# Patient Record
Sex: Female | Born: 1935 | Race: White | Hispanic: No | Marital: Married | Smoking: Former smoker
Health system: Southern US, Community
[De-identification: ages and names within clinical notes are randomized; demographics above are authoritative.]

## PROBLEM LIST (undated history)

## (undated) DIAGNOSIS — J449 Chronic obstructive pulmonary disease, unspecified: Secondary | ICD-10-CM

## (undated) DIAGNOSIS — M810 Age-related osteoporosis without current pathological fracture: Secondary | ICD-10-CM

## (undated) DIAGNOSIS — K279 Peptic ulcer, site unspecified, unspecified as acute or chronic, without hemorrhage or perforation: Secondary | ICD-10-CM

## (undated) DIAGNOSIS — F329 Major depressive disorder, single episode, unspecified: Secondary | ICD-10-CM

## (undated) DIAGNOSIS — J4489 Other specified chronic obstructive pulmonary disease: Secondary | ICD-10-CM

## (undated) DIAGNOSIS — F32A Depression, unspecified: Secondary | ICD-10-CM

## (undated) DIAGNOSIS — S4291XA Fracture of right shoulder girdle, part unspecified, initial encounter for closed fracture: Secondary | ICD-10-CM

## (undated) DIAGNOSIS — K5732 Diverticulitis of large intestine without perforation or abscess without bleeding: Secondary | ICD-10-CM

## (undated) DIAGNOSIS — Z9889 Other specified postprocedural states: Secondary | ICD-10-CM

## (undated) DIAGNOSIS — Z8679 Personal history of other diseases of the circulatory system: Secondary | ICD-10-CM

## (undated) DIAGNOSIS — K5730 Diverticulosis of large intestine without perforation or abscess without bleeding: Secondary | ICD-10-CM

## (undated) DIAGNOSIS — D6800 Von Willebrand disease, unspecified: Secondary | ICD-10-CM

## (undated) DIAGNOSIS — I509 Heart failure, unspecified: Secondary | ICD-10-CM

## (undated) DIAGNOSIS — E785 Hyperlipidemia, unspecified: Secondary | ICD-10-CM

## (undated) DIAGNOSIS — I10 Essential (primary) hypertension: Secondary | ICD-10-CM

## (undated) DIAGNOSIS — Z8719 Personal history of other diseases of the digestive system: Secondary | ICD-10-CM

## (undated) DIAGNOSIS — K635 Polyp of colon: Secondary | ICD-10-CM

## (undated) DIAGNOSIS — I2510 Atherosclerotic heart disease of native coronary artery without angina pectoris: Secondary | ICD-10-CM

## (undated) DIAGNOSIS — J45909 Unspecified asthma, uncomplicated: Secondary | ICD-10-CM

## (undated) DIAGNOSIS — K219 Gastro-esophageal reflux disease without esophagitis: Secondary | ICD-10-CM

## (undated) HISTORY — DX: Personal history of other diseases of the circulatory system: Z86.79

## (undated) HISTORY — PX: OTHER SURGICAL HISTORY: SHX169

## (undated) HISTORY — DX: Gastro-esophageal reflux disease without esophagitis: K21.9

## (undated) HISTORY — DX: Von Willebrand's disease: D68.0

## (undated) HISTORY — DX: Major depressive disorder, single episode, unspecified: F32.9

## (undated) HISTORY — DX: Chronic obstructive pulmonary disease, unspecified: J44.9

## (undated) HISTORY — DX: Other specified postprocedural states: Z98.890

## (undated) HISTORY — DX: Hyperlipidemia, unspecified: E78.5

## (undated) HISTORY — DX: Other specified chronic obstructive pulmonary disease: J44.89

## (undated) HISTORY — DX: Diverticulosis of large intestine without perforation or abscess without bleeding: K57.30

## (undated) HISTORY — DX: Von Willebrand disease, unspecified: D68.00

## (undated) HISTORY — DX: Age-related osteoporosis without current pathological fracture: M81.0

## (undated) HISTORY — DX: Depression, unspecified: F32.A

## (undated) HISTORY — DX: Personal history of other diseases of the digestive system: Z87.19

## (undated) HISTORY — DX: Atherosclerotic heart disease of native coronary artery without angina pectoris: I25.10

## (undated) HISTORY — DX: Essential (primary) hypertension: I10

---

## 1963-06-24 HISTORY — PX: APPENDECTOMY: SHX54

## 1963-06-24 HISTORY — PX: EXPLORATORY LAPAROTOMY: SUR591

## 1969-06-23 HISTORY — PX: CHOLECYSTECTOMY: SHX55

## 1969-06-23 HISTORY — PX: EXPLORATORY LAPAROTOMY: SUR591

## 1989-06-23 DIAGNOSIS — Z8679 Personal history of other diseases of the circulatory system: Secondary | ICD-10-CM

## 1989-06-23 HISTORY — DX: Personal history of other diseases of the circulatory system: Z86.79

## 1992-06-23 DIAGNOSIS — Z9889 Other specified postprocedural states: Secondary | ICD-10-CM

## 1992-06-23 HISTORY — DX: Other specified postprocedural states: Z98.890

## 1992-06-23 HISTORY — PX: CAROTID ENDARTERECTOMY: SUR193

## 1997-11-29 ENCOUNTER — Other Ambulatory Visit: Admission: RE | Admit: 1997-11-29 | Discharge: 1997-11-29 | Payer: Self-pay | Admitting: Cardiology

## 1998-07-23 ENCOUNTER — Other Ambulatory Visit: Admission: RE | Admit: 1998-07-23 | Discharge: 1998-07-23 | Payer: Self-pay | Admitting: *Deleted

## 1999-01-03 ENCOUNTER — Emergency Department (HOSPITAL_COMMUNITY): Admission: EM | Admit: 1999-01-03 | Discharge: 1999-01-03 | Payer: Self-pay | Admitting: Emergency Medicine

## 1999-07-29 ENCOUNTER — Other Ambulatory Visit: Admission: RE | Admit: 1999-07-29 | Discharge: 1999-07-29 | Payer: Self-pay | Admitting: *Deleted

## 2000-01-14 ENCOUNTER — Encounter: Payer: Self-pay | Admitting: *Deleted

## 2000-01-14 ENCOUNTER — Emergency Department (HOSPITAL_COMMUNITY): Admission: EM | Admit: 2000-01-14 | Discharge: 2000-01-14 | Payer: Self-pay | Admitting: *Deleted

## 2000-03-27 ENCOUNTER — Encounter: Admission: RE | Admit: 2000-03-27 | Discharge: 2000-03-27 | Payer: Self-pay

## 2000-05-16 ENCOUNTER — Emergency Department (HOSPITAL_COMMUNITY): Admission: EM | Admit: 2000-05-16 | Discharge: 2000-05-16 | Payer: Self-pay

## 2000-05-19 ENCOUNTER — Inpatient Hospital Stay (HOSPITAL_COMMUNITY): Admission: EM | Admit: 2000-05-19 | Discharge: 2000-05-21 | Payer: Self-pay | Admitting: Critical Care Medicine

## 2000-05-19 ENCOUNTER — Encounter: Payer: Self-pay | Admitting: Critical Care Medicine

## 2000-08-16 ENCOUNTER — Ambulatory Visit (HOSPITAL_BASED_OUTPATIENT_CLINIC_OR_DEPARTMENT_OTHER): Admission: RE | Admit: 2000-08-16 | Discharge: 2000-08-16 | Payer: Self-pay | Admitting: Critical Care Medicine

## 2000-08-19 ENCOUNTER — Other Ambulatory Visit: Admission: RE | Admit: 2000-08-19 | Discharge: 2000-08-19 | Payer: Self-pay | Admitting: *Deleted

## 2000-08-21 ENCOUNTER — Encounter: Payer: Self-pay | Admitting: *Deleted

## 2000-08-21 ENCOUNTER — Encounter: Admission: RE | Admit: 2000-08-21 | Discharge: 2000-08-21 | Payer: Self-pay | Admitting: *Deleted

## 2001-03-05 ENCOUNTER — Encounter: Payer: Self-pay | Admitting: Emergency Medicine

## 2001-03-05 ENCOUNTER — Inpatient Hospital Stay (HOSPITAL_COMMUNITY): Admission: EM | Admit: 2001-03-05 | Discharge: 2001-03-10 | Payer: Self-pay | Admitting: Emergency Medicine

## 2001-03-06 ENCOUNTER — Encounter: Payer: Self-pay | Admitting: Internal Medicine

## 2001-03-09 ENCOUNTER — Encounter: Payer: Self-pay | Admitting: Gastroenterology

## 2001-03-09 ENCOUNTER — Encounter: Payer: Self-pay | Admitting: Pulmonary Disease

## 2001-04-01 ENCOUNTER — Encounter: Admission: RE | Admit: 2001-04-01 | Discharge: 2001-04-01 | Payer: Self-pay

## 2001-04-06 ENCOUNTER — Encounter: Admission: RE | Admit: 2001-04-06 | Discharge: 2001-04-06 | Payer: Self-pay | Admitting: General Surgery

## 2001-04-06 ENCOUNTER — Encounter: Payer: Self-pay | Admitting: General Surgery

## 2001-04-14 ENCOUNTER — Encounter: Payer: Self-pay | Admitting: Internal Medicine

## 2001-04-14 ENCOUNTER — Ambulatory Visit (HOSPITAL_COMMUNITY): Admission: RE | Admit: 2001-04-14 | Discharge: 2001-04-14 | Payer: Self-pay | Admitting: Internal Medicine

## 2001-11-21 ENCOUNTER — Inpatient Hospital Stay (HOSPITAL_COMMUNITY): Admission: EM | Admit: 2001-11-21 | Discharge: 2001-11-23 | Payer: Self-pay

## 2002-04-07 ENCOUNTER — Encounter: Payer: Self-pay | Admitting: General Surgery

## 2002-04-07 ENCOUNTER — Encounter: Admission: RE | Admit: 2002-04-07 | Discharge: 2002-04-07 | Payer: Self-pay | Admitting: General Surgery

## 2002-05-30 ENCOUNTER — Ambulatory Visit (HOSPITAL_COMMUNITY): Admission: RE | Admit: 2002-05-30 | Discharge: 2002-05-30 | Payer: Self-pay | Admitting: Critical Care Medicine

## 2002-05-30 ENCOUNTER — Encounter: Payer: Self-pay | Admitting: Critical Care Medicine

## 2003-05-01 ENCOUNTER — Encounter: Admission: RE | Admit: 2003-05-01 | Discharge: 2003-05-01 | Payer: Self-pay | Admitting: General Surgery

## 2003-05-05 ENCOUNTER — Encounter: Admission: RE | Admit: 2003-05-05 | Discharge: 2003-05-05 | Payer: Self-pay | Admitting: General Surgery

## 2004-01-24 ENCOUNTER — Ambulatory Visit (HOSPITAL_COMMUNITY): Admission: RE | Admit: 2004-01-24 | Discharge: 2004-01-24 | Payer: Self-pay | Admitting: Internal Medicine

## 2004-01-25 ENCOUNTER — Encounter (INDEPENDENT_AMBULATORY_CARE_PROVIDER_SITE_OTHER): Payer: Self-pay | Admitting: Specialist

## 2004-04-14 ENCOUNTER — Inpatient Hospital Stay (HOSPITAL_COMMUNITY): Admission: EM | Admit: 2004-04-14 | Discharge: 2004-04-21 | Payer: Self-pay | Admitting: Emergency Medicine

## 2004-05-01 ENCOUNTER — Ambulatory Visit: Payer: Self-pay | Admitting: Critical Care Medicine

## 2004-06-10 ENCOUNTER — Ambulatory Visit: Payer: Self-pay | Admitting: Critical Care Medicine

## 2004-07-22 ENCOUNTER — Ambulatory Visit: Payer: Self-pay | Admitting: Critical Care Medicine

## 2004-08-23 ENCOUNTER — Ambulatory Visit: Payer: Self-pay | Admitting: Critical Care Medicine

## 2004-09-23 ENCOUNTER — Ambulatory Visit: Payer: Self-pay | Admitting: Critical Care Medicine

## 2004-09-25 ENCOUNTER — Ambulatory Visit: Payer: Self-pay | Admitting: Internal Medicine

## 2004-09-27 ENCOUNTER — Ambulatory Visit (HOSPITAL_COMMUNITY): Admission: RE | Admit: 2004-09-27 | Discharge: 2004-09-27 | Payer: Self-pay | Admitting: Internal Medicine

## 2004-11-01 ENCOUNTER — Ambulatory Visit: Payer: Self-pay | Admitting: Critical Care Medicine

## 2004-11-28 ENCOUNTER — Ambulatory Visit: Payer: Self-pay | Admitting: Critical Care Medicine

## 2005-01-30 ENCOUNTER — Ambulatory Visit: Payer: Self-pay | Admitting: Critical Care Medicine

## 2005-02-19 ENCOUNTER — Ambulatory Visit: Payer: Self-pay | Admitting: Internal Medicine

## 2005-02-27 ENCOUNTER — Ambulatory Visit: Payer: Self-pay | Admitting: Critical Care Medicine

## 2005-03-06 ENCOUNTER — Encounter: Payer: Self-pay | Admitting: Critical Care Medicine

## 2005-03-06 ENCOUNTER — Ambulatory Visit: Admission: RE | Admit: 2005-03-06 | Discharge: 2005-03-06 | Payer: Self-pay | Admitting: Critical Care Medicine

## 2005-04-02 ENCOUNTER — Ambulatory Visit: Payer: Self-pay | Admitting: Critical Care Medicine

## 2005-05-21 ENCOUNTER — Encounter: Admission: RE | Admit: 2005-05-21 | Discharge: 2005-05-21 | Payer: Self-pay | Admitting: General Surgery

## 2005-05-28 ENCOUNTER — Ambulatory Visit: Payer: Self-pay | Admitting: Critical Care Medicine

## 2005-06-26 ENCOUNTER — Ambulatory Visit: Payer: Self-pay | Admitting: Critical Care Medicine

## 2005-08-07 ENCOUNTER — Ambulatory Visit: Payer: Self-pay | Admitting: Critical Care Medicine

## 2005-10-23 ENCOUNTER — Ambulatory Visit: Payer: Self-pay | Admitting: Critical Care Medicine

## 2005-12-08 ENCOUNTER — Ambulatory Visit: Payer: Self-pay | Admitting: Critical Care Medicine

## 2006-02-04 ENCOUNTER — Ambulatory Visit: Payer: Self-pay | Admitting: Critical Care Medicine

## 2006-03-06 ENCOUNTER — Ambulatory Visit: Payer: Self-pay | Admitting: Critical Care Medicine

## 2006-03-24 ENCOUNTER — Ambulatory Visit: Payer: Self-pay | Admitting: Critical Care Medicine

## 2006-04-23 ENCOUNTER — Ambulatory Visit: Payer: Self-pay | Admitting: Critical Care Medicine

## 2006-05-22 ENCOUNTER — Encounter: Admission: RE | Admit: 2006-05-22 | Discharge: 2006-05-22 | Payer: Self-pay | Admitting: General Surgery

## 2006-06-29 ENCOUNTER — Ambulatory Visit: Payer: Self-pay | Admitting: Critical Care Medicine

## 2006-08-28 ENCOUNTER — Ambulatory Visit: Payer: Self-pay | Admitting: Critical Care Medicine

## 2006-10-12 ENCOUNTER — Ambulatory Visit: Payer: Self-pay | Admitting: Critical Care Medicine

## 2006-11-05 ENCOUNTER — Ambulatory Visit: Payer: Self-pay | Admitting: Critical Care Medicine

## 2006-12-08 ENCOUNTER — Ambulatory Visit: Payer: Self-pay | Admitting: Critical Care Medicine

## 2007-02-08 ENCOUNTER — Ambulatory Visit: Payer: Self-pay | Admitting: Critical Care Medicine

## 2007-03-25 DIAGNOSIS — D680 Von Willebrand's disease: Secondary | ICD-10-CM

## 2007-03-25 DIAGNOSIS — K219 Gastro-esophageal reflux disease without esophagitis: Secondary | ICD-10-CM

## 2007-03-25 DIAGNOSIS — I2510 Atherosclerotic heart disease of native coronary artery without angina pectoris: Secondary | ICD-10-CM | POA: Insufficient documentation

## 2007-03-25 DIAGNOSIS — I119 Hypertensive heart disease without heart failure: Secondary | ICD-10-CM

## 2007-03-25 DIAGNOSIS — Z860100 Personal history of colon polyps, unspecified: Secondary | ICD-10-CM | POA: Insufficient documentation

## 2007-03-25 DIAGNOSIS — J4489 Other specified chronic obstructive pulmonary disease: Secondary | ICD-10-CM | POA: Insufficient documentation

## 2007-03-25 DIAGNOSIS — Z8679 Personal history of other diseases of the circulatory system: Secondary | ICD-10-CM | POA: Insufficient documentation

## 2007-03-25 DIAGNOSIS — K5730 Diverticulosis of large intestine without perforation or abscess without bleeding: Secondary | ICD-10-CM | POA: Insufficient documentation

## 2007-03-25 DIAGNOSIS — J449 Chronic obstructive pulmonary disease, unspecified: Secondary | ICD-10-CM

## 2007-03-25 DIAGNOSIS — M810 Age-related osteoporosis without current pathological fracture: Secondary | ICD-10-CM | POA: Insufficient documentation

## 2007-03-25 DIAGNOSIS — Z9089 Acquired absence of other organs: Secondary | ICD-10-CM | POA: Insufficient documentation

## 2007-03-30 ENCOUNTER — Ambulatory Visit: Payer: Self-pay | Admitting: Critical Care Medicine

## 2007-06-11 ENCOUNTER — Telehealth (INDEPENDENT_AMBULATORY_CARE_PROVIDER_SITE_OTHER): Payer: Self-pay | Admitting: *Deleted

## 2007-06-11 ENCOUNTER — Ambulatory Visit: Payer: Self-pay | Admitting: Critical Care Medicine

## 2007-06-11 DIAGNOSIS — F4322 Adjustment disorder with anxiety: Secondary | ICD-10-CM

## 2007-06-11 DIAGNOSIS — Z8719 Personal history of other diseases of the digestive system: Secondary | ICD-10-CM

## 2007-08-19 ENCOUNTER — Encounter: Payer: Self-pay | Admitting: Critical Care Medicine

## 2007-09-02 ENCOUNTER — Encounter: Payer: Self-pay | Admitting: Critical Care Medicine

## 2007-09-06 ENCOUNTER — Encounter: Payer: Self-pay | Admitting: Critical Care Medicine

## 2007-09-07 ENCOUNTER — Ambulatory Visit: Payer: Self-pay | Admitting: Critical Care Medicine

## 2007-10-18 ENCOUNTER — Ambulatory Visit: Payer: Self-pay | Admitting: Vascular Surgery

## 2007-10-22 ENCOUNTER — Ambulatory Visit: Payer: Self-pay | Admitting: Vascular Surgery

## 2007-10-28 ENCOUNTER — Encounter: Payer: Self-pay | Admitting: Critical Care Medicine

## 2007-11-16 ENCOUNTER — Ambulatory Visit: Payer: Self-pay | Admitting: Critical Care Medicine

## 2008-01-21 ENCOUNTER — Ambulatory Visit: Payer: Self-pay | Admitting: Critical Care Medicine

## 2008-03-20 ENCOUNTER — Ambulatory Visit: Payer: Self-pay | Admitting: Critical Care Medicine

## 2008-03-22 ENCOUNTER — Telehealth (INDEPENDENT_AMBULATORY_CARE_PROVIDER_SITE_OTHER): Payer: Self-pay | Admitting: *Deleted

## 2008-04-19 ENCOUNTER — Ambulatory Visit: Payer: Self-pay | Admitting: Critical Care Medicine

## 2008-06-26 ENCOUNTER — Encounter: Payer: Self-pay | Admitting: Critical Care Medicine

## 2008-07-18 ENCOUNTER — Ambulatory Visit: Payer: Self-pay | Admitting: Critical Care Medicine

## 2008-07-18 DIAGNOSIS — R339 Retention of urine, unspecified: Secondary | ICD-10-CM

## 2008-07-19 LAB — CONVERTED CEMR LAB
Basophils Absolute: 0 10*3/uL (ref 0.0–0.1)
Basophils Relative: 0.4 % (ref 0.0–3.0)
Bilirubin Urine: NEGATIVE
Crystals: NEGATIVE
Eosinophils Absolute: 0.2 10*3/uL (ref 0.0–0.7)
Eosinophils Relative: 3 % (ref 0.0–5.0)
HCT: 34.5 % — ABNORMAL LOW (ref 36.0–46.0)
Hemoglobin, Urine: NEGATIVE
Hemoglobin: 12.1 g/dL (ref 12.0–15.0)
Ketones, ur: NEGATIVE mg/dL
Leukocytes, UA: NEGATIVE
Lymphocytes Relative: 25.9 % (ref 12.0–46.0)
MCHC: 35 g/dL (ref 30.0–36.0)
MCV: 93.9 fL (ref 78.0–100.0)
Monocytes Absolute: 0.7 10*3/uL (ref 0.1–1.0)
Monocytes Relative: 9 % (ref 3.0–12.0)
Mucus, UA: NEGATIVE
Neutro Abs: 4.7 10*3/uL (ref 1.4–7.7)
Neutrophils Relative %: 61.7 % (ref 43.0–77.0)
Nitrite: NEGATIVE
Platelets: 221 10*3/uL (ref 150–400)
RBC / HPF: NONE SEEN
RBC: 3.67 M/uL — ABNORMAL LOW (ref 3.87–5.11)
RDW: 11.8 % (ref 11.5–14.6)
Specific Gravity, Urine: <=1.005 (ref 1.000–1.03)
Total Protein, Urine: NEGATIVE mg/dL
Urine Glucose: NEGATIVE mg/dL
Urobilinogen, UA: 0.2 (ref 0.0–1.0)
WBC: 7.6 10*3/uL (ref 4.5–10.5)
pH: 6.5 (ref 5.0–8.0)

## 2008-07-20 ENCOUNTER — Ambulatory Visit (HOSPITAL_COMMUNITY): Admission: RE | Admit: 2008-07-20 | Discharge: 2008-07-20 | Payer: Self-pay | Admitting: Critical Care Medicine

## 2008-08-18 ENCOUNTER — Ambulatory Visit: Payer: Self-pay | Admitting: Critical Care Medicine

## 2008-08-22 ENCOUNTER — Telehealth (INDEPENDENT_AMBULATORY_CARE_PROVIDER_SITE_OTHER): Payer: Self-pay

## 2008-09-04 ENCOUNTER — Encounter: Payer: Self-pay | Admitting: Critical Care Medicine

## 2008-10-10 ENCOUNTER — Ambulatory Visit: Payer: Self-pay | Admitting: Critical Care Medicine

## 2008-11-24 ENCOUNTER — Encounter: Payer: Self-pay | Admitting: Critical Care Medicine

## 2008-11-29 ENCOUNTER — Ambulatory Visit: Payer: Self-pay | Admitting: Critical Care Medicine

## 2008-12-21 ENCOUNTER — Encounter: Payer: Self-pay | Admitting: Critical Care Medicine

## 2008-12-29 ENCOUNTER — Ambulatory Visit: Payer: Self-pay | Admitting: Critical Care Medicine

## 2009-02-05 ENCOUNTER — Ambulatory Visit: Payer: Self-pay | Admitting: Critical Care Medicine

## 2009-02-12 ENCOUNTER — Encounter: Payer: Self-pay | Admitting: Critical Care Medicine

## 2009-02-12 ENCOUNTER — Telehealth: Payer: Self-pay | Admitting: Internal Medicine

## 2009-02-13 ENCOUNTER — Encounter: Payer: Self-pay | Admitting: Internal Medicine

## 2009-02-14 ENCOUNTER — Encounter: Payer: Self-pay | Admitting: Critical Care Medicine

## 2009-02-16 ENCOUNTER — Encounter: Payer: Self-pay | Admitting: Internal Medicine

## 2009-03-20 ENCOUNTER — Ambulatory Visit: Payer: Self-pay | Admitting: Critical Care Medicine

## 2009-03-23 ENCOUNTER — Encounter: Payer: Self-pay | Admitting: Critical Care Medicine

## 2009-03-26 ENCOUNTER — Encounter: Payer: Self-pay | Admitting: Internal Medicine

## 2009-03-28 ENCOUNTER — Encounter: Payer: Self-pay | Admitting: Internal Medicine

## 2009-04-02 ENCOUNTER — Telehealth: Payer: Self-pay | Admitting: Critical Care Medicine

## 2009-04-04 ENCOUNTER — Encounter: Payer: Self-pay | Admitting: Critical Care Medicine

## 2009-04-05 ENCOUNTER — Encounter: Payer: Self-pay | Admitting: Critical Care Medicine

## 2009-04-06 ENCOUNTER — Encounter: Payer: Self-pay | Admitting: Critical Care Medicine

## 2009-04-10 ENCOUNTER — Telehealth (INDEPENDENT_AMBULATORY_CARE_PROVIDER_SITE_OTHER): Payer: Self-pay | Admitting: *Deleted

## 2009-04-23 ENCOUNTER — Ambulatory Visit: Payer: Self-pay | Admitting: Critical Care Medicine

## 2009-04-25 LAB — CONVERTED CEMR LAB
BUN: 14 mg/dL (ref 6–23)
CO2: 36 meq/L — ABNORMAL HIGH (ref 19–32)
Calcium: 8.5 mg/dL (ref 8.4–10.5)
Chloride: 100 meq/L (ref 96–112)
Creatinine, Ser: 0.9 mg/dL (ref 0.4–1.2)
GFR calc non Af Amer: 64.19 mL/min (ref >60)
Glucose, Bld: 104 mg/dL — ABNORMAL HIGH (ref 70–99)
Magnesium: 1.8 mg/dL (ref 1.5–2.5)
Potassium: 3.7 meq/L (ref 3.5–5.1)
Sodium: 141 meq/L (ref 135–145)

## 2009-05-29 ENCOUNTER — Ambulatory Visit: Payer: Self-pay | Admitting: Critical Care Medicine

## 2009-06-01 ENCOUNTER — Telehealth (INDEPENDENT_AMBULATORY_CARE_PROVIDER_SITE_OTHER): Payer: Self-pay | Admitting: *Deleted

## 2009-06-11 ENCOUNTER — Encounter: Payer: Self-pay | Admitting: Critical Care Medicine

## 2009-06-18 ENCOUNTER — Encounter: Payer: Self-pay | Admitting: Critical Care Medicine

## 2009-07-18 ENCOUNTER — Ambulatory Visit: Payer: Self-pay | Admitting: Critical Care Medicine

## 2009-07-27 ENCOUNTER — Encounter: Payer: Self-pay | Admitting: Critical Care Medicine

## 2009-08-03 ENCOUNTER — Encounter: Payer: Self-pay | Admitting: Critical Care Medicine

## 2009-09-04 ENCOUNTER — Ambulatory Visit: Payer: Self-pay | Admitting: Critical Care Medicine

## 2009-09-13 ENCOUNTER — Telehealth: Payer: Self-pay | Admitting: Critical Care Medicine

## 2009-09-18 ENCOUNTER — Telehealth: Payer: Self-pay | Admitting: Critical Care Medicine

## 2009-10-02 ENCOUNTER — Encounter: Payer: Self-pay | Admitting: Critical Care Medicine

## 2009-10-16 ENCOUNTER — Encounter: Payer: Self-pay | Admitting: Critical Care Medicine

## 2009-11-06 ENCOUNTER — Ambulatory Visit: Payer: Self-pay | Admitting: Critical Care Medicine

## 2009-11-29 ENCOUNTER — Telehealth (INDEPENDENT_AMBULATORY_CARE_PROVIDER_SITE_OTHER): Payer: Self-pay | Admitting: *Deleted

## 2009-11-30 ENCOUNTER — Telehealth (INDEPENDENT_AMBULATORY_CARE_PROVIDER_SITE_OTHER): Payer: Self-pay | Admitting: *Deleted

## 2009-12-05 ENCOUNTER — Encounter: Payer: Self-pay | Admitting: Critical Care Medicine

## 2010-01-04 ENCOUNTER — Telehealth (INDEPENDENT_AMBULATORY_CARE_PROVIDER_SITE_OTHER): Payer: Self-pay | Admitting: *Deleted

## 2010-01-08 ENCOUNTER — Ambulatory Visit: Payer: Self-pay | Admitting: Critical Care Medicine

## 2010-01-16 ENCOUNTER — Encounter: Payer: Self-pay | Admitting: Critical Care Medicine

## 2010-01-24 ENCOUNTER — Encounter: Payer: Self-pay | Admitting: Critical Care Medicine

## 2010-01-30 ENCOUNTER — Encounter: Payer: Self-pay | Admitting: Critical Care Medicine

## 2010-02-05 ENCOUNTER — Ambulatory Visit: Payer: Self-pay | Admitting: Critical Care Medicine

## 2010-02-06 ENCOUNTER — Encounter: Payer: Self-pay | Admitting: Critical Care Medicine

## 2010-03-01 ENCOUNTER — Telehealth: Payer: Self-pay | Admitting: Critical Care Medicine

## 2010-03-18 ENCOUNTER — Ambulatory Visit: Payer: Self-pay | Admitting: Cardiology

## 2010-03-19 ENCOUNTER — Ambulatory Visit: Payer: Self-pay | Admitting: Critical Care Medicine

## 2010-04-01 ENCOUNTER — Encounter: Payer: Self-pay | Admitting: Critical Care Medicine

## 2010-04-19 ENCOUNTER — Ambulatory Visit: Payer: Self-pay | Admitting: Critical Care Medicine

## 2010-05-28 ENCOUNTER — Ambulatory Visit: Payer: Self-pay | Admitting: Critical Care Medicine

## 2010-06-03 ENCOUNTER — Encounter: Payer: Self-pay | Admitting: Critical Care Medicine

## 2010-07-04 ENCOUNTER — Telehealth (INDEPENDENT_AMBULATORY_CARE_PROVIDER_SITE_OTHER): Payer: Self-pay | Admitting: *Deleted

## 2010-07-10 ENCOUNTER — Ambulatory Visit
Admission: RE | Admit: 2010-07-10 | Discharge: 2010-07-10 | Payer: Self-pay | Source: Home / Self Care | Attending: Critical Care Medicine | Admitting: Critical Care Medicine

## 2010-07-14 ENCOUNTER — Encounter: Payer: Self-pay | Admitting: Internal Medicine

## 2010-07-14 ENCOUNTER — Encounter: Payer: Self-pay | Admitting: General Surgery

## 2010-07-15 ENCOUNTER — Encounter: Payer: Self-pay | Admitting: Critical Care Medicine

## 2010-07-22 ENCOUNTER — Ambulatory Visit: Payer: Self-pay | Admitting: Cardiology

## 2010-07-25 NOTE — Progress Notes (Signed)
Summary: [name] with Hospice  Phone Note From Other Clinic   Caller: [facility] with [facility] Call For: Dr. Delford Field Reason for Call: Diagnosis Check Summary of Call: [name] with Hospice called she wants to ask wheither he thinks mucomyst would help becasue she had to turn her ()2 up to 4 and she seems to be coughing alot but is having problems getting anything up because it is so thick and she thouhgt that this would help to thin it out. If he thinks this would help she wants to know if he would be willing to order it for her and they will show her how to use it. [name] can be reached [phone] Initial call taken by: Vedia Coffer,  July 04, 2010 8:59 AM  Follow-up for Phone Call        spoke with [name], hospice nurse, and she staes the pt has been having some increased SOB and a dry cough. She states her lungs sound "tight" and the pt also c/o feeling chest tightness. [name] increased pt O2 to 4 liters. She states the pt states seh feel slike she needs to cough something up but she cannot get phlegm to come up. [name] is requesting an RX for mucomyst neb medicine to help thin out the pt phlegm so she can cough it up. The pt is taking tussin with guafenisen without relief. [name] states they will instruct the pt on how to use the mucomyst. Please advise. Carron Curie CMA  July 04, 2010 9:43 AM [address]  Additional Follow-up for Phone Call Additional follow up Details #1::        no this will make it worse I would try robitussin DM by mouth qid  over the counter  Additional Follow-up by: Storm Frisk MD,  July 04, 2010 10:11 AM    Additional Follow-up for Phone Call Additional follow up Details #2::    [name] at [facility] informed of Dr Lynelle Doctor recommendations. Abigail Miyamoto RN  July 04, 2010 10:26 AM

## 2010-07-25 NOTE — Progress Notes (Signed)
Summary: albuterol nubulizer  Phone Note Call from Patient Call back at [phone]   Caller: Patient Call For: wright Summary of Call: calling to talk to nurse about adding albuterol nebulizer Initial call taken by: Rickard Patience,  September 13, 2009 2:57 PM  Follow-up for Phone Call        Victorino Dike, pt hospice nurse states that Dr. Delanna Notice saw pt yesterday and pt was stating that she is having difficulty inhaling her proair and spiriva properly. He was suggesting that maybe the pt could either cont Spiriva and use albuterol neb as needed while she was at home, but keep the proair for when she was out of the house..or stop spiriva and use duoneb at home. Please advise on recs. Thanks. Carron Curie CMA  September 13, 2009 3:41 PM   Additional Follow-up for Phone Call Additional follow up Details #1::        she should stay on spiriva I am ok wiht getting her an albuterol 2.5mg  qid as needed in nebulizer per [facility] Additional Follow-up by: Storm Frisk MD,  September 13, 2009 3:49 PM    Additional Follow-up for Phone Call Additional follow up Details #2::    Victorino Dike at hospice notifed and she request rx be sent to [facility] [address]. RX sent. Victorino Dike will notify pt. Carron Curie CMA  September 13, 2009 4:25 PM   New/Updated Medications: ALBUTEROL SULFATE (2.5 MG/3ML) 0.083% NEBU (ALBUTEROL SULFATE) four times a day as needed Prescriptions: ALBUTEROL SULFATE (2.5 MG/3ML) 0.083% NEBU (ALBUTEROL SULFATE) four times a day as needed  #120 x 3   Entered by:   Carron Curie CMA   Authorized by:   Storm Frisk MD   Signed by:   Carron Curie CMA on 09/13/2009   Method used:   [facility]  [address] [id number]       [address]       [place], [place]  [address]       Ph: [phone] or [phone]       Fax: [phone]   RxID:   [id number]

## 2010-07-25 NOTE — Assessment & Plan Note (Signed)
Summary: Pulmonary OV   Primary Provider/Referring Provider:  Cassell Clement  CC:  2 month follow up.  Pt states there are times breathing is worse and times it is the same.  States she does have wheezing and chest tightness at times.  Cough-occ prod with light beige mucus.  .  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  The patient has chronic abdominal distention from partial small bowel dysfunction, and obstruction.  Pt now in Hospice   Nov 06, 2009 2:40 PM Two month f/u   Pt notes she is sleeping all the time and turned up oxygen to 3L The pt uses the walker,  if standing will lose balance and fall to side.  Hospice remains helpful. Pt denies any significant sore throat, nasal congestion or excess secretions, fever, chills, sweats, unintended weight loss, pleurtic or exertional chest pain, orthopnea PND, or leg swelling Pt denies any increase in rescue therapy over baseline, denies waking up needing it or having any early am or nocturnal exacerbations of coughing/wheezing/or dyspnea.   January 08, 2010 2:06 PM f/u copd.  Pt notes in throat will gurgle.  Then also noting more memory loss.  Forgetting where she is putting items that are important.  No change in weakness.  The pt did note some tightness in the chest. No change in vision.  Notes some edema in the feet.  Feet continue to swell.  Dyspnea sl worse to same. Notes some mucus, this will occasionally come up on its own.  Preventive Screening-Counseling & Management  Alcohol-Tobacco     Smoking Status: quit > 6 months     Year Quit: 1973     Pack years: 32  Current Medications (verified): 1)  Brovana 15 Mcg/24ml  Nebu (Arformoterol Tartrate) .... One in [place] Two Times A Day 2)  Nexium 40 Mg  Cpdr (Esomeprazole Magnesium) .... One By Mouth Once A Day 3)  Nitro-Dur 0.4 Mg/hr  Pt24 (Nitroglycerin) .... One  On Skin Once A Day 4)  Metoprolol Tartrate 25 Mg  Tabs (Metoprolol Tartrate) .... Two Times A Day 5)  Imdur 60 Mg  Tb24 (Isosorbide Mononitrate) .... 1/2 By Mouth Once Daily 6)  Paxil 10 Mg  Tabs (Paroxetine Hcl) .... One By Mouth Once Daily 7)  Spiriva Handihaler 18 Mcg  Caps (Tiotropium Bromide Monohydrate) .... One Capsule in Handihaler Once Daily 8)  Diovan 160 Mg Tabs (Valsartan) .... Take 1 Tablet By Mouth Once A Day 9)  Proair Hfa 108 (90 Base) Mcg/act  Aers (Albuterol Sulfate) .Marland Kitchen.. 1-2 Puffs Every 4-6 Hours As Needed 10)  Nitrostat 0.4 Mg  Subl (Nitroglycerin) .... One Sl As Needed 11)  Polyethylene Glycol 3350  Oral Powd (Polyethylene Glycol 3350) .... Once Daily 12)  Furosemide 40 Mg Tabs (Furosemide) .Marland Kitchen.. 1 By Mouth Daily 13)  Oxygen .Marland Kitchen.. 3l Cont 14)  Tylenol 325 Mg Tabs (Acetaminophen) .... As Needed 15)  Delsym 30 Mg/27ml Lqcr (Dextromethorphan Polistirex) .... Every 12 Hours As Needed As Directed 16)  Alprazolam 1 Mg Tabs (Alprazolam) .... Three Times A Day 17)  Lovastatin 40 Mg Tabs (Lovastatin) .... Once Daily 18)  Norvasc 5 Mg Tabs (Amlodipine Besylate) .... Once Daily 19)  Albuterol Sulfate (2.5 Mg/79ml) 0.083% Nebu (Albuterol Sulfate) .... Four Times A Day As Needed 20)  Flonase 50 Mcg/act Susp (Fluticasone Propionate) .... 2 Sprays Each Nostril At Bedtime 21)  Calcium .... Once Daily  22)  Multivitamins  Tabs (Multiple Vitamin) .... Once Daily  Allergies (verified): 1)  ! Cipro  Past History:  Past medical, surgical, family and social histories (including risk factors) reviewed, and no changes noted (except as noted below).  Past Medical History: Reviewed history from 06/11/2007 and no changes required. 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 non contrib  Social History: Reviewed history from 06/11/2007 and no changes required. Patient states former smoker.   Review of Systems       The patient complains of shortness of breath with activity, shortness of breath at rest, and non-productive cough.  The patient denies productive cough, coughing up blood, chest pain, irregular heartbeats, acid heartburn, indigestion, loss of appetite, weight change, abdominal pain, difficulty swallowing, sore throat, tooth/dental problems, headaches, nasal congestion/difficulty breathing through nose, sneezing, itching, ear ache, anxiety, depression, hand/feet swelling, joint stiffness or pain, rash, change in color of mucus, and fever.    Vital Signs:  Patient profile:   77 year old female Height:      59 inches Weight:      150.13 pounds BMI:     30.43 O2 Sat:      88 % on 3 L/mincont Temp:     97.9 degrees F oral Pulse rate:   91 / minute BP sitting:   98 / 58  (left arm) Cuff size:   regular  Vitals Entered By: Gweneth Dimitri RN (January 08, 2010 1:49 PM)  O2 Flow:  3 L/mincont  O2 Sat Comments Pt arrived to exam room with o2 sat of 88% on 3L cont.  After resting, o2 sat increased to 94% 3L cont with pulse of 68.  Gweneth Dimitri RN  January 08, 2010 1:54 PM   Serial Vital Signs/Assessments:  Comments: 3:52 PM Ambulatory Pulse Oximetry  Resting; HR_62____    02 Sat__97% 3L cont___  Lap1 (185 feet)   HR_____   02 Sat_____ Lap2 (185 feet)   HR_____   02 Sat_____    Lap3 (185 feet)    HR_____   02 Sat_____  ___Test Completed without Difficulty _x__Test Stopped due to:      Pt o2 sat decresed to 87% on 3L cont approx 1/4 of 1st lap.  o2 was then increased to 4L cont, and after sats increased to 91% pt attempted walk again.  At approx 1/4 of 1st lap, o2 sat decreased to 84% on 4L cont.  Pt's o2 was increased at that time to 5L cont.  Pt rested for a few minutes, o2 sat increased to 94%--pt then attempted walk again.  Walk was then stopped at 3/4 of 1st lap because pt became light headed and dizzy.  At that time, o2 sat dropped to 85%.  Pt rested for a few minutes, o2 sat increased to 94% 5L cont.   Gweneth Dimitri RN  January 08, 2010 3:52 PM  By: Gweneth Dimitri RN   CC: 2 month follow up.  Pt  states there are times breathing is worse and times it is the same.  States she does have wheezing and chest tightness at times.  Cough-occ prod with light beige mucus.   Comments Medications reviewed with patient Daytime contact number verified with patient. Gweneth Dimitri RN  January 08, 2010 1:49 PM    Physical Exam  Additional Exam:  Gen Pt  in no distress , normal affect ENT: no lesions, no post nasal drip Neck: No JVD, no TMG, no carotid bruits Lungs: No use of accessory muscles, no dullness to percussion, distant breath sounds, poor airflow Cardiovascular: RRR, heart sounds normal, no murmurs or gallops, no peripheral edema Abdomen: soft and non-tender, no HSM, high pitched rush sound c/w partial bowel obstruction Musculoskeletal: No deformities, no cyanosis or clubbing Neuro: alert, non-focal     Impression & Recommendations:  Problem # 1:  COPD (ICD-496) Assessment Unchanged  COPD Golds Stage IV mild flare plan pulse pred cont with hospice no change in inhaled meds   Medications Added to Medication List This Visit: 1)  Oxygen  .Marland Kitchen.. 3l rest 5 l exertion 2)  Alprazolam 1 Mg Tabs (Alprazolam) .... Three times a day 3)  Prednisone 10 Mg Tabs (Prednisone) .... Take as  directed 4 each am x3days, 3 x 3days, 2 x 3days, 1 x 3days then stop  Complete Medication List: 1)  Brovana 15 Mcg/33ml Nebu (Arformoterol tartrate) .... One in neb two times a day 2)  Nexium 40 Mg Cpdr (Esomeprazole magnesium) .... One by mouth once a day 3)  Nitro-dur 0.4 Mg/hr Pt24 (Nitroglycerin) .... One on skin once a day 4)  Metoprolol Tartrate 25 Mg Tabs (Metoprolol tartrate) .... Two times a day 5)  Imdur 60 Mg Tb24 (Isosorbide mononitrate) .... 1/2 by mouth once daily 6)  Paxil 10 Mg Tabs (Paroxetine hcl) .... One by mouth once daily 7)  Spiriva Handihaler 18 Mcg Caps (Tiotropium bromide monohydrate) .... One capsule in handihaler once daily 8)  Diovan 160 Mg Tabs (Valsartan) .... Take 1 tablet by mouth once a day 9)  Proair Hfa 108 (90 Base) Mcg/act Aers (Albuterol sulfate) .Marland Kitchen.. 1-2 puffs every 4-6 hours as needed 10)  Nitrostat 0.4 Mg Subl (Nitroglycerin) .... One sl as needed 11)  Polyethylene Glycol 3350 Oral Powd (Polyethylene glycol 3350) .... Once daily 12)  Furosemide 40 Mg Tabs (Furosemide) .Marland Kitchen.. 1 by mouth daily 13)  Oxygen  .Marland Kitchen.. 3l rest 5 l exertion 14)  Tylenol 325 Mg Tabs (Acetaminophen) .... As needed 15)  Delsym 30 Mg/47ml Lqcr (Dextromethorphan polistirex) .... Every 12 hours as needed as directed 16)  Alprazolam 1 Mg Tabs (Alprazolam) .... Three times a day 17)  Lovastatin 40 Mg Tabs (Lovastatin) .... Once daily 18)  Norvasc 5 Mg Tabs (Amlodipine besylate) .... Once daily 19)  Albuterol Sulfate (2.5 Mg/62ml) 0.083% Nebu (Albuterol sulfate) .... Four times a day as needed 20)  Flonase 50 Mcg/act Susp (Fluticasone propionate) .... 2 sprays each nostril at bedtime 21)  Calcium  .... Once daily 22)  Multivitamins Tabs (Multiple vitamin) .... Once daily 23)  Prednisone 10 Mg Tabs (Prednisone) .... Take as directed 4 each am x3days, 3 x 3days, 2 x 3days, 1 x 3days then stop  Other Orders: Pulse Oximetry, Ambulatory (59563) DME Referral (DME)  Patient Instructions: 1)   Prednisone 10mg  4 each am x3days, 3 x 3days, 2 x 3days, 1 x 3days then stop 2)  Increase oxygen to 3Liter rest 5-6L exertion 3)  No other changes 4)  Return 1 month Prescriptions: PREDNISONE 10 MG  TABS (PREDNISONE) Take as directed 4 each am x3days, 3 x 3days, 2 x 3days, 1 x 3days then stop  #30 x 0   Entered and Authorized by:   Storm Frisk MD   Signed by:   Storm Frisk MD on 01/08/2010   Method used:   [facility]  [address] [id number]       [address]       [place], [place]  [address]       Ph: [phone] or [phone]       Fax: [phone]   RxID:   [id number]

## 2010-07-25 NOTE — Assessment & Plan Note (Signed)
Summary: Pulmonary OV   Primary Provider/Referring Provider:  Cassell Clement  CC:  6 week COPD follow up.  Pt states she has days where breathing is worse and days when it's good.  states when she is tired she has increased SOB and at times she feels like she "cannot get a deep breath."  states she does have a cough - occasionally prod with clear mucus and occasionally dry.  .  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  The patient has chronic abdominal distention from partial small bowel dysfunction, and obstruction.  Pt now in Hospice  September 04, 2009 11:41 AM F/u , remains very fatigued.  Bowels ok but sore in RLQ.  Stools are semiformed.  Notes some cough and mucous,  mucous is white.  Dyspnea is the same compared to before.  No chest pain.  Norvasc helped BP  Current Medications (verified): 1)  Brovana 15 Mcg/48ml  Nebu (Arformoterol Tartrate) .... One in [place] Two Times A Day 2)  Nexium 40 Mg  Cpdr (Esomeprazole Magnesium) .... One By Mouth Once A Day 3)  Nitro-Dur 0.4 Mg/hr  Pt24 (Nitroglycerin) .... One On Skin Once A Day 4)  Metoprolol Tartrate 25 Mg  Tabs (Metoprolol Tartrate) .... Two Times A Day 5)  Imdur 60 Mg  Tb24 (Isosorbide Mononitrate) .... 1/2 By Mouth Once Daily 6)  Paxil 10 Mg  Tabs (Paroxetine Hcl) .... One By Mouth Once Daily 7)  Spiriva Handihaler 18 Mcg  Caps (Tiotropium Bromide Monohydrate) .... One Capsule in Handihaler Once Daily 8)  Diovan 160 Mg Tabs (Valsartan) .... Take 1 Tablet By Mouth Once A Day 9)  Proair Hfa 108 (90 Base) Mcg/act  Aers (Albuterol Sulfate) .Marland Kitchen.. 1-2 Puffs Every 4-6 Hours As Needed 10)  Nitrostat 0.4 Mg  Subl (Nitroglycerin) .... One Sl As Needed 11)  Polyethylene Glycol 3350  Oral Powd (Polyethylene Glycol 3350) .... Once Daily 12)  Furosemide 40 Mg Tabs (Furosemide) .Marland Kitchen.. 1 By Mouth Daily 13)  Oxygen .... 2.5l  Continuous 14)  Tylenol 325 Mg Tabs (Acetaminophen) .... As Needed 15)  Delsym 30 Mg/65ml Lqcr (Dextromethorphan Polistirex) .... Every 12 Hours As Needed As Directed 16)  Alprazolam 1 Mg Tabs (Alprazolam) .... Three Times A Day As Needed 17)  Lovastatin 40 Mg Tabs (Lovastatin) .... Once Daily 18)  Norvasc 5 Mg Tabs (Amlodipine Besylate) .... Once Daily  Allergies (verified): 1)  ! Cipro  Past History:  Past medical, surgical, family and social histories (including risk factors) reviewed, and no changes noted (except as noted below).  Past Medical History: Reviewed history from 06/11/2007 and no changes required. 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 non contrib  Social History: Reviewed history from 06/11/2007 and no changes required. Patient states former smoker.   Review of Systems       The patient complains of shortness of breath  with activity, productive cough, and abdominal pain.  The patient denies shortness of breath at rest, non-productive cough, coughing up blood, chest pain, irregular heartbeats, acid heartburn, indigestion, loss of appetite, weight change, difficulty swallowing, sore throat, tooth/dental problems, headaches, nasal congestion/difficulty breathing through nose, sneezing, itching,  ear ache, anxiety, depression, hand/feet swelling, joint stiffness or pain, rash, change in color of mucus, and fever.    Vital Signs:  Patient profile:   77 year old female Height:      59 inches Weight:      154 pounds BMI:     31.22 O2 Sat:      87 % on 3 L/mincont Temp:     97.5 degrees F oral Pulse rate:   74 / minute BP sitting:   128 / 64  (right arm) Cuff size:   regular  Vitals Entered By: Gweneth Dimitri RN (September 04, 2009 11:29 AM)  O2 Flow:  3 L/mincont  O2 Sat Comments Pt arrived to exam room with o2 sat 87% on 3L cont.  After resting for a few minutes, o2 sat increased to 92% on 3L cont with pulse of 75.  Gweneth Dimitri RN  September 04, 2009 11:34 AM  CC: 6 week COPD follow up.  Pt states she has days where breathing is worse and days when it's good.  states when she is tired she has increased SOB and at times she feels like she "cannot get a deep breath."  states she does have a cough - occasionally prod with clear mucus and occasionally dry.   Comments Medications reviewed with patient Daytime contact number verified with patient. Gweneth Dimitri RN  September 04, 2009 11:33 AM    Physical Exam  Additional Exam:  Gen: Pleasant, well-nourished, in no distress , normal affect ENT: no lesions, no post nasal drip Neck: No JVD, no TMG, no carotid bruits Lungs: No use of accessory muscles, no dullness to percussion, distant breath sounds, poor airflow Cardiovascular: RRR, heart sounds normal, no murmurs or gallops, no peripheral edema Abdomen: soft and non-tender, no HSM, high pitched rush sound c/w partial bowel obstruction Musculoskeletal: No deformities, no cyanosis or clubbing Neuro: alert, non-focal     Impression & Recommendations:  Problem # 1:  COPD (ICD-496) Assessment Unchanged  Stable COPD  but endstage plan cont with hospice no change in inhaled meds   Medications Added to Medication List This Visit: 1)  Norvasc 5 Mg Tabs (Amlodipine besylate) .... Once  daily  Complete Medication List: 1)  Brovana 15 Mcg/68ml Nebu (Arformoterol tartrate) .... One in neb two times a day 2)  Nexium 40 Mg Cpdr (Esomeprazole magnesium) .... One by mouth once a day 3)  Nitro-dur 0.4 Mg/hr Pt24 (Nitroglycerin) .... One on skin once a day 4)  Metoprolol Tartrate 25 Mg Tabs (Metoprolol tartrate) .... Two times a day 5)  Imdur 60 Mg Tb24 (Isosorbide mononitrate) .... 1/2 by mouth once daily 6)  Paxil 10 Mg Tabs (Paroxetine hcl) .... One by mouth once daily 7)  Spiriva Handihaler 18 Mcg Caps (Tiotropium bromide monohydrate) .... One capsule in handihaler once daily 8)  Diovan 160 Mg Tabs (Valsartan) .... Take 1 tablet by mouth once a day 9)  Proair Hfa 108 (90 Base) Mcg/act Aers (Albuterol sulfate) .Marland Kitchen.. 1-2 puffs every 4-6 hours as needed 10)  Nitrostat 0.4 Mg Subl (Nitroglycerin) .... One sl as needed 11)  Polyethylene Glycol 3350 Oral Powd (Polyethylene glycol 3350) .... Once daily 12)  Furosemide 40 Mg Tabs (  Furosemide) .Marland Kitchen.. 1 by mouth daily 13)  Oxygen  .... 2.5l continuous 14)  Tylenol 325 Mg Tabs (Acetaminophen) .... As needed 15)  Delsym 30 Mg/66ml Lqcr (Dextromethorphan polistirex) .... Every 12 hours as needed as directed 16)  Alprazolam 1 Mg Tabs (Alprazolam) .... Three times a day as needed 17)  Lovastatin 40 Mg Tabs (Lovastatin) .... Once daily 18)  Norvasc 5 Mg Tabs (Amlodipine besylate) .... Once daily  Other Orders: Est. Patient Level III ([address])  Patient Instructions: 1)  No change in medications  2)  Return two months

## 2010-07-25 NOTE — Miscellaneous (Signed)
Summary: Order/[place]  Order/[place]   Imported By: Lester Garvin 01/31/2010 08:42:47  _____________________________________________________________________  External Attachment:    Type:   Image     Comment:   External Document

## 2010-07-25 NOTE — Miscellaneous (Signed)
Summary: Plan of Treatment/Hospice @ [facility] of Treatment/Hospice @ [place]   Imported By: Sherian Rein 08/09/2009 09:22:21  _____________________________________________________________________  External Attachment:    Type:   Image     Comment:   External Document

## 2010-07-25 NOTE — Miscellaneous (Signed)
Summary: Order/[place]  Order/   Imported By: Lester [place] 08/01/2009 10:16:11  _____________________________________________________________________  External Attachment:    Type:   Image     Comment:   External Document

## 2010-07-25 NOTE — Progress Notes (Signed)
Summary: needs refill by end of today  Phone Note Call from Patient Call back at [phone]   Caller: [name] from [facility] Call For: wright Reason for Call: Refill Medication Summary of Call: she needs a refill filled by the end of today. [facility] [place] [facility] her for xanax. Initial call taken by: Valinda Hoar,  November 30, 2009 4:33 PM  Follow-up for Phone Call        Only one fax request received in Triage thus far. Only 2 MD's in office this PM. Will check to see if [facility] is available for same. Zackery Barefoot CMA  November 30, 2009 4:36 PM  ***Allergies (verified):  1)  ! Cipro  [facility] agreed to approve same, pending signature. Zackery Barefoot CMA  November 30, 2009 4:50 PM   rx signed and faxed to pharmacy.  Aundra Millet Reynolds LPN  November 30, 2009 5:25 PM     Prescriptions: ALPRAZOLAM 1 MG TABS (ALPRAZOLAM) three times a day as needed  #90 x 3   Entered by:   Arman Filter LPN   Authorized by:   Barbaraann Share MD   Signed by:   Arman Filter LPN on 45/40/9811   Method used:   [facility] ...       [facility]  [address] [id number]       [address]       [place], [place]  [address]       Ph: [phone] or [phone]       Fax: [phone]   RxID:   [id number]

## 2010-07-25 NOTE — Miscellaneous (Signed)
Summary: Plan/[place]  Plan/[place]   Imported By: Lester [place] 06/10/2010 07:34:58  _____________________________________________________________________  External Attachment:    Type:   Image     Comment:   External Document

## 2010-07-25 NOTE — Assessment & Plan Note (Signed)
Summary: Pulmonary OV   Primary Provider/Referring Provider:  Cassell Clement  CC:  6 wk follow up.  Pt states breathing seems worse since last OV -- having increasse SOB more often.  States yesterday was an "exceptionally bad day" and states she almost passed out.  Cough - prod at times with clear mucus.  Requesting flu vac. 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  The patient has chronic abdominal distention from partial small bowel dysfunction, and obstruction.  Pt now in Hospice   Nov 06, 2009 2:40 PM Two month f/u   Pt notes she is sleeping all the time and turned up oxygen to 3L The pt uses the walker,  if standing will lose balance and fall to side.  Hospice remains helpful. Pt denies any significant sore throat, nasal congestion or excess secretions, fever, chills, sweats, unintended weight loss, pleurtic or exertional chest pain, orthopnea PND, or leg swelling Pt denies any increase in rescue therapy over baseline, denies waking up needing it or having any early am or nocturnal exacerbations of coughing/wheezing/or dyspnea.   January 08, 2010 2:06 PM f/u copd.  Pt notes in throat will gurgle.  Then also noting more memory loss.  Forgetting where she is putting items that are important.  No change in weakness.  The pt did note some tightness in the chest. No change in vision.  Notes some edema in the feet.  Feet continue to swell.  Dyspnea sl worse to same. Notes some mucus, this will occasionally come up on its own.  February 05, 2010 4:38 PM The pt is here for copd f/u.   The pt received at the last ov a pred taper which  helped    Now there is  occ cough at night and is productive,  still gray color.   The spiriva makes pt cough.  The pt is still in the hospice program. March 19, 2010 2:71M Spell of near syncope,  notes more coughing.   Now having  more chest pain.  Heart beating fast and fell last week. Notes more edema in LE.  Still in hospice and requalified.    Preventive Screening-Counseling & Management  Alcohol-Tobacco     Smoking Status: quit > 6 months     Year Started: 1944     Year Quit: 1973     Pack years: 31  Current Medications (verified): 1)  Brovana 15 Mcg/69ml  Nebu (Arformoterol Tartrate) .... One in [place] Two Times A Day 2)  Nexium 40 Mg  Cpdr (Esomeprazole Magnesium) .... One By Mouth Once A Day 3)  Nitro-Dur 0.4 Mg/hr  Pt24 (Nitroglycerin) .... One On Skin Once A Day 4)  Metoprolol Tartrate 25 Mg  Tabs (Metoprolol Tartrate) .... Two Times A Day 5)  Imdur 60 Mg  Tb24 (Isosorbide Mononitrate) .... 1/2 By Mouth Once Daily 6)  Paxil 10 Mg  Tabs (Paroxetine Hcl) .... One By Mouth Once Daily 7)  Spiriva Handihaler 18 Mcg  Caps (Tiotropium Bromide Monohydrate) .... One Capsule in Handihaler Once Daily 8)  Diovan 160 Mg Tabs (Valsartan) .... Take 1 Tablet By Mouth Once A Day 9)  Proair Hfa 108 (90 Base) Mcg/act  Aers (Albuterol Sulfate) .Marland Kitchen.. 1-2 Puffs Every 4-6 Hours As Needed 10)  Nitrostat 0.4 Mg  Subl (Nitroglycerin) .... One Sl As Needed 11)  Polyethylene Glycol 3350  Oral Powd (Polyethylene Glycol 3350) .Marland KitchenMarland KitchenMarland Kitchen  Once Daily 12)  Furosemide 40 Mg Tabs (Furosemide) .Marland Kitchen.. 1 By Mouth Daily 13)  Oxygen .Marland Kitchen.. 3l Rest 5 L Exertion 14)  Tylenol 325 Mg Tabs (Acetaminophen) .... As Needed 15)  Delsym 30 Mg/73ml Lqcr (Dextromethorphan Polistirex) .... Every 12 Hours As Needed As Directed 16)  Alprazolam 1 Mg Tabs (Alprazolam) .... Three Times A Day 17)  Lovastatin 40 Mg Tabs (Lovastatin) .... Once Daily 18)  Norvasc 5 Mg Tabs (Amlodipine Besylate) .... Once Daily 19)  Albuterol Sulfate (2.5 Mg/77ml) 0.083% Nebu (Albuterol Sulfate) .... Four Times A Day As Needed 20)  Flonase 50 Mcg/act Susp (Fluticasone Propionate) .... 2 Sprays Each Nostril At Bedtime 21)  Calcium .... Once Daily 22)  Multivitamins  Tabs (Multiple Vitamin) ....  Once Daily  Allergies (verified): 1)  ! Cipro  Past History:  Past medical, surgical, family and social histories (including risk factors) reviewed, and no changes noted (except as noted below).  Past Medical History: Reviewed history from 06/11/2007 and no changes required. 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 non contrib  Social History: Reviewed history from 06/11/2007 and no changes required. Patient states former smoker.  Quit in 1990's.  1 1/2 ppd x 40 yrs.  Review of Systems       The patient complains of shortness of breath with activity, productive cough, non-productive cough, and abdominal pain.  The patient denies shortness of breath at rest, coughing up blood, chest pain, irregular heartbeats, acid heartburn, indigestion, loss of appetite, weight change, difficulty swallowing, sore throat, tooth/dental problems, headaches, nasal congestion/difficulty breathing through nose, sneezing, itching, ear ache, anxiety, depression, hand/feet swelling, joint stiffness or pain, rash, change in color of mucus, and fever.    Vital Signs:  Patient profile:   77 year old female Height:      59 inches Weight:      152 pounds BMI:     30.81 O2 Sat:      89 % on 4 L/mincont Temp:     98.9 degrees F oral Pulse  rate:   65 / minute BP sitting:   102 / 68  (left arm) Cuff size:   regular  Vitals Entered By: Gweneth Dimitri RN (March 19, 2010 2:04 PM)  Nutrition Counseling: Patient's BMI is greater than 25 and therefore counseled on weight management options.  O2 Flow:  4 L/mincont  O2 Sat Comments Pt arrived to exam room with o2 sat 89% on 4L cont.  After resting, o2 sat increased to 92% 4L cont with pulse of 64.  Gweneth Dimitri RN  March 19, 2010 2:15 PM  CC: 6 wk follow up.  Pt states breathing seems worse since last OV -- having increasse SOB more often.  States yesterday was an "exceptionally bad day" and states she almost passed out.  Cough - prod at times with clear mucus.  Requesting flu vac.  Comments Medications reviewed with patient Daytime contact number verified with patient. Gweneth Dimitri RN  March 19, 2010 2:07 PM    Physical Exam  Additional Exam:  Gen Pt  in no distress , normal affect ENT: no lesions, no post nasal drip Neck: No JVD, no TMG, no carotid bruits Lungs: No use of accessory muscles, no dullness to percussion, distant breath sounds, poor airflow Cardiovascular: RRR, heart sounds normal, no murmurs or gallops, no peripheral edema Abdomen: soft and non-tender, no HSM, high pitched rush sound c/w partial bowel obstruction Musculoskeletal: No deformities, no cyanosis or clubbing Neuro: alert, non-focal     Impression & Recommendations:  Problem # 1:  COPD (ICD-496) Assessment Unchanged  COPD Golds Stage IV  plan cont with hospice no change in inhaled meds   Problem # 2:  CORONARY ARTERY DISEASE (ICD-414.00) Assessment: Deteriorated ongoing ischemia and near syncope  with ischemic CM suspect this will likely result in poor outcome pt agrees to NCB and comfort care only now  Her updated medication list for this problem includes:    Nitro-dur 0.4 Mg/hr Pt24 (Nitroglycerin) ..... One on skin once a day    Metoprolol Tartrate 25 Mg Tabs  (Metoprolol tartrate) .Marland Kitchen..Marland Kitchen Two times a day    Imdur 60 Mg Tb24 (Isosorbide mononitrate) .Marland Kitchen... 1/2 by mouth once daily    Diovan 160 Mg Tabs (Valsartan) .Marland Kitchen... Take 1 tablet by mouth once a day    Furosemide 40 Mg Tabs (Furosemide) .Marland Kitchen... 1 by mouth daily    Norvasc 5 Mg Tabs (Amlodipine besylate) ..... Once daily    Nitrostat 0.4 Mg Subl (Nitroglycerin) ..... One sl as needed  Orders: Est. Patient Level III (16109)  Medications Added to Medication List This Visit: 1)  Alprazolam 1 Mg Tabs (Alprazolam) .... Three times a day and one every 4 hours as needed  Complete Medication List: 1)  Flonase 50 Mcg/act Susp (Fluticasone propionate) .... 2 sprays each nostril at bedtime 2)  Brovana 15 Mcg/53ml Nebu (Arformoterol tartrate) .... One in neb two times a day 3)  Nexium 40 Mg Cpdr (Esomeprazole magnesium) .... One by mouth once a day 4)  Nitro-dur 0.4 Mg/hr Pt24 (Nitroglycerin) .... One on skin once a day 5)  Metoprolol Tartrate 25 Mg Tabs (Metoprolol tartrate) .... Two times a day 6)  Imdur 60 Mg Tb24 (Isosorbide mononitrate) .... 1/2 by mouth once daily 7)  Paxil 10 Mg Tabs (Paroxetine hcl) .... One by mouth once daily 8)  Spiriva Handihaler 18 Mcg Caps (Tiotropium bromide monohydrate) .... One capsule in handihaler once daily 9)  Diovan 160 Mg Tabs (Valsartan) .... Take 1 tablet by mouth once a day 10)  Polyethylene Glycol 3350 Oral Powd (Polyethylene glycol 3350) .... Once daily 11)  Furosemide 40 Mg Tabs (Furosemide) .Marland Kitchen.. 1 by mouth daily 12)  Oxygen  .Marland Kitchen.. 3l rest 5 l exertion 13)  Alprazolam 1 Mg Tabs (Alprazolam) .... Three times a day and one every 4 hours as needed 14)  Lovastatin 40 Mg Tabs (Lovastatin) .... Once daily 15)  Norvasc 5 Mg Tabs (Amlodipine besylate) .... Once daily 16)  Calcium  .... Once daily 17)  Multivitamins Tabs (Multiple vitamin) .... Once daily 18)  Proair Hfa 108 (90 Base) Mcg/act Aers (Albuterol sulfate) .Marland Kitchen.. 1-2 puffs every 4-6 hours as needed 19)   Albuterol Sulfate (2.5 Mg/89ml) 0.083% Nebu (Albuterol sulfate) .... Four times a day as needed 20)  Delsym 30 Mg/40ml Lqcr (Dextromethorphan polistirex) .... Every 12 hours as needed as directed 21)  Tylenol 325 Mg Tabs (Acetaminophen) .... As needed 22)  Nitrostat 0.4 Mg  Subl (Nitroglycerin) .... One sl as needed  Other Orders: Pneumococcal Vaccine (44034) Admin 1st Vaccine (74259) Flu Vaccine 65yrs + MEDICARE PATIENTS (D6387) Administration Flu vaccine - MCR (F6433)  Patient Instructions: 1)  No change in medications 2)  Return in   1       month 3)  Pneumovax and flu vaccine today   Immunizations Administered:  Pneumonia Vaccine:    Vaccine Type: Pneumovax    Site: left deltoid    Mfr: Merck    Dose: 0.5 ml    Route: IM    Given by: Carver Fila    Exp. Date: 09/05/2011    Lot #: [id number]          Flu Vaccine Consent Questions     Do you have a history of severe allergic reactions to this vaccine? no    Any prior history of allergic reactions to egg and/or gelatin? no    Do you have a sensitivity to the preservative Thimersol? no    Do you have a past history of Guillan-Barre Syndrome? no    Do you currently have an acute febrile illness? no    Have you ever had a severe reaction to latex? no    Vaccine information given and explained to patient? yes    Are you currently pregnant? no    Lot Number:[id number]   Exp Date:12/21/2010   Site Given  Right Deltoid IMlu   Carver Fila  March 19, 2010 2:43 PM   Prevention & Chronic Care Immunizations   Influenza vaccine: Fluvax 3+  (03/19/2010)    Tetanus booster: Not documented    Pneumococcal vaccine: Pneumovax  (03/19/2010)    H. zoster vaccine: Not documented  Colorectal Screening   Hemoccult: Not documented    Colonoscopy: Not documented  Other Screening   Pap smear: Not documented    Mammogram: Not documented    DXA bone density scan: Not documented   Smoking status: quit > 6 months   (03/19/2010)  Lipids   Total Cholesterol: Not documented   LDL: Not documented   LDL Direct: Not documented   HDL: Not documented   Triglycerides: Not documented  Hypertension   Last Blood Pressure: 102 / 68  (03/19/2010)   Serum creatinine: 0.9  (04/23/2009)   Serum potassium 3.7  (04/23/2009)  Self-Management Support :    Hypertension self-management support: Not documented   Nursing Instructions: Give Pneumovax today Give Flu vaccine today    Appended Document: Pulmonary OV fax tom brackbill

## 2010-07-25 NOTE — Miscellaneous (Signed)
Summary: Medication orders/Hospice @ [facility]  Medication orders/Hospice @    Imported By: Sherian Rein 10/10/2009 08:49:03  _____________________________________________________________________  External Attachment:    Type:   Image     Comment:   External Document

## 2010-07-25 NOTE — Miscellaneous (Signed)
Summary: Treatment Plan/Hospice @ [facility]  Treatment Plan/Hospice @ [place]   Imported By: Sherian Rein 04/08/2010 14:06:20  _____________________________________________________________________  External Attachment:    Type:   Image     Comment:   External Document

## 2010-07-25 NOTE — Progress Notes (Signed)
Summary: talk to nurse about oxygen  Phone Note From Other Clinic Call back at [phone]   Caller: Patient Caller: hospice nurse Rosey Bath Call For: wright Summary of Call: want to know if pt can use eclipse concentrator when she go out of town. pls call before 10:30 or after 12:30 today Initial call taken by: Rickard Patience,  November 29, 2009 10:10 AM  Follow-up for Phone Call        hospice is currently discussing this with the hospice md if they need [place] they will call us back Follow-up by: Philipp Deputy CMA,  November 29, 2009 10:37 AM

## 2010-07-25 NOTE — Miscellaneous (Signed)
Summary: Certification & Plan of Care /  Hospice at [facility]  Certification & Plan of Care /  Hospice at [facility]   Imported By: [facility] 02/05/2010 12:00:06  _____________________________________________________________________  External Attachment:    Type:   Image     Comment:   External Document

## 2010-07-25 NOTE — Assessment & Plan Note (Signed)
Summary: Pulmonary OV   Primary Provider/Referring Provider:  Cassell Clement  CC:  4 week follow up, pt states breathing is about the same depending on the acivity that is done, she gets SOB, pt states the spiriva helps her out, occasional productive cough with greyish phlem, chest tightness, some chest pain yesterday, and .  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  The patient has chronic abdominal distention from partial small bowel dysfunction, and obstruction.  Pt now in Hospice   Nov 06, 2009 2:40 PM Two month f/u   Pt notes she is sleeping all the time and turned up oxygen to 3L The pt uses the walker,  if standing will lose balance and fall to side.  Hospice remains helpful. Pt denies any significant sore throat, nasal congestion or excess secretions, fever, chills, sweats, unintended weight loss, pleurtic or exertional chest pain, orthopnea PND, or leg swelling Pt denies any increase in rescue therapy over baseline, denies waking up needing it or having any early am or nocturnal exacerbations of coughing/wheezing/or dyspnea.   January 08, 2010 2:06 PM f/u copd.  Pt notes in throat will gurgle.  Then also noting more memory loss.  Forgetting where she is putting items that are important.  No change in weakness.  The pt did note some tightness in the chest. No change in vision.  Notes some edema in the feet.  Feet continue to swell.  Dyspnea sl worse to same. Notes some mucus, this will occasionally come up on its own.  February 05, 2010 4:38 PM The pt is here for copd f/u.   The pt received at the last ov a pred taper which  helped    Now there is  occ cough at night and is productive,  still gray color.   The spiriva makes pt cough.  The pt is still in the hospice program.   Preventive Screening-Counseling & Management  Alcohol-Tobacco     Smoking Status:  quit > 6 months     Year Started: 1942     Year Quit: 1973     Pack years: 25  Current Medications (verified): 1)  Brovana 15 Mcg/48ml  Nebu (Arformoterol Tartrate) .... One in [place] Two Times A Day 2)  Nexium 40 Mg  Cpdr (Esomeprazole Magnesium) .... One By Mouth Once A Day 3)  Nitro-Dur 0.4 Mg/hr  Pt24 (Nitroglycerin) .... One On Skin Once A Day 4)  Metoprolol Tartrate 25 Mg  Tabs (Metoprolol Tartrate) .... Two Times A Day 5)  Imdur 60 Mg  Tb24 (Isosorbide Mononitrate) .... 1/2 By Mouth Once Daily 6)  Paxil 10 Mg  Tabs (Paroxetine Hcl) .... One By Mouth Once Daily 7)  Spiriva Handihaler 18 Mcg  Caps (Tiotropium Bromide Monohydrate) .... One Capsule in Handihaler Once Daily 8)  Diovan 160 Mg Tabs (Valsartan) .... Take 1 Tablet By Mouth Once A Day 9)  Proair Hfa 108 (90 Base) Mcg/act  Aers (Albuterol Sulfate) .Marland Kitchen.. 1-2 Puffs Every 4-6 Hours As Needed 10)  Nitrostat 0.4 Mg  Subl (Nitroglycerin) .... One Sl As Needed 11)  Polyethylene Glycol 3350  Oral Powd (Polyethylene Glycol 3350) .... Once Daily 12)  Furosemide 40 Mg Tabs (Furosemide) .Marland Kitchen.. 1 By Mouth Daily 13)  Oxygen .Marland Kitchen.. 3l Rest 5 L Exertion 14)  Tylenol 325 Mg Tabs (Acetaminophen) .... As Needed 15)  Delsym 30 Mg/22ml Lqcr (Dextromethorphan Polistirex) .... Every 12  Hours As Needed As Directed 16)  Alprazolam 1 Mg Tabs (Alprazolam) .... Three Times A Day 17)  Lovastatin 40 Mg Tabs (Lovastatin) .... Once Daily 18)  Norvasc 5 Mg Tabs (Amlodipine Besylate) .... Once Daily 19)  Albuterol Sulfate (2.5 Mg/87ml) 0.083% Nebu (Albuterol Sulfate) .... Four Times A Day As Needed 20)  Flonase 50 Mcg/act Susp (Fluticasone Propionate) .... 2 Sprays Each Nostril At Bedtime 21)  Calcium .... Once Daily 22)  Multivitamins  Tabs (Multiple Vitamin) .... Once Daily  Allergies (verified): 1)  ! Cipro  Past History:  Past medical, surgical, family and social histories (including risk factors) reviewed, and no changes noted (except as noted below).  Past  Medical History: Reviewed history from 06/11/2007 and no changes required. 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 non contrib  Social History: Reviewed history from 06/11/2007 and no changes required. Patient states former smoker.   Review of Systems       The patient complains of shortness of breath with activity, shortness of breath at rest, productive cough, and non-productive cough.  The patient denies coughing up blood, chest pain, irregular heartbeats, acid heartburn, indigestion, loss of appetite, weight change, abdominal pain, difficulty swallowing, sore throat, tooth/dental problems, headaches, nasal congestion/difficulty breathing through nose, sneezing, itching, ear ache, anxiety, depression, hand/feet swelling, joint stiffness or pain, rash, change in color of mucus, and fever.    Vital Signs:  Patient profile:   77 year old female Height:      59 inches Weight:      150.4 pounds O2 Sat:      94 % on 3 L/min Temp:     98.0 degrees F oral Pulse rate:   69 / minute BP sitting:   112 / 64  (right arm) Cuff size:   regular  Vitals Entered By: Carver Fila (February 05, 2010 4:03 PM)  O2 Flow:  3 L/min CC: 4 week follow up, pt states breathing is about the same depending on the acivity that is done, she  gets SOB, pt states the spiriva helps her out,  occasional productive cough with greyish phlem, chest tightness, some chest pain yesterday,  Comments meds and allergies udpaetd Phone number updated Carver Fila  February 05, 2010 4:21 PM    Physical Exam  Additional Exam:  Gen Pt  in no distress , normal affect ENT: no lesions, no post nasal drip Neck: No JVD, no TMG, no carotid bruits Lungs: No use of accessory muscles, no dullness to percussion, distant breath sounds, poor airflow Cardiovascular: RRR, heart sounds normal, no murmurs or gallops, no peripheral edema Abdomen: soft and non-tender, no HSM, high pitched rush sound c/w partial bowel obstruction Musculoskeletal: No deformities, no cyanosis or clubbing Neuro: alert, non-focal     Impression & Recommendations:  Problem # 1:  COPD (ICD-496) Assessment Unchanged  COPD Golds Stage IV  plan cont with hospice no change in inhaled meds   Complete Medication List: 1)  Brovana 15 Mcg/77ml Nebu (Arformoterol tartrate) .... One in neb two times a day 2)  Nexium 40 Mg Cpdr (Esomeprazole magnesium) .... One by mouth once a day 3)  Nitro-dur 0.4 Mg/hr Pt24 (Nitroglycerin) .... One on skin once a day 4)  Metoprolol Tartrate 25 Mg Tabs (Metoprolol tartrate) .... Two times a day 5)  Imdur 60 Mg Tb24 (Isosorbide mononitrate) .... 1/2 by mouth once daily 6)  Paxil 10 Mg Tabs (Paroxetine hcl) .... One by mouth once daily 7)  Spiriva Handihaler 18 Mcg Caps (Tiotropium bromide monohydrate) .... One capsule in handihaler once daily 8)  Diovan 160 Mg Tabs (Valsartan) .... Take 1 tablet by mouth once a day 9)  Proair Hfa 108 (90 Base) Mcg/act Aers (Albuterol sulfate) .Marland Kitchen.. 1-2 puffs every 4-6 hours as needed 10)  Nitrostat 0.4 Mg Subl (Nitroglycerin) .... One sl as needed 11)  Polyethylene Glycol 3350 Oral Powd (Polyethylene glycol 3350) .... Once daily 12)  Furosemide 40 Mg Tabs (Furosemide) .Marland Kitchen.. 1 by mouth daily 13)  Oxygen  .Marland Kitchen.. 3l rest 5 l  exertion 14)  Tylenol 325 Mg Tabs (Acetaminophen) .... As needed 15)  Delsym 30 Mg/24ml Lqcr (Dextromethorphan polistirex) .... Every 12 hours as needed as directed 16)  Alprazolam 1 Mg Tabs (Alprazolam) .... Three times a day 17)  Lovastatin 40 Mg Tabs (Lovastatin) .... Once daily 18)  Norvasc 5 Mg Tabs (Amlodipine besylate) .... Once daily 19)  Albuterol Sulfate (2.5 Mg/5ml) 0.083% Nebu (Albuterol sulfate) .... Four times a day as needed 20)  Flonase 50 Mcg/act Susp (Fluticasone propionate) .... 2 sprays each nostril at bedtime 21)  Calcium  .... Once daily 22)  Multivitamins Tabs (Multiple vitamin) .... Once daily  Other Orders: Est. Patient Level III (60454)  Patient Instructions: 1)  No change in medications 2)  Return in       6 weeks

## 2010-07-25 NOTE — Progress Notes (Signed)
Summary: flonas rx called in  Phone Note Call from Patient   Caller: hospice nurse jennifer Call For: wright Summary of Call: per jennifer- hospice nurse- pt is telling her that she did not pick up a rx for FLUTICASONE on 8/26. needs this as she has been out for some time. requests [facility] call [facility] on [address] asap to straighten this out. jennifer (nurse) # [phone] Initial call taken by: Tivis Ringer, CNA,  March 01, 2010 9:22 AM  Follow-up for Phone Call        [facility] is stating they never recevied refill for fluticasone nasal spray eventhough it was sent on 02-15-10. I spoke to pharmacists and gave verbal for refill. I called and advised Victorino Dike, hospice nurse that rx was called in. Carron Curie CMA  March 01, 2010 9:47 AM

## 2010-07-25 NOTE — Miscellaneous (Signed)
Summary: Orders Update  Clinical Lists Changes  Orders: Added new Service order of Est. Patient Level III (99213) - Signed 

## 2010-07-25 NOTE — Assessment & Plan Note (Signed)
Summary: Pulmonary OV   Primary Provider/Referring Provider:  Cassell Clement  CC:  6 wk follow up.  SOB with exertion - unchanged.  Cough with light beige/white mucus and wheezing at times.  C/o aching pain in middle right back - worse at times with inhalations..  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  The patient has chronic abdominal distention from partial small bowel dysfunction, and obstruction.  Pt now in Hospice   Nov 06, 2009 2:40 PM Two month f/u   Pt notes she is sleeping all the time and turned up oxygen to 3L The pt uses the walker,  if standing will lose balance and fall to side.  Hospice remains helpful. Pt denies any significant sore throat, nasal congestion or excess secretions, fever, chills, sweats, unintended weight loss, pleurtic or exertional chest pain, orthopnea PND, or leg swelling Pt denies any increase in rescue therapy over baseline, denies waking up needing it or having any early am or nocturnal exacerbations of coughing/wheezing/or dyspnea.   January 08, 2010 2:06 PM f/u copd.  Pt notes in throat will gurgle.  Then also noting more memory loss.  Forgetting where she is putting items that are important.  No change in weakness.  The pt did note some tightness in the chest. No change in vision.  Notes some edema in the feet.  Feet continue to swell.  Dyspnea sl worse to same. Notes some mucus, this will occasionally come up on its own.  February 05, 2010 4:38 PM The pt is here for copd f/u.   The pt received at the last ov a pred taper which  helped    Now there is  occ cough at night and is productive,  still gray color.   The spiriva makes pt cough.  The pt is still in the hospice program. March 19, 2010 2:75M Spell of near syncope,  notes more coughing.   Now having more chest pain.  Heart beating fast and fell last week. Notes more  edema in LE.  Still in hospice and requalified.    April 19, 2010 12:18 PM Notes more obstruction in abd and will ease off.  Bowels move.  No emesis.  Feels full. Feels tired all the time.  Not much mucus.  Mucus worse after taking rx. Rx albuterol and will expectorate.. went [place] the beach  May 28, 2010 12:03 PM f/u one month.  since last ov had a deep cough and hurt and ached in chest and pain bilateral.  Pain worse on R side.  No real fever.  Notes no edema in ankles.  Abd pain is the same to worse.  Can feel very slow for BMs to move.   July 10, 2010 4:00 PM No real changes from last ov. Pt continues to do poorly and remains in hospice.  There is no excess mucus. THere is continued abdominal pain noted.   The pt does feel tired all of the time.  Pt remains on BDs      Current Medications (verified): 1)  Flonase 50 Mcg/act Susp (Fluticasone Propionate) .... 2 Sprays Each Nostril At Bedtime As Needed 2)  Brovana 15 Mcg/75ml  Nebu (Arformoterol Tartrate) .... One in [place] Two Times A Day 3)  Nexium 40 Mg  Cpdr (Esomeprazole Magnesium) .... One By Mouth Once A Day 4)  Nitro-Dur 0.4 Mg/hr  Pt24 (Nitroglycerin) .... One On  Skin Once A Day 5)  Metoprolol Tartrate 25 Mg  Tabs (Metoprolol Tartrate) .... Two Times A Day 6)  Imdur 60 Mg  Tb24 (Isosorbide Mononitrate) .... 1/2 By Mouth Once Daily 7)  Paxil 10 Mg  Tabs (Paroxetine Hcl) .... One By Mouth Once Daily 8)  Spiriva Handihaler 18 Mcg  Caps (Tiotropium Bromide Monohydrate) .... One Capsule in Handihaler Once Daily 9)  Diovan 160 Mg Tabs (Valsartan) .... Take 1/2  Tablet By Mouth Once A Day 10)  Polyethylene Glycol 3350  Oral Powd (Polyethylene Glycol 3350) .... Once Daily 11)  Furosemide 40 Mg Tabs (Furosemide) .Marland Kitchen.. 1 By Mouth Daily 12)  Oxygen .... 3.5l Rest 4-5 L Exertion 13)  Alprazolam 1 Mg Tabs (Alprazolam) .... Three Times A Day and One Every 4 Hours As Needed 14)  Lovastatin 40 Mg Tabs (Lovastatin) .... Once Daily 15)   Norvasc 5 Mg Tabs (Amlodipine Besylate) .... Once Daily 16)  Proair Hfa 108 (90 Base) Mcg/act  Aers (Albuterol Sulfate) .Marland Kitchen.. 1-2 Puffs Every 4-6 Hours As Needed 17)  Albuterol Sulfate (2.5 Mg/22ml) 0.083% Nebu (Albuterol Sulfate) .... Four Times A Day As Needed 18)  Delsym 30 Mg/25ml Lqcr (Dextromethorphan Polistirex) .... Every 12 Hours As Needed As Directed 19)  Tylenol 325 Mg Tabs (Acetaminophen) .... As Needed 20)  Nitrostat 0.4 Mg  Subl (Nitroglycerin) .... One Sl As Needed 21)  Saline Nasal Spray 0.65 % Soln (Saline) .... As Needed 22)  Dry Eyes  Oint (Artificial Tear Ointment) .... As Needed 23)  Tussin 100 Mg/83ml Syrp (Guaifenesin) .Marland Kitchen.. 1-2 Tsp Every 4 Hours As Needed  Allergies (verified): 1)  ! Cipro  Past History:  Past medical, surgical, family and social histories (including risk factors) reviewed, and no changes noted (except as noted below).  Past Medical History: Reviewed history from 06/11/2007 and no changes required. 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 non contrib  Social History: Reviewed history from 03/19/2010 and no changes required. Patient states former smoker.  Quit in 1990's.  1 1/2 ppd x 40 yrs.  Review of Systems       The patient complains of shortness of breath with activity,  shortness of breath at rest, productive cough, and non-productive cough.  The patient denies coughing up blood, chest pain, irregular heartbeats, acid heartburn, indigestion, loss of appetite, weight change, abdominal pain, difficulty swallowing, sore throat, tooth/dental problems, headaches, nasal congestion/difficulty breathing through nose, sneezing, itching, ear ache, anxiety, depression, hand/feet swelling, joint stiffness or pain, rash, change in color of mucus, and fever.    Vital Signs:  Patient profile:   77 year old female Height:      59 inches Weight:      148.25 pounds BMI:     30.05 O2 Sat:      93 % on 4 L/mincont Temp:     98.1 degrees F oral Pulse rate:   68 / minute BP sitting:   94 / 56  (left  arm) Cuff size:   regular  Vitals Entered By: Gweneth Dimitri RN (July 10, 2010 3:51 PM)  O2 Flow:  4 L/mincont CC: 6 wk follow up.  SOB with exertion - unchanged.  Cough with light beige/white mucus, wheezing at times.  C/o aching pain in middle right back - worse at times with inhalations. Comments Medications reviewed with patient Daytime contact number verified with patient. Gweneth Dimitri RN  July 10, 2010 3:51 PM    Physical Exam  Additional Exam:  Gen Pt  in no distress , normal affect ENT: no lesions, no post nasal drip Neck: No JVD, no TMG, no carotid bruits Lungs: No use of accessory muscles, no dullness to percussion, distant breath sounds, poor airflow Cardiovascular: RRR, heart sounds normal, no murmurs or gallops, no peripheral edema Abdomen: distended, no HSM, high pitched rush sound c/w partial bowel obstruction Musculoskeletal: No deformities, no cyanosis or clubbing Neuro: alert, non-focal     Impression & Recommendations:  Problem # 1:  COPD (ICD-496) Assessment Unchanged  COPD Golds Stage IV  plan cont with hospice no change in inhaled meds   Complete Medication List: 1)  Flonase 50 Mcg/act Susp (Fluticasone propionate) .... 2 sprays  each nostril at bedtime as needed 2)  Brovana 15 Mcg/73ml Nebu (Arformoterol tartrate) .... One in neb two times a day 3)  Nexium 40 Mg Cpdr (Esomeprazole magnesium) .... One by mouth once a day 4)  Nitro-dur 0.4 Mg/hr Pt24 (Nitroglycerin) .... One on skin once a day 5)  Metoprolol Tartrate 25 Mg Tabs (Metoprolol tartrate) .... Two times a day 6)  Imdur 60 Mg Tb24 (Isosorbide mononitrate) .... 1/2 by mouth once daily 7)  Paxil 10 Mg Tabs (Paroxetine hcl) .... One by mouth once daily 8)  Spiriva Handihaler 18 Mcg Caps (Tiotropium bromide monohydrate) .... One capsule in handihaler once daily 9)  Diovan 160 Mg Tabs (Valsartan) .... Take 1/2  tablet by mouth once a day 10)  Polyethylene Glycol 3350 Oral Powd (Polyethylene glycol 3350) .... Once daily 11)  Furosemide 40 Mg Tabs (Furosemide) .Marland Kitchen.. 1 by mouth daily 12)  Oxygen  .... 3.5l rest 4-5 l exertion 13)  Alprazolam 1 Mg Tabs (Alprazolam) .... Three times a day and one every 4 hours as needed 14)  Lovastatin 40 Mg Tabs (Lovastatin) .... Once daily 15)  Norvasc 5 Mg Tabs (Amlodipine besylate) .... Once daily 16)  Albuterol Sulfate (2.5 Mg/24ml) 0.083% Nebu (Albuterol sulfate) .... Four times a day as needed 17)  Delsym 30 Mg/33ml Lqcr (Dextromethorphan polistirex) .... Every 12 hours as needed as directed 18)  Tylenol 325 Mg Tabs (Acetaminophen) .... As needed 19)  Nitrostat 0.4 Mg Subl (Nitroglycerin) .... One sl as needed 20)  Saline Nasal Spray 0.65 % Soln (Saline) .... As needed 21)  Dry Eyes Oint (Artificial tear ointment) .... As needed 22)  Tussin 100 Mg/3ml Syrp (Guaifenesin) .Marland Kitchen.. 1-2 tsp every 4 hours as needed  Other Orders: Est. Patient Level III (16[address]) Hospice Referral (Hospice)  Patient Instructions: 1)  No change in medications 2)  Return in     4-6     weeks Prescriptions: ALPRAZOLAM 1 MG TABS (ALPRAZOLAM) three times a day and one every 4 hours as needed  #90 x 6   Entered and Authorized by:   Storm Frisk MD    Signed by:   Storm Frisk MD on 07/10/2010   Method used:   Print then Give to Patient   RxID:   [id number]

## 2010-07-25 NOTE — Assessment & Plan Note (Signed)
Summary: Pulmonary OV   Primary Provider/Referring Provider:  Cassell Clement  CC:  2 month follow up.  Pt states breathing is a little worse with activity.  Pt states she is now on 3L all the time.  States she is also coughing up more white mucus after neb treatments.  .  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  The patient has chronic abdominal distention from partial small bowel dysfunction, and obstruction.  Pt now in Hospice   Nov 06, 2009 2:40 PM Two month f/u   Pt notes she is sleeping all the time and turned up oxygen to 3L The pt uses the walker,  if standing will lose balance and fall to side.  Hospice remains helpful. Pt denies any significant sore throat, nasal congestion or excess secretions, fever, chills, sweats, unintended weight loss, pleurtic or exertional chest pain, orthopnea PND, or leg swelling Pt denies any increase in rescue therapy over baseline, denies waking up needing it or having any early am or nocturnal exacerbations of coughing/wheezing/or dyspnea.     Preventive Screening-Counseling & Management  Alcohol-Tobacco     Smoking Status: quit > 6 months  Current Medications (verified): 1)  Brovana 15 Mcg/32ml  Nebu (Arformoterol Tartrate) .... One in [place] Two Times A Day 2)  Nexium 40 Mg  Cpdr (Esomeprazole Magnesium) .... One By Mouth Once A Day 3)  Nitro-Dur 0.4 Mg/hr  Pt24 (Nitroglycerin) .... One On Skin Once A Day 4)  Metoprolol Tartrate 25 Mg  Tabs (Metoprolol Tartrate) .... Two Times A Day 5)  Imdur 60 Mg  Tb24 (Isosorbide Mononitrate) .... 1/2 By Mouth Once Daily 6)  Paxil 10 Mg  Tabs (Paroxetine Hcl) .... One By Mouth Once Daily 7)  Spiriva Handihaler 18 Mcg  Caps (Tiotropium Bromide Monohydrate) .... One Capsule in Handihaler Once Daily 8)  Diovan 160 Mg Tabs (Valsartan) .... Take 1 Tablet By Mouth Once A Day 9)  Proair  Hfa 108 (90 Base) Mcg/act  Aers (Albuterol Sulfate) .Marland Kitchen.. 1-2 Puffs Every 4-6 Hours As Needed 10)  Nitrostat 0.4 Mg  Subl (Nitroglycerin) .... One Sl As Needed 11)  Polyethylene Glycol 3350  Oral Powd (Polyethylene Glycol 3350) .... Once Daily 12)  Furosemide 40 Mg Tabs (Furosemide) .Marland Kitchen.. 1 By Mouth Daily 13)  Oxygen .Marland Kitchen.. 3l Cont 14)  Tylenol 325 Mg Tabs (Acetaminophen) .... As Needed 15)  Delsym 30 Mg/36ml Lqcr (Dextromethorphan Polistirex) .... Every 12 Hours As Needed As Directed 16)  Alprazolam 1 Mg Tabs (Alprazolam) .... Three Times A Day As Needed 17)  Lovastatin 40 Mg Tabs (Lovastatin) .... Once Daily 18)  Norvasc 5 Mg Tabs (Amlodipine Besylate) .... Once Daily 19)  Albuterol Sulfate (2.5 Mg/35ml) 0.083% Nebu (Albuterol Sulfate) .... Four Times A Day As Needed 20)  Flonase 50 Mcg/act Susp (Fluticasone Propionate) .... 2 Sprays Each Nostril At Bedtime 21)  Calcium .... Once Daily 22)  Multivitamins  Tabs (Multiple Vitamin) .... Once Daily  Allergies (verified): 1)  ! Cipro  Past History:  Past medical, surgical, family and social histories (including risk factors) reviewed for relevance to current acute and chronic problems.  Past Medical History: Reviewed history from 06/11/2007 and no changes required. 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 non contrib  Social History: Reviewed history from 06/11/2007 and no changes required. Patient states former smoker.   Review of Systems       The patient complains of shortness of breath with activity, non-productive cough, and abdominal pain.  The patient denies shortness of breath at rest, productive cough, coughing up blood, chest pain, irregular heartbeats, acid heartburn, indigestion, loss of appetite, weight change, difficulty swallowing, sore throat, tooth/dental problems, headaches, nasal congestion/difficulty breathing through nose, sneezing, itching, ear ache, anxiety, depression, hand/feet swelling, joint stiffness or pain, rash, change in color of mucus, and fever.    Vital Signs:  Patient profile:   77 year old female Height:      59 inches Weight:      153 pounds BMI:     31.01 O2 Sat:      92 % on 3 L/mincont Temp:     98.1 degrees F oral Pulse rate:   71 / minute BP sitting:   122 / 64  (left arm) Cuff size:   regular  Vitals Entered By: Gweneth Dimitri RN (Nov 06, 2009 2:25 PM)  O2 Flow:  3 L/mincont CC: 2 month follow up.  Pt states breathing is a little worse with activity.  Pt states she is now on 3L all the time.  States she is also coughing up more white mucus after neb treatments.   Comments Medications reviewed with patient Daytime contact number verified with patient. Gweneth Dimitri RN  Nov 06, 2009 2:25 PM    Physical Exam  Additional Exam:  Gen Pt  in no distress , normal affect ENT: no lesions, no post nasal drip Neck: No JVD, no TMG, no carotid bruits Lungs: No use of accessory muscles, no dullness to percussion, distant breath sounds, poor airflow Cardiovascular: RRR, heart sounds normal, no murmurs or gallops, no peripheral edema Abdomen: soft and non-tender, no HSM, high pitched rush sound c/w partial bowel obstruction Musculoskeletal: No deformities, no cyanosis or clubbing Neuro: alert,  non-focal     Impression & Recommendations:  Problem # 1:  COPD (ICD-496) Assessment Unchanged  Stable COPD Golds Stage IV  plan cont with hospice no change in inhaled meds   Medications Added to Medication List This Visit: 1)  Brovana 15 Mcg/30ml Nebu (Arformoterol tartrate) .... One in neb two times a day 2)  Oxygen  .Marland Kitchen.. 3l cont 3)  Flonase 50 Mcg/act Susp (Fluticasone propionate) .... 2 sprays each nostril at bedtime 4)  Calcium  .... Once daily 5)  Multivitamins Tabs (Multiple vitamin) .... Once daily  Complete Medication List: 1)  Brovana 15 Mcg/67ml Nebu (Arformoterol tartrate) .... One in neb two times a day 2)  Nexium 40 Mg Cpdr (Esomeprazole magnesium) .... One by mouth once a day 3)  Nitro-dur 0.4 Mg/hr Pt24 (Nitroglycerin) .... One on skin once a day 4)  Metoprolol Tartrate 25 Mg Tabs (Metoprolol tartrate) .... Two times a day 5)  Imdur 60 Mg Tb24 (Isosorbide mononitrate) .... 1/2 by mouth once daily 6)  Paxil 10 Mg Tabs (Paroxetine hcl) .... One by mouth once daily 7)  Spiriva Handihaler 18 Mcg Caps (Tiotropium  bromide monohydrate) .... One capsule in handihaler once daily 8)  Diovan 160 Mg Tabs (Valsartan) .... Take 1 tablet by mouth once a day 9)  Proair Hfa 108 (90 Base) Mcg/act Aers (Albuterol sulfate) .Marland Kitchen.. 1-2 puffs every 4-6 hours as needed 10)  Nitrostat 0.4 Mg Subl (Nitroglycerin) .... One sl as needed 11)  Polyethylene Glycol 3350 Oral Powd (Polyethylene glycol 3350) .... Once daily 12)  Furosemide 40 Mg Tabs (Furosemide) .Marland Kitchen.. 1 by mouth daily 13)  Oxygen  .Marland Kitchen.. 3l cont 14)  Tylenol 325 Mg Tabs (Acetaminophen) .... As needed 15)  Delsym 30 Mg/76ml Lqcr (Dextromethorphan polistirex) .... Every 12 hours as needed as directed 16)  Alprazolam 1 Mg Tabs (Alprazolam) .... Three times a day as needed 17)  Lovastatin 40 Mg Tabs (Lovastatin) .... Once daily 18)  Norvasc 5 Mg Tabs (Amlodipine besylate) .... Once daily 19)  Albuterol Sulfate (2.5 Mg/76ml) 0.083% Nebu  (Albuterol sulfate) .... Four times a day as needed 20)  Flonase 50 Mcg/act Susp (Fluticasone propionate) .... 2 sprays each nostril at bedtime 21)  Calcium  .... Once daily 22)  Multivitamins Tabs (Multiple vitamin) .... Once daily  Other Orders: Est. Patient Level III (16109) Prescription Created Electronically [id number])  Patient Instructions: 1)  No change in medications 2)  Return two months Prescriptions: BROVANA 15 MCG/2ML  NEBU (ARFORMOTEROL TARTRATE) one in neb two times a day  #120 vials x 6   Entered and Authorized by:   Storm Frisk MD   Signed by:   Storm Frisk MD on 11/06/2009   Method used:   Electronically to        [facility]  [id number] (retail)       [address]       [place], [place]  [address]       Ph: [phone] or [phone]       Fax: [phone]   RxID:   [id number]

## 2010-07-25 NOTE — Assessment & Plan Note (Signed)
Summary: Pulmonary OV   Primary Provider/Referring Provider:  Cassell Clement  CC:  1 mth. ov-sob on exertion same, cough-prod. white w/small dark spots black, wheezing, chest tightness, and no fcs.  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  The patient has chronic abdominal distention from partial small bowel dysfunction, and obstruction.  Pt now in Hospice   Nov 06, 2009 2:40 PM Two month f/u   Pt notes she is sleeping all the time and turned up oxygen to 3L The pt uses the walker,  if standing will lose balance and fall to side.  Hospice remains helpful. Pt denies any significant sore throat, nasal congestion or excess secretions, fever, chills, sweats, unintended weight loss, pleurtic or exertional chest pain, orthopnea PND, or leg swelling Pt denies any increase in rescue therapy over baseline, denies waking up needing it or having any early am or nocturnal exacerbations of coughing/wheezing/or dyspnea.   January 08, 2010 2:06 PM f/u copd.  Pt notes in throat will gurgle.  Then also noting more memory loss.  Forgetting where she is putting items that are important.  No change in weakness.  The pt did note some tightness in the chest. No change in vision.  Notes some edema in the feet.  Feet continue to swell.  Dyspnea sl worse to same. Notes some mucus, this will occasionally come up on its own.  February 05, 2010 4:38 PM The pt is here for copd f/u.   The pt received at the last ov a pred taper which  helped    Now there is  occ cough at night and is productive,  still gray color.   The spiriva makes pt cough.  The pt is still in the hospice program. March 19, 2010 2:68M Spell of near syncope,  notes more coughing.   Now having more chest pain.  Heart beating fast and fell last week. Notes more edema in LE.  Still in hospice and requalified.    April 19, 2010 12:18 PM Notes more obstruction in abd and will ease off.  Bowels move.  No emesis.  Feels full. Feels tired all the time.  Not much mucus.  Mucus worse after taking rx. Rx albuterol and will expectorate.. [place] the beach  May 28, 2010 12:03 PM f/u one month.  since last ov had a deep cough and hurt and ached in chest and pain bilateral.  Pain worse on R side.  No real fever.  Notes no edema in ankles.  Abd pain is the same to worse.  Can feel very slow for BMs to move.    Preventive Screening-Counseling & Management  Alcohol-Tobacco     Smoking Status: quit     Year Quit: 15 yrs. ago  Current Medications (verified): 1)  Flonase 50 Mcg/act Susp (Fluticasone Propionate) .... 2 Sprays Each Nostril At Bedtime As Needed 2)  Brovana 15 Mcg/69ml  Nebu (Arformoterol Tartrate) .... One in [place] Two Times A Day 3)  Nexium 40 Mg  Cpdr (Esomeprazole Magnesium) .... One By Mouth Once A Day 4)  Nitro-Dur 0.4 Mg/hr  Pt24 (Nitroglycerin) .... One On Skin Once A Day 5)  Metoprolol Tartrate 25 Mg  Tabs (Metoprolol Tartrate) .... Two Times A Day 6)  Imdur 60 Mg  Tb24 (Isosorbide Mononitrate) .... 1/2 By Mouth Once Daily 7)  Paxil 10 Mg  Tabs (Paroxetine Hcl) .... One  By Mouth Once Daily 8)  Spiriva Handihaler 18 Mcg  Caps (Tiotropium Bromide Monohydrate) .... One Capsule in Handihaler Once Daily 9)  Diovan 160 Mg Tabs (Valsartan) .... Take 1/2  Tablet By Mouth Once A Day 10)  Polyethylene Glycol 3350  Oral Powd (Polyethylene Glycol 3350) .... Once Daily 11)  Furosemide 40 Mg Tabs (Furosemide) .Marland Kitchen.. 1 By Mouth Daily 12)  Oxygen .... 3.5l Rest 4-5 L Exertion 13)  Alprazolam 1 Mg Tabs (Alprazolam) .... Three Times A Day and One Every 4 Hours As Needed 14)  Lovastatin 40 Mg Tabs (Lovastatin) .... Once Daily 15)  Norvasc 5 Mg Tabs (Amlodipine Besylate) .... Once Daily 16)  Proair Hfa 108 (90 Base) Mcg/act  Aers (Albuterol Sulfate) .Marland Kitchen.. 1-2 Puffs Every 4-6 Hours As Needed 17)  Albuterol Sulfate (2.5  Mg/33ml) 0.083% Nebu (Albuterol Sulfate) .... Four Times A Day As Needed 18)  Delsym 30 Mg/51ml Lqcr (Dextromethorphan Polistirex) .... Every 12 Hours As Needed As Directed 19)  Tylenol 325 Mg Tabs (Acetaminophen) .... As Needed 20)  Nitrostat 0.4 Mg  Subl (Nitroglycerin) .... One Sl As Needed 21)  Saline Nasal Spray 0.65 % Soln (Saline) .... As Needed 22)  Dry Eyes  Oint (Artificial Tear Ointment) .... As Needed 23)  Tussin 100 Mg/4ml Syrp (Guaifenesin) .Marland Kitchen.. 1-2 Tsp Every 4 Hours As Needed  Allergies (verified): 1)  ! Cipro  Past History:  Family History: Last updated: 09/07/2007 non contrib  Social History: Last updated: 03/19/2010 Patient states former smoker.  Quit in 1990's.  1 1/2 ppd x 40 yrs.  Risk Factors: Smoking Status: quit (05/28/2010)  Past medical, surgical, family and social histories (including risk factors) reviewed, and no changes noted (except as noted below).  Past Medical History: Reviewed history from 06/11/2007 and no changes required. 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 non contrib  Social History: Reviewed history from 03/19/2010 and no changes required. Patient states former smoker.  Quit in 1990's.  1 1/2 ppd x 40 yrs. Smoking Status:  quit  Review of Systems       The  patient complains of shortness of breath with activity, shortness of breath at rest, and abdominal pain.    Vital Signs:  Patient profile:   77 year old female Height:      59 inches Weight:      148.25 pounds O2 Sat:      91 % on 4 L/min Temp:     97.9 degrees F oral Pulse rate:   74 / minute BP sitting:   120 / 62  (left arm) Cuff size:   regular  Vitals Entered By: Elray Buba RN (May 28, 2010 11:53 AM)  O2 Flow:  4 L/min CC: 1 mth. ov-sob on exertion same, cough-prod. white w/small dark spots black,wheezing,chest tightness,no fcs Is Patient Diabetic? No Comments Medications reviewed with patient , phone #'s verifed w/pt.Elray Buba RN  May 28, 2010 11:56 AM  Physical Exam  Additional Exam:  Gen Pt  in no distress , normal affect ENT: no lesions, no post nasal drip Neck: No JVD, no TMG, no carotid bruits Lungs: No use of accessory muscles, no dullness to percussion, distant breath sounds, poor airflow Cardiovascular: RRR, heart sounds normal, no murmurs or gallops, no peripheral edema Abdomen: distended, no HSM, high pitched rush sound c/w partial bowel obstruction Musculoskeletal: No deformities, no cyanosis or clubbing Neuro: alert, non-focal     Impression & Recommendations:  Problem # 1:  SMALL BOWEL OBSTRUCTION, HX OF (ICD-V12.79) Assessment Unchanged  Orders: Est. Patient Level III (57846)  Problem # 2:  COPD (ICD-496)  COPD Golds Stage IV  plan cont with hospice no change in inhaled meds   Medications Added to Medication List This Visit: 1)  Flonase 50 Mcg/act Susp (Fluticasone propionate) .... 2 sprays each nostril at bedtime as needed 2)  Diovan 160 Mg Tabs (Valsartan) .... Take 1/2  tablet by mouth once a day  Complete Medication List: 1)  Flonase 50 Mcg/act Susp (Fluticasone propionate) .... 2 sprays each nostril at bedtime as needed 2)  Brovana 15 Mcg/59ml Nebu (Arformoterol tartrate) .... One in neb two times a day 3)  Nexium 40  Mg Cpdr (Esomeprazole magnesium) .... One by mouth once a day 4)  Nitro-dur 0.4 Mg/hr Pt24 (Nitroglycerin) .... One on skin once a day 5)  Metoprolol Tartrate 25 Mg Tabs (Metoprolol tartrate) .... Two times a day 6)  Imdur 60 Mg Tb24 (Isosorbide mononitrate) .... 1/2 by mouth once daily 7)  Paxil 10 Mg Tabs (Paroxetine hcl) .... One by mouth once daily 8)  Spiriva Handihaler 18 Mcg Caps (Tiotropium bromide monohydrate) .... One capsule in handihaler once daily 9)  Diovan 160 Mg Tabs (Valsartan) .... Take 1/2  tablet by mouth once a day 10)  Polyethylene Glycol 3350 Oral Powd (Polyethylene glycol 3350) .... Once daily 11)  Furosemide 40 Mg Tabs (Furosemide) .Marland Kitchen.. 1 by mouth daily 12)  Oxygen  .... 3.5l rest 4-5 l exertion 13)  Alprazolam 1 Mg Tabs (Alprazolam) .... Three times a day and one every 4 hours as needed 14)  Lovastatin 40 Mg Tabs (Lovastatin) .... Once daily 15)  Norvasc 5 Mg Tabs (Amlodipine besylate) .... Once daily 16)  Proair Hfa 108 (90 Base) Mcg/act Aers (Albuterol sulfate) .Marland Kitchen.. 1-2 puffs every 4-6 hours as needed 17)  Albuterol Sulfate (2.5 Mg/54ml) 0.083% Nebu (Albuterol sulfate) .... Four times a day as needed 18)  Delsym 30 Mg/27ml Lqcr (Dextromethorphan polistirex) .... Every 12 hours as needed as directed 19)  Tylenol 325 Mg Tabs (Acetaminophen) .... As needed 20)  Nitrostat 0.4 Mg Subl (Nitroglycerin) .... One sl as needed 21)  Saline Nasal Spray 0.65 % Soln (Saline) .... As needed 22)  Dry Eyes Oint (Artificial tear ointment) .... As needed 23)  Tussin 100 Mg/29ml Syrp (Guaifenesin) .Marland Kitchen.. 1-2 tsp every 4 hours as needed  Patient Instructions: 1)  No change in medications 2)  Return in     6     weeks

## 2010-07-25 NOTE — Progress Notes (Signed)
Summary: FYI  Phone Note From Other Clinic Call back at [phone]   Caller: jennifer/[facility] Call For: [employer] of Call: FYI:  pt leaving town on 3/31, returning 4/14. Initial call taken by: Darletta Moll,  September 18, 2009 11:00 AM  Follow-up for Phone Call        FYI pt out of town from 09/20/09 to 10/04/09 [place]. hospice has arranged for her to have nurse visits while she is there and she will aslo have aides to help her. They have aslo made arrangements for her to have her oxygen there as well. Carron Curie CMA  September 18, 2009 11:10 AM   Additional Follow-up for Phone Call Additional follow up Details #1::        noted  pw Additional Follow-up by: Storm Frisk MD,  September 18, 2009 12:25 PM

## 2010-07-25 NOTE — Miscellaneous (Signed)
Summary: Orders/Hospice @ [facility]  Orders/Hospice @ [place]   Imported By: Sherian Rein 02/12/2010 10:03:33  _____________________________________________________________________  External Attachment:    Type:   Image     Comment:   External Document

## 2010-07-25 NOTE — Progress Notes (Signed)
Summary: at pharm- waiting on refill nexium  Phone Note Call from Patient   Caller: Patient Call For: WRIGHT Summary of Call: pt is at pharmacy "again" per pt re: nexium. says she really needs her rx refilled. can someone take care of this for her now so she doesn't have to come back to pharmacy? cell [phone] Initial call taken by: Tivis Ringer, CNA,  January 04, 2010 3:10 PM  Follow-up for Phone Call        per EMR,  refill request for nexium dated 12-27-2009 indicates we sent refills x 5.  Called pharmacy and informed them we've already sent this rx electronically 12-27-2009 which they did not receive.  therefore gave verbal order over the phone for # 30 x 4 additional refills.  called and spoke with pt.  pt aware rx sent to pharmacy.  Aundra Millet Reynolds LPN  January 04, 2010 3:15 PM

## 2010-07-25 NOTE — Miscellaneous (Signed)
Summary: Order/Register  Order/[place]   Imported By: Lester Plevna 12/19/2009 11:59:10  _____________________________________________________________________  External Attachment:    Type:   Image     Comment:   External Document

## 2010-07-25 NOTE — Miscellaneous (Signed)
Summary: Plan of Treatment/Hospice @ [facility] of Treatment/Hospice @ [place]   Imported By: Sherian Rein 10/25/2009 11:42:59  _____________________________________________________________________  External Attachment:    Type:   Image     Comment:   External Document

## 2010-07-25 NOTE — Assessment & Plan Note (Signed)
Summary: Pulmonary OV   Primary Provider/Referring Provider:  Cassell Clement  CC:  1 mo follow up.  c/o increased SOB with rest and activity x2-3wks.  also having occasional chest tightness and coughing spells-occasionally productive with gray mucus and increased fatigued.Marland Kitchen  History of Present Illness: Pulmonary OV:   This is a 77 year old, white female with advanced chronic obstructive lung disease with primary emphysematous component.  The patient has chronic abdominal distention from partial small bowel dysfunction, and obstruction.  Pt now in Hospice  April 23, 2009 2:35 PM no changes,  Pt notes cramps into legs and feet,  bone aches,  The dyspnea is the same.  Still with gray mucous.  No nausea or emesis. Bowels move still. Pt is back on lovastatin for hypercholesterolemia  May 29, 2009 10:36 AM Now getting more fatigued.  Still with difficulty with bowels and loud bowel sounds.  Notes more chest pain. Feels weak in the chest and takes NTG sL as needed.  Had sl amount of blood mixed with mucous. No epistaxsis.  Sl ankle edema. The nose feels raw and not opening up. Doing well with Hospice aides and RNs.   July 18, 2009 10:56 AM F/U  getting more tired by the day. Stays dyspneic daily.   After takes spiriva and brovana it helps.  Stays sleepy.  Abd doing ok so far.   Hospice Rns very helpful.   Preventive Screening-Counseling & Management  Alcohol-Tobacco     Smoking Status: quit > 6 months  Current Medications (verified): 1)  Brovana 15 Mcg/42ml  Nebu (Arformoterol Tartrate) .... One in [place] Two Times A Day 2)  Nexium 40 Mg  Cpdr (Esomeprazole Magnesium) .... One By Mouth Once A Day 3)  Nitro-Dur 0.4 Mg/hr  Pt24 (Nitroglycerin) .... One On Skin Once A Day 4)  Metoprolol Tartrate 25 Mg  Tabs (Metoprolol Tartrate) .... Two Times A Day 5)  Imdur 60 Mg  Tb24 (Isosorbide Mononitrate) .... 1/2 By Mouth Once Daily 6)  Paxil 10 Mg  Tabs (Paroxetine Hcl) .... One By Mouth  Once Daily 7)  Spiriva Handihaler 18 Mcg  Caps (Tiotropium Bromide Monohydrate) .... One Capsule in Handihaler Once Daily 8)  Diovan 160 Mg Tabs (Valsartan) .... Take 1 Tablet By Mouth Once A Day 9)  Proair Hfa 108 (90 Base) Mcg/act  Aers (Albuterol Sulfate) .Marland Kitchen.. 1-2 Puffs Every 4-6 Hours As Needed 10)  Nitrostat 0.4 Mg  Subl (Nitroglycerin) .... One Sl As Needed 11)  Polyethylene Glycol 3350  Oral Powd (Polyethylene Glycol 3350) .... Once Daily 12)  Furosemide 40 Mg Tabs (Furosemide) .Marland Kitchen.. 1 By Mouth Daily 13)  Oxygen .... 2.5l Continuous 14)  Tylenol 325 Mg Tabs (Acetaminophen) .... As Needed 15)  Delsym 30 Mg/29ml Lqcr (Dextromethorphan Polistirex) .... Every 12 Hours As Needed As Directed 16)  Alprazolam 1 Mg Tabs (Alprazolam) .... Three Times A Day As Needed 17)  Lovastatin 40 Mg Tabs (Lovastatin) .... Once Daily  Allergies (verified): 1)  ! Cipro  Past History:  Past medical, surgical, family and social histories (including risk factors) reviewed, and no changes noted (except as noted below).  Past Medical History: Reviewed history from 06/11/2007 and no changes required. 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 non contrib  Social History: Reviewed history from 06/11/2007 and no changes required. Patient  states former smoker.   Review of Systems       The patient complains of shortness of breath with activity, shortness of breath at rest, and abdominal pain.  The patient denies productive cough, non-productive cough, coughing up blood, chest pain, irregular heartbeats, acid heartburn, indigestion, loss of appetite, weight change, difficulty swallowing, sore throat, tooth/dental problems, headaches, nasal congestion/difficulty breathing through nose, sneezing, itching, ear ache, anxiety, depression, hand/feet swelling, joint stiffness or pain, rash, change in color of mucus, and fever.    Vital Signs:  Patient profile:   77 year old female Height:      59 inches Weight:      149.13 pounds BMI:     30.23 O2 Sat:      88 % on 3 L/mincont Temp:     98.0 degrees F oral Pulse rate:   66 / minute BP sitting:   112 / 64  (left arm) Cuff size:   regular  Vitals Entered By: Gweneth Dimitri RN (July 18, 2009 10:48 AM)  O2 Flow:  3 L/mincont  O2 Sat Comments Pt arrived to exam room with o2 sat of 88% on 3L cont.  After resting for a few minutes, o2 sat increased to 93% on 3L cont with pulse of 66. Gweneth Dimitri RN  July 18, 2009 10:53 AM  CC: 1 mo follow up.  c/o increased SOB with rest and activity x2-3wks.  also having occasional chest tightness, coughing spells-occasionally productive with gray mucus and increased fatigued. Comments Medications reviewed with patient Daytime contact number verified with patient. Gweneth Dimitri RN  July 18, 2009 10:48 AM    Physical Exam  Additional Exam:  Gen: Pleasant, well-nourished, in no distress , normal affect ENT: no lesions, no post nasal drip Neck: No JVD, no TMG, no carotid bruits Lungs: No use of accessory muscles, no dullness to percussion, distant breath sounds, poor airflow Cardiovascular: RRR, heart sounds normal, no murmurs or gallops, no peripheral edema Abdomen: soft and non-tender, no HSM, high pitched rush sound c/w partial  bowel obstruction Musculoskeletal: No deformities, no cyanosis or clubbing Neuro: alert, non-focal     Impression & Recommendations:  Problem # 1:  COPD (ICD-496) Assessment Unchanged  Stable COPD  but endstage plan cont with hospice no change in inhaled meds   Medications Added to Medication List This Visit: 1)  Oxygen  .... 2.5l continuous  Complete Medication List: 1)  Brovana 15 Mcg/54ml Nebu (Arformoterol tartrate) .... One in neb two times a day 2)  Nexium 40 Mg Cpdr (Esomeprazole magnesium) .... One by mouth once a day 3)  Nitro-dur 0.4 Mg/hr Pt24 (Nitroglycerin) .... One on skin once a day 4)  Metoprolol Tartrate 25 Mg Tabs (Metoprolol tartrate) .... Two times a day 5)  Imdur 60 Mg Tb24 (Isosorbide mononitrate) .... 1/2 by mouth once daily 6)  Paxil 10 Mg Tabs (Paroxetine hcl) .... One by mouth once daily 7)  Spiriva Handihaler 18 Mcg Caps (Tiotropium bromide monohydrate) .... One capsule in handihaler once daily 8)  Diovan 160 Mg Tabs (Valsartan) .... Take 1 tablet by mouth once a day 9)  Proair  Hfa 108 (90 Base) Mcg/act Aers (Albuterol sulfate) .Marland Kitchen.. 1-2 puffs every 4-6 hours as needed 10)  Nitrostat 0.4 Mg Subl (Nitroglycerin) .... One sl as needed 11)  Polyethylene Glycol 3350 Oral Powd (Polyethylene glycol 3350) .... Once daily 12)  Furosemide 40 Mg Tabs (Furosemide) .Marland Kitchen.. 1 by mouth daily 13)  Oxygen  .... 2.5l continuous 14)  Tylenol 325 Mg Tabs (Acetaminophen) .... As needed 15)  Delsym 30 Mg/36ml Lqcr (Dextromethorphan polistirex) .... Every 12 hours as needed as directed 16)  Alprazolam 1 Mg Tabs (Alprazolam) .... Three times a day as needed 17)  Lovastatin 40 Mg Tabs (Lovastatin) .... Once daily  Other Orders: Est. Patient Level III (16109)  Patient Instructions: 1)  No change in medications 2)  Return 4-6 weeks    Appended Document: Pulmonary OV fax [facility] [place]

## 2010-07-25 NOTE — Miscellaneous (Signed)
Summary: Plan of Treatment/Hospice @ [facility] of Treatment/Hospice @ [place]   Imported By: Sherian Rein 12/11/2009 13:11:02  _____________________________________________________________________  External Attachment:    Type:   Image     Comment:   External Document

## 2010-07-25 NOTE — Miscellaneous (Signed)
Summary: Physician's Interim Order/Hospice at [facility] Interim Order/Hospice at [facility]   Imported By: Maryln Gottron 06/26/2009 10:26:12  _____________________________________________________________________  External Attachment:    Type:   Image     Comment:   External Document

## 2010-07-25 NOTE — Assessment & Plan Note (Signed)
Summary: Pulmonary OV   Primary Provider/Referring Provider:  Cassell Clement  CC:  1 month follow up.  Pt states breathing "goes and comes depending on what I'm doing."  Occ wheezing.  Chest tightness.  Prod cough with white mucus and balck specks.   Marland Kitchen  History of Present Illness: Pulmonary OV:   This is a 77year-old, white female with advanced chronic obstructive lung disease with primary emphysematous component.  The patient has chronic abdominal distention from partial small bowel dysfunction, and obstruction.  Pt now in Hospice   Nov 06, 2009 2:40 PM Two month f/u   Pt notes she is sleeping all the time and turned up oxygen to 3L The pt uses the walker,  if standing will lose balance and fall to side.  Hospice remains helpful. Pt denies any significant sore throat, nasal congestion or excess secretions, fever, chills, sweats, unintended weight loss, pleurtic or exertional chest pain, orthopnea PND, or leg swelling Pt denies any increase in rescue therapy over baseline, denies waking up needing it or having any early am or nocturnal exacerbations of coughing/wheezing/or dyspnea.   January 08, 2010 2:06 PM f/u copd.  Pt notes in throat will gurgle.  Then also noting more memory loss.  Forgetting where she is putting items that are important.  No change in weakness.  The pt did note some tightness in the chest. No change in vision.  Notes some edema in the feet.  Feet continue to swell.  Dyspnea sl worse to same. Notes some mucus, this will occasionally come up on its own.  February 05, 2010 4:38 PM The pt is here for copd f/u.   The pt received at the last ov a pred taper which  helped    Now there is  occ cough at night and is productive,  still gray color.   The spiriva makes pt cough.  The pt is still in the hospice program. March 19, 2010 2:77M Spell of near syncope,  notes more coughing.   Now having more chest pain.  Heart beating fast and fell last week. Notes more edema in  LE.  Still in hospice and requalified.    April 19, 2010 12:18 PM Notes more obstruction in abd and will ease off.  Bowels move.  No emesis.  Feels full. Feels tired all the time.  Not much mucus.  Mucus worse after taking rx. Rx albuterol and will expectorate.. went [place] the beach   Current Medications (verified): 1)  Flonase 50 Mcg/act Susp (Fluticasone Propionate) .... 2 Sprays Each Nostril At Bedtime 2)  Brovana 15 Mcg/36ml  Nebu (Arformoterol Tartrate) .... One in [place] Two Times A Day 3)  Nexium 40 Mg  Cpdr (Esomeprazole Magnesium) .... One By Mouth Once A Day 4)  Nitro-Dur 0.4 Mg/hr  Pt24 (Nitroglycerin) .... One On Skin Once A Day 5)  Metoprolol Tartrate 25 Mg  Tabs (Metoprolol Tartrate) .... Two Times A Day 6)  Imdur 60 Mg  Tb24 (Isosorbide Mononitrate) .... 1/2 By Mouth Once Daily 7)  Paxil 10 Mg  Tabs (Paroxetine Hcl) .... One By Mouth Once Daily 8)  Spiriva Handihaler 18 Mcg  Caps (Tiotropium Bromide Monohydrate) .... One Capsule in Handihaler Once Daily 9)  Diovan 160 Mg Tabs (Valsartan) .... Take 1 Tablet By Mouth Once A Day 10)  Polyethylene Glycol 3350  Oral Powd (Polyethylene Glycol 3350) .... Once Daily 11)  Furosemide 40 Mg Tabs (Furosemide) .Marland Kitchen.. 1 By Mouth Daily 12)  Oxygen .... 3.5l Rest  4-5 L Exertion 13)  Alprazolam 1 Mg Tabs (Alprazolam) .... Three Times A Day and One Every 4 Hours As Needed 14)  Lovastatin 40 Mg Tabs (Lovastatin) .... Once Daily 15)  Norvasc 5 Mg Tabs (Amlodipine Besylate) .... Once Daily 16)  Proair Hfa 108 (90 Base) Mcg/act  Aers (Albuterol Sulfate) .Marland Kitchen.. 1-2 Puffs Every 4-6 Hours As Needed 17)  Albuterol Sulfate (2.5 Mg/16ml) 0.083% Nebu (Albuterol Sulfate) .... Four Times A Day As Needed 18)  Delsym 30 Mg/57ml Lqcr (Dextromethorphan Polistirex) .... Every 12 Hours As Needed As Directed 19)  Tylenol 325 Mg Tabs (Acetaminophen) .... As Needed 20)  Nitrostat 0.4 Mg  Subl (Nitroglycerin) .... One Sl As Needed 21)  Saline Nasal Spray 0.65 % Soln (Saline)  .... As Needed 22)  Dry Eyes  Oint (Artificial Tear Ointment) .... As Needed 23)  Tussin 100 Mg/72ml Syrp (Guaifenesin) .Marland Kitchen.. 1-2 Tsp Every 4 Hours As Needed  Allergies (verified): 1)  ! Cipro  Past History:  Past medical, surgical, family and social histories (including risk factors) reviewed, and no changes noted (except as noted below).  Past Medical History: Reviewed history from 06/11/2007 and no changes required. 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 non contrib  Social History: Reviewed history from 03/19/2010 and no changes required. Patient states former smoker.  Quit in 1990's.  1 1/2 ppd x 40 yrs.  Review of Systems       The patient complains of shortness of breath with activity, shortness of breath at rest, non-productive cough, loss of appetite, and weight change.  The patient denies productive cough, coughing up blood, chest pain, irregular heartbeats, acid heartburn, indigestion, abdominal pain, difficulty swallowing, sore throat, tooth/dental problems, headaches, nasal congestion/difficulty breathing through nose, sneezing, itching, ear ache, anxiety, depression, hand/feet swelling, joint stiffness or pain, rash, change in color of mucus, and fever.    Vital Signs:  Patient profile:   77 year  old female Height:      59 inches Weight:      149.38 pounds BMI:     30.28 O2 Sat:      94 % on 4 L/mincont Temp:     97.7 degrees F oral Pulse rate:   64 / minute BP sitting:   104 / 62  (left arm) Cuff size:   regular  Vitals Entered By: Gweneth Dimitri RN (April 19, 2010 12:12 PM)  O2 Flow:  4 L/mincont CC: 1 month follow up.  Pt states breathing "goes and comes depending on what I'm doing."  Occ wheezing.  Chest tightness.  Prod cough with white mucus and balck specks.    Comments Medications reviewed with patient Daytime contact number verified with patient. Gweneth Dimitri RN  April 19, 2010 12:13 PM    Physical Exam  Additional Exam:  Gen Pt  in no distress , normal affect ENT: no lesions, no post nasal drip Neck: No JVD, no TMG, no carotid bruits Lungs: No use of  accessory muscles, no dullness to percussion, distant breath sounds, poor airflow Cardiovascular: RRR, heart sounds normal, no murmurs or gallops, no peripheral edema Abdomen: distended, no HSM, high pitched rush sound c/w partial bowel obstruction Musculoskeletal: No deformities, no cyanosis or clubbing Neuro: alert, non-focal     Impression & Recommendations:  Problem # 1:  COPD (ICD-496) Assessment Unchanged  COPD Golds Stage IV  plan cont with hospice no change in inhaled meds   Medications Added to Medication List This Visit: 1)  Oxygen  .... 3.5l rest 4-5 l exertion 2)  Saline Nasal Spray 0.65 % Soln (Saline) .... As needed 3)  Dry Eyes Oint (Artificial tear ointment) .... As needed 4)  Tussin 100 Mg/30ml Syrp (Guaifenesin) .Marland Kitchen.. 1-2 tsp every 4 hours as needed  Complete Medication List: 1)  Flonase 50 Mcg/act Susp (Fluticasone propionate) .... 2 sprays each nostril at bedtime 2)  Brovana 15 Mcg/18ml Nebu (Arformoterol tartrate) .... One in neb two times a day 3)  Nexium 40 Mg Cpdr (Esomeprazole magnesium) .... One by mouth once a day 4)  Nitro-dur 0.4 Mg/hr Pt24 (Nitroglycerin) .... One on  skin once a day 5)  Metoprolol Tartrate 25 Mg Tabs (Metoprolol tartrate) .... Two times a day 6)  Imdur 60 Mg Tb24 (Isosorbide mononitrate) .... 1/2 by mouth once daily 7)  Paxil 10 Mg Tabs (Paroxetine hcl) .... One by mouth once daily 8)  Spiriva Handihaler 18 Mcg Caps (Tiotropium bromide monohydrate) .... One capsule in handihaler once daily 9)  Diovan 160 Mg Tabs (Valsartan) .... Take 1 tablet by mouth once a day 10)  Polyethylene Glycol 3350 Oral Powd (Polyethylene glycol 3350) .... Once daily 11)  Furosemide 40 Mg Tabs (Furosemide) .Marland Kitchen.. 1 by mouth daily 12)  Oxygen  .... 3.5l rest 4-5 l exertion 13)  Alprazolam 1 Mg Tabs (Alprazolam) .... Three times a day and one every 4 hours as needed 14)  Lovastatin 40 Mg Tabs (Lovastatin) .... Once daily 15)  Norvasc 5 Mg Tabs (Amlodipine besylate) .... Once daily 16)  Proair Hfa 108 (90 Base) Mcg/act Aers (Albuterol sulfate) .Marland Kitchen.. 1-2 puffs every 4-6 hours as needed 17)  Albuterol Sulfate (2.5 Mg/17ml) 0.083% Nebu (Albuterol sulfate) .... Four times a day as needed 18)  Delsym 30 Mg/66ml Lqcr (Dextromethorphan polistirex) .... Every 12 hours as needed as directed 19)  Tylenol 325 Mg Tabs (Acetaminophen) .... As needed 20)  Nitrostat 0.4 Mg Subl (Nitroglycerin) .... One sl as needed 21)  Saline Nasal Spray 0.65 % Soln (Saline) .... As needed 22)  Dry Eyes Oint (Artificial tear ointment) .... As needed 23)  Tussin 100 Mg/1ml Syrp (Guaifenesin) .Marland Kitchen.. 1-2 tsp every 4 hours as needed  Other Orders: Est. Patient Level III (04540)  Patient Instructions: 1)  No change in medications 2)  Return in        1  months     Prevention & Chronic Care Immunizations   Influenza vaccine: Fluvax 3+  (03/19/2010)    Tetanus booster: Not documented    Pneumococcal vaccine: Pneumovax  (03/19/2010)    H. zoster vaccine: Not documented  Colorectal Screening   Hemoccult: Not documented    Colonoscopy: Not documented  Other Screening   Pap smear: Not  documented    Mammogram: Not documented    DXA bone density scan: Not documented   Smoking status: quit > 6 months  (03/19/2010)  Lipids   Total Cholesterol: Not documented   LDL: Not documented   LDL Direct: Not documented  HDL: Not documented   Triglycerides: Not documented  Hypertension   Last Blood Pressure: 104 / 62  (04/19/2010)   Serum creatinine: 0.9  (04/23/2009)   Serum potassium 3.7  (04/23/2009)  Self-Management Support :    Hypertension self-management support: Not documented

## 2010-07-26 NOTE — Letter (Signed)
Summary: Statement of Medical Necessity/ [facility]  Statement of Medical Necessity/ [facility]   Imported By: Lennie Odor 03/21/2010 10:49:11  _____________________________________________________________________  External Attachment:    Type:   Image     Comment:   External Document

## 2010-07-29 ENCOUNTER — Encounter: Payer: Self-pay | Admitting: Critical Care Medicine

## 2010-07-31 ENCOUNTER — Telehealth: Payer: Self-pay | Admitting: Critical Care Medicine

## 2010-07-31 NOTE — Miscellaneous (Signed)
Summary: Order/[place]  Order/[place]   Imported By: Lester [place] 07/24/2010 11:08:36  _____________________________________________________________________  External Attachment:    Type:   Image     Comment:   External Document

## 2010-08-08 NOTE — Miscellaneous (Signed)
Summary: Plan/[place]  Plan/   Imported By: Lester Hyder 08/02/2010 13:50:05  _____________________________________________________________________  External Attachment:    Type:   Image     Comment:   External Document

## 2010-08-08 NOTE — Progress Notes (Signed)
Summary: ? divertics flare/Hospice call/treatment plan  Phone Note Other Incoming Call back at [phone]   Caller: teresa griffin//hospice Summary of Call: Wants to speak to nurse suspects pt may have a flare-up of diverticulitis says she's in a lot of pain pls advise. Initial call taken by: Darletta Moll,  July 31, 2010 11:41 AM  Follow-up for Phone Call        [place] and spoke with Rosey Bath.  She states that she believes pt is having diverticulitis flare- c/o nausea, left side abd pain, diminished bowel sounds.  She had BM this am with scant amount of greenish black stool.  No fever.  Pls advise thanks! Follow-up by: Vernie Murders,  July 31, 2010 12:02 PM  Additional Follow-up for Phone Call Additional follow up Details #1::        this is not diverticulitis  dr Juanda Chance is her GI MD   this is likely due to small bowel obstruction  she needs to be kept comfortable please    Additional Follow-up by: Storm Frisk MD,  July 31, 2010 12:39 PM    Additional Follow-up for Phone Call Additional follow up Details #2::    Spoke with Rosey Bath who advised pt is not on pain meds, can give Tylenol. Wants to know if PW will give a verbal order for symptom management for their MD can care for pt when problems like this come up or if he will advise treatment plan? Pt has taken Miralax for the day. Please advise. Thanks. Zackery Barefoot CMA  July 31, 2010 12:48 PM     Rosey Bath called back and requests an answer asap. I explained to Darl Pikes to tell Rosey Bath that PW is gone for the day and the doc of the day [facility]) will need to answer for [place]. Reynaldo Minium CMA  July 31, 2010 2:50 PM   Additional Follow-up for Phone Call Additional follow up Details #3:: Details for Additional Follow-up Action Taken: ok for symptom management per hospice md  Rosey Bath informed of above. Zackery Barefoot CMA  July 31, 2010 3:11 PM  Additional Follow-up by: Barbaraann Share MD,  July 31, 2010 3:06  PM

## 2010-09-11 ENCOUNTER — Encounter: Payer: Self-pay | Admitting: *Deleted

## 2010-09-18 ENCOUNTER — Ambulatory Visit: Payer: Self-pay | Admitting: Critical Care Medicine

## 2010-09-19 ENCOUNTER — Ambulatory Visit: Payer: Self-pay | Admitting: Critical Care Medicine

## 2010-09-20 ENCOUNTER — Ambulatory Visit (INDEPENDENT_AMBULATORY_CARE_PROVIDER_SITE_OTHER): Payer: Medicare Other | Admitting: Critical Care Medicine

## 2010-09-20 ENCOUNTER — Encounter: Payer: Self-pay | Admitting: Critical Care Medicine

## 2010-09-20 DIAGNOSIS — J449 Chronic obstructive pulmonary disease, unspecified: Secondary | ICD-10-CM

## 2010-09-20 NOTE — Patient Instructions (Signed)
Use tylenol for pain , use xanax for muscle cramps and pain. No other medication changes Return 4-6 weeks

## 2010-09-20 NOTE — Assessment & Plan Note (Signed)
End stage copd Plan Cont in hospice program nochange in inhaled meds

## 2010-09-20 NOTE — Progress Notes (Signed)
Subjective:    Patient ID: [name], female    DOB: [birth year], 77 y.o.   MRN: [id number]  Shortness of Breath This is a chronic problem. The current episode started more than 1 year ago. The problem occurs daily. The problem has been unchanged. Associated symptoms include abdominal pain and vomiting. Pertinent negatives include no fever, leg swelling, orthopnea, PND, sore throat, sputum production or swollen glands.   This is a 77year-old, white female with advanced chronic obstructive lung disease with primary emphysematous component. The patient has chronic abdominal distention from partial small bowel dysfunction, and obstruction. Pt now in [facility]   09/20/2010 F/u copd severe.  Dyspnea is worse  Esp with any activity.  Still with some abdominal pain. Had spell of emesis and blockage. Has to work with husband with OBS, loses his temper   Past Medical History  Diagnosis Date  . Von Willebrand's disease 03/25/2007  . Adjustment disorder with anxiety 06/11/2007  . HYPERTENSION 03/25/2007  . CORONARY ARTERY DISEASE 03/25/2007  . COPD 03/25/2007  . GERD 03/25/2007  . DIVERTICULOSIS, COLON 03/25/2007  . OSTEOPOROSIS 03/25/2007  . URINARY RETENTION 07/18/2008  . CEREBROVASCULAR ACCIDENT, HX OF 03/25/2007  . COLONIC POLYPS, HX OF 03/25/2007  . SMALL BOWEL OBSTRUCTION, HX OF 06/11/2007  . CHOLECYSTECTOMY, HX OF 03/25/2007  . CAROTID ENDARTERECTOMY, HX OF 06/23/1992     History reviewed. No pertinent family history.   History   Social History  . Marital Status: Married    Spouse Name: N/A    Number of Children: N/A  . Years of Education: N/A   Occupational History  . Not on file.   Social History Main Topics  . Smoking status: Former Smoker -- 1.5 packs/day for 40 years    Quit date: 06/23/1988  . Smokeless tobacco: Never Used  . Alcohol Use: Yes     2 glass each night  . Drug Use: No  . Sexually Active: Not on file   Other Topics Concern  . Not on file   Social History  Narrative  . No narrative on file     Allergies  Allergen Reactions  . Ciprofloxacin      Outpatient Prescriptions Prior to Visit  Medication Sig Dispense Refill  . acetaminophen (TYLENOL) 325 MG tablet Take 650 mg by mouth every 6 (six) hours as needed.        . ALPRAZolam (XANAX) 1 MG tablet Take 1 mg by mouth 3 (three) times daily as needed.        Marland Kitchen amLODipine (NORVASC) 5 MG tablet Take 5 mg by mouth daily.        Marland Kitchen arformoterol (BROVANA) 15 MCG/2ML NEBU Take 15 mcg by nebulization 2 (two) times daily.        . Artificial Tear Ointment (DRY EYES) OINT Apply to eye as needed.        Marland Kitchen dextromethorphan (DELSYM) 30 MG/5ML liquid Take 60 mg by mouth as needed.        Marland Kitchen esomeprazole (NEXIUM) 40 MG capsule Take 40 mg by mouth daily before breakfast.        . furosemide (LASIX) 40 MG tablet Take 40 mg by mouth daily.        Marland Kitchen guaifenesin (ROBITUSSIN) 100 MG/5ML syrup Take 200 mg by mouth as needed.        . lovastatin (MEVACOR) 40 MG tablet Take 40 mg by mouth daily.        . metoprolol tartrate (LOPRESSOR) 25 MG tablet Take  25 mg by mouth 2 (two) times daily.        . nitroGLYCERIN (NITROSTAT) 0.4 MG SL tablet Place 0.4 mg under the tongue every 5 (five) minutes as needed.        Marland Kitchen PARoxetine (PAXIL) 10 MG tablet Take 10 mg by mouth every morning.        . polyethylene glycol (MIRALAX / GLYCOLAX) packet Take 17 g by mouth daily.        . sodium chloride (OCEAN) 0.65 % nasal spray 1 spray by Nasal route as needed.        . tiotropium (SPIRIVA) 18 MCG inhalation capsule Place 18 mcg into inhaler and inhale daily.        Marland Kitchen albuterol (PROVENTIL) (2.5 MG/3ML) 0.083% nebulizer solution Take 2.5 mg by nebulization.        . fluticasone (FLONASE) 50 MCG/ACT nasal spray by Nasal route at bedtime.        . isosorbide mononitrate (IMDUR) 60 MG 24 hr tablet Take 60 mg by mouth daily. Take 1/2 by mouth daily        . nitroGLYCERIN (NITRODUR) 0.4 mg/hr Place 1 patch onto the skin daily.        .  valsartan (DIOVAN) 160 MG tablet Take 160 mg by mouth daily.           Review of Systems  Constitutional: Negative for fever.  HENT: Negative for sore throat.   Respiratory: Positive for shortness of breath. Negative for sputum production.   Cardiovascular: Negative for orthopnea, leg swelling and PND.  Gastrointestinal: Positive for vomiting and abdominal pain.       Objective:   Physical Exam Gen: Pleasant, well-nourished, in no distress,  normal affect  ENT: No lesions,  mouth clear,  oropharynx clear, no postnasal drip  Neck: No JVD, no TMG, no carotid bruits  Lungs: No use of accessory muscles, no dullness to percussion, distant bs Cardiovascular: RRR, heart sounds normal, no murmur or gallops, no peripheral edema  Abdomen:distended , bs decreased  Musculoskeletal: No deformities, no cyanosis or clubbing  Neuro: alert, non focal  Skin: Warm, no lesions or rashes         Assessment & Plan:   COPD End stage copd Plan Cont in hospice program nochange in inhaled meds    Updated Medication List Outpatient Encounter Prescriptions as of 09/20/2010  Medication Sig Dispense Refill  . acetaminophen (TYLENOL) 325 MG tablet Take 650 mg by mouth every 6 (six) hours as needed.        Marland Kitchen albuterol (PROVENTIL) (2.5 MG/3ML) 0.083% nebulizer solution Use four times daily as needed  75 mL    . ALPRAZolam (XANAX) 1 MG tablet Take 1 mg by mouth 3 (three) times daily as needed.        Marland Kitchen amLODipine (NORVASC) 5 MG tablet Take 5 mg by mouth daily.        Marland Kitchen arformoterol (BROVANA) 15 MCG/2ML NEBU Take 15 mcg by nebulization 2 (two) times daily.        . Artificial Tear Ointment (DRY EYES) OINT Apply to eye as needed.        Marland Kitchen dextromethorphan (DELSYM) 30 MG/5ML liquid Take 60 mg by mouth as needed.        Marland Kitchen esomeprazole (NEXIUM) 40 MG capsule Take 40 mg by mouth daily before breakfast.        . fluticasone (FLONASE) 50 MCG/ACT nasal spray 2 sprays by Nasal route at bedtime as needed  for  rhinitis.  16 g    . furosemide (LASIX) 40 MG tablet Take 40 mg by mouth daily.        Marland Kitchen guaifenesin (ROBITUSSIN) 100 MG/5ML syrup Take 200 mg by mouth as needed.        . isosorbide mononitrate (IMDUR) 60 MG 24 hr tablet Take 1 tablet (60 mg total) by mouth daily.      Marland Kitchen lovastatin (MEVACOR) 40 MG tablet Take 40 mg by mouth daily.        . metoprolol tartrate (LOPRESSOR) 25 MG tablet Take 25 mg by mouth 2 (two) times daily.        . nitroGLYCERIN (NITROSTAT) 0.4 MG SL tablet Place 0.4 mg under the tongue every 5 (five) minutes as needed.        Marland Kitchen PARoxetine (PAXIL) 10 MG tablet Take 10 mg by mouth every morning.        . polyethylene glycol (MIRALAX / GLYCOLAX) packet Take 17 g by mouth daily.        . prochlorperazine (COMPAZINE) 10 MG tablet Take 1 tablet by mouth every 6 hours as needed for nausea and vomiting      . sodium chloride (OCEAN) 0.65 % nasal spray 1 spray by Nasal route as needed.        . tiotropium (SPIRIVA) 18 MCG inhalation capsule Place 18 mcg into inhaler and inhale daily.        . valsartan (DIOVAN) 160 MG tablet Take 1/2 tablet by mouth once daily  30 tablet  11  . DISCONTD: albuterol (PROVENTIL) (2.5 MG/3ML) 0.083% nebulizer solution Take 2.5 mg by nebulization.        Marland Kitchen DISCONTD: fluticasone (FLONASE) 50 MCG/ACT nasal spray by Nasal route at bedtime.        Marland Kitchen DISCONTD: isosorbide mononitrate (IMDUR) 60 MG 24 hr tablet Take 60 mg by mouth daily. Take 1/2 by mouth daily        . DISCONTD: nitroGLYCERIN (NITRODUR) 0.4 mg/hr Place 1 patch onto the skin daily.        Marland Kitchen DISCONTD: valsartan (DIOVAN) 160 MG tablet Take 160 mg by mouth daily.

## 2010-09-21 ENCOUNTER — Other Ambulatory Visit: Payer: Self-pay | Admitting: Cardiology

## 2010-09-21 DIAGNOSIS — I10 Essential (primary) hypertension: Secondary | ICD-10-CM

## 2010-10-14 ENCOUNTER — Ambulatory Visit (INDEPENDENT_AMBULATORY_CARE_PROVIDER_SITE_OTHER): Payer: Medicare Other | Admitting: Internal Medicine

## 2010-10-14 ENCOUNTER — Encounter: Payer: Self-pay | Admitting: Internal Medicine

## 2010-10-14 DIAGNOSIS — E785 Hyperlipidemia, unspecified: Secondary | ICD-10-CM

## 2010-10-14 DIAGNOSIS — J449 Chronic obstructive pulmonary disease, unspecified: Secondary | ICD-10-CM

## 2010-10-14 DIAGNOSIS — N760 Acute vaginitis: Secondary | ICD-10-CM

## 2010-10-14 DIAGNOSIS — L301 Dyshidrosis [pompholyx]: Secondary | ICD-10-CM | POA: Insufficient documentation

## 2010-10-14 DIAGNOSIS — J4489 Other specified chronic obstructive pulmonary disease: Secondary | ICD-10-CM

## 2010-10-14 DIAGNOSIS — I10 Essential (primary) hypertension: Secondary | ICD-10-CM

## 2010-10-14 MED ORDER — TRIAMCINOLONE ACETONIDE 0.5 % EX OINT: TOPICAL_OINTMENT | Freq: Two times a day (BID) | CUTANEOUS | Status: DC

## 2010-10-14 MED ORDER — NYSTATIN 100000 UNIT/GM EX POWD: Freq: Three times a day (TID) | CUTANEOUS | Status: DC

## 2010-10-14 NOTE — Assessment & Plan Note (Signed)
Affects hands and feet - try med-hig pot triamcin oint - erx done

## 2010-10-14 NOTE — Assessment & Plan Note (Signed)
Follows with pulm and home hospice - The current medical regimen is effective;  continue present plan and medications.

## 2010-10-14 NOTE — Patient Instructions (Signed)
It was good to see you today. We have reviewed your prior records including labs and tests today Medications reviewed, no changes at this time but will add 2 as discussed for skin and itch - see below. Your prescription(s) have been submitted to your pharmacy. Please take as directed and contact our office if you believe you are having problem(s) with the medication(s). Please schedule followup in 6 months, call sooner if problems.

## 2010-10-14 NOTE — Progress Notes (Signed)
Subjective:    Patient ID: [name], female    DOB: May 14, 1928, 77 y.o.   MRN: [id number]  HPI New pt to me and our division, known to our practice (PW, DB) - here to establish care Reviewed chronic medical issues:  COPD - end stage, O2 dep - follows regularly with pulmonary and home hospice - the patient reports compliance with medication(s) as prescribed. Denies adverse side effects.  HTN - the patient reports compliance with medication(s) as prescribed. Denies adverse side effects.  Dyslipidemia - the patient reports compliance with medication(s) as prescribed. Denies adverse side effects.  Hx CVA and CEA, right in 1991 - no ASA due to vW dz - no residual deficits  Past Medical History  Diagnosis Date  . Von Willebrand's disease   . CORONARY ARTERY DISEASE   . DIVERTICULOSIS, COLON   . CEREBROVASCULAR ACCIDENT, HX OF 1991  . SMALL BOWEL OBSTRUCTION, HX OF   . CAROTID ENDARTERECTOMY, HX OF 06/23/1992  . COPD     end stage, O2 dep  . OSTEOPOROSIS   . HYPERTENSION   . GERD   . Depression   . Hyperlipidemia     Family History  Problem Relation Age of Onset  . Arthritis Mother   . Arthritis Father   . Heart disease Father   . Hyperlipidemia Father   . Hypertension Father    History   Social History  . Marital Status: Married    Spouse Name: N/A    Number of Children: N/A  . Years of Education: N/A   Occupational History  . Not on file.   Social History Main Topics  . Smoking status: Former Smoker -- 1.5 packs/day for 40 years    Quit date: 06/23/1988  . Smokeless tobacco: Never Used  . Alcohol Use: Yes     2 glass each night  . Drug Use: No  . Sexually Active: Not Currently   Other Topics Concern  . Not on file   Social History Narrative  . No narrative on file     Review of Systems complains of itch and discomfort between legs, not dysuria; also complains of itense itch on palms and soles related to rash and "blistering" of skin Otherwise,  Negative for fever, chest pain, or abdominal pain. Negative for gait problem or dizziness.  No other specific complaints in a complete review of systems (except as listed in HPI above).     Objective:   Physical Exam BP 148/62  Pulse 70  Temp(Src) 98.2 F (36.8 C) (Oral)  Ht 4\' 11"  (1.499 m)  Wt 148 lb (67.132 kg)  BMI 29.89 kg/m2  SpO2 95% Physical Exam  Constitutional: She is oriented to person, place, and time. She appears well-developed and well-nourished. No distress at rest. Niece at side.  HENT: Head: Normocephalic and atraumatic. Ears: grossly normal hearing bilaterally, TMs clear. Nose: Nose normal.  Mouth/Throat: Oropharynx is clear and moist. No oropharyngeal exudate.  Eyes: Conjunctivae and EOM are normal. Pupils are equal, round, and reactive to light. No scleral icterus.  Neck: Normal range of motion. Neck supple. No JVD present. No thyromegaly present.  Cardiovascular: Normal rate, regular rhythm and normal heart sounds.  No murmur heard. Pulmonary/Chest:  No increase work of breathing at rest, good effort but diminished breath sounds bilaterallyl. No active respiratory distress. She has no wheezes.  Abdominal: Soft. Bowel sounds are normal. She exhibits no distension. There is no tenderness.  Musculoskeletal: Grossly normal range of motion. She exhibits  no edema.  Neurological: She is alert and oriented to person, place, and time. No cranial nerve deficit. Coordination normal.  Skin: Skin is warm and dry. Dyshidrotic ezema rash noted on bilateral palms and soles with cracking and erythema.  Psychiatric: She has a normal mood and affect, very pleasant. Her behavior is normal. Judgment and thought content normal.   Lab Results  Component Value Date   WBC 7.6 07/18/2008   HGB 12.1 07/18/2008   HCT 34.5* 07/18/2008   PLT 221 07/18/2008   NA 141 04/23/2009   K 3.7 04/23/2009   CL 100 04/23/2009   CREATININE 0.9 04/23/2009   BUN 14 04/23/2009   CO2 36* 04/23/2009        Assessment & Plan:  See problem list. Medications and labs reviewed today. Time spent with pt/family today 45 minutes, greater than 50% time spent counseling patient on COPD, rash, itch and medication review. Also review of prior records

## 2010-10-14 NOTE — Assessment & Plan Note (Signed)
Follows with cards for same - no med changes today due to variable BP BP Readings from Last 3 Encounters:  10/14/10 148/62  09/20/10 106/52  07/10/10 94/56

## 2010-10-15 ENCOUNTER — Encounter: Payer: Self-pay | Admitting: Internal Medicine

## 2010-10-15 DIAGNOSIS — E785 Hyperlipidemia, unspecified: Secondary | ICD-10-CM | POA: Insufficient documentation

## 2010-10-15 NOTE — Assessment & Plan Note (Signed)
Suspect candida - tx nystatin powder - erx done - to call if symptoms unimproved

## 2010-10-15 NOTE — Assessment & Plan Note (Signed)
On statin. Follows with cards for same ([facility]) The current medical regimen is effective;  continue present plan and medications.

## 2010-10-25 ENCOUNTER — Encounter: Payer: Self-pay | Admitting: Critical Care Medicine

## 2010-10-29 ENCOUNTER — Ambulatory Visit (INDEPENDENT_AMBULATORY_CARE_PROVIDER_SITE_OTHER): Payer: Medicare Other | Admitting: Critical Care Medicine

## 2010-10-29 ENCOUNTER — Encounter: Payer: Self-pay | Admitting: Critical Care Medicine

## 2010-10-29 DIAGNOSIS — J449 Chronic obstructive pulmonary disease, unspecified: Secondary | ICD-10-CM

## 2010-10-29 NOTE — Patient Instructions (Signed)
No change in medications. Return in        6 weeks  

## 2010-10-29 NOTE — Progress Notes (Signed)
Subjective:    Patient ID: [name], female    DOB: Jul 08, 1927, 77 y.o.   MRN: [id number]  HPI  This is a 76 y.o.   white female with advanced chronic obstructive lung disease with primary emphysematous component. The patient has chronic abdominal distention from partial small bowel dysfunction, and obstruction. Pt now in [facility]  09/20/2010  F/u copd severe. Dyspnea is worse Esp with any activity. Still with some abdominal pain. Had spell of emesis and blockage.  Has to work with husband with OBS, loses his temper   10/29/2010 Note is getting weaker.  Legs give out.  Pt is falling more.  Trying to hold on to a rail and fell. Denies excess mucus.  If takes neb med will cough up mucus.    Past Medical History  Diagnosis Date  . Von Willebrand's disease   . CORONARY ARTERY DISEASE   . DIVERTICULOSIS, COLON   . CEREBROVASCULAR ACCIDENT, HX OF 1991  . SMALL BOWEL OBSTRUCTION, HX OF   . CAROTID ENDARTERECTOMY, HX OF 06/23/1992  . COPD     end stage, O2 dep  . OSTEOPOROSIS   . HYPERTENSION   . GERD   . Depression   . Hyperlipidemia      Family History  Problem Relation Age of Onset  . Arthritis Mother   . Arthritis Father   . Heart disease Father   . Hyperlipidemia Father   . Hypertension Father      History   Social History  . Marital Status: Married    Spouse Name: N/A    Number of Children: N/A  . Years of Education: N/A   Occupational History  . Not on file.   Social History Main Topics  . Smoking status: Former Smoker -- 1.5 packs/day for 40 years    Quit date: 06/23/1988  . Smokeless tobacco: Never Used  . Alcohol Use: Yes     2 glass each night  . Drug Use: No  . Sexually Active: Not Currently   Other Topics Concern  . Not on file   Social History Narrative  . No narrative on file     Allergies  Allergen Reactions  . Ciprofloxacin      Outpatient Prescriptions Prior to Visit  Medication Sig Dispense Refill  . acetaminophen (TYLENOL) 325  MG tablet Take 650 mg by mouth every 6 (six) hours as needed.        Marland Kitchen albuterol (PROVENTIL) (2.5 MG/3ML) 0.083% nebulizer solution Use four times daily as needed  75 mL    . ALPRAZolam (XANAX) 1 MG tablet Take 1 mg by mouth 3 (three) times daily as needed.        Marland Kitchen amLODipine (NORVASC) 5 MG tablet TAKE 1 TABLET BY MOUTH EVERY DAY  30 tablet  12  . arformoterol (BROVANA) 15 MCG/2ML NEBU Take 15 mcg by nebulization 2 (two) times daily.        . Artificial Tear Ointment (DRY EYES) OINT Apply to eye as needed.        Marland Kitchen dextromethorphan (DELSYM) 30 MG/5ML liquid Take 60 mg by mouth as needed.        Marland Kitchen esomeprazole (NEXIUM) 40 MG capsule Take 40 mg by mouth daily before breakfast.        . fluticasone (FLONASE) 50 MCG/ACT nasal spray 2 sprays by Nasal route at bedtime as needed for rhinitis.  16 g    . furosemide (LASIX) 40 MG tablet Take 40 mg by mouth daily.        Marland Kitchen  guaifenesin (ROBITUSSIN) 100 MG/5ML syrup Take 200 mg by mouth as needed.        . isosorbide mononitrate (IMDUR) 60 MG 24 hr tablet Take 1 tablet (60 mg total) by mouth daily.      Marland Kitchen lovastatin (MEVACOR) 40 MG tablet Take 40 mg by mouth daily.        . metoprolol tartrate (LOPRESSOR) 25 MG tablet Take 25 mg by mouth 2 (two) times daily.        . nitroGLYCERIN (NITROSTAT) 0.4 MG SL tablet Place 0.4 mg under the tongue every 5 (five) minutes as needed.        . nystatin (MYCOSTATIN) powder Apply topically 3 (three) times daily. To affected area between legs  60 g  0  . PARoxetine (PAXIL) 10 MG tablet Take 10 mg by mouth every morning.        . polyethylene glycol (MIRALAX / GLYCOLAX) packet Take 17 g by mouth daily.        . prochlorperazine (COMPAZINE) 10 MG tablet Take 1 tablet by mouth every 6 hours as needed for nausea and vomiting      . sodium chloride (OCEAN) 0.65 % nasal spray 1 spray by Nasal route as needed.        . tiotropium (SPIRIVA) 18 MCG inhalation capsule Place 18 mcg into inhaler and inhale daily.        Marland Kitchen  triamcinolone (KENALOG) 0.5 % ointment Apply topically 2 (two) times daily. To affected skin (hands and feet)  30 g  0  . valsartan (DIOVAN) 160 MG tablet Take 1/2 tablet by mouth once daily  30 tablet  11      Review of Systems Constitutional:   No  weight loss, night sweats,  Fevers, chills, fatigue, lassitude. HEENT:   No headaches,  Difficulty swallowing,  Tooth/dental problems,  Sore throat,                No sneezing, itching, ear ache, nasal congestion, post nasal drip,   CV:  Notes anginal  chest pain,  Orthopnea, PND, swelling in lower extremities, anasarca,  Notes dizziness, no palpitations  GI  No heartburn, indigestion, abdominal pain, nausea, vomiting, diarrhea, change in bowel habits, loss of appetite  Resp: Notes  shortness of breath with exertion and  at rest.  No excess mucus, no productive cough,  No non-productive cough,  No coughing up of blood.  No change in color of mucus.  No wheezing.  No chest wall deformity  Skin: no rash or lesions.  GU: no dysuria, change in color of urine, no urgency or frequency.  No flank pain.  MS:  No joint pain or swelling.  No decreased range of motion.  No back pain.  Psych:  No change in mood or affect. No depression or anxiety.  No memory loss.     Objective:   Physical Exam Filed Vitals:   10/29/10 1226  BP: 130/60  Pulse: 73  Temp: 97.7 F (36.5 C)  TempSrc: Oral  Height: 4\' 11"  (1.499 m)  Weight: 148 lb 12.8 oz (67.495 kg)  SpO2: 93%    Gen: Pleasant, well-nourished, in no distress,  normal affect  ENT: No lesions,  mouth clear,  oropharynx clear, no postnasal drip  Neck: No JVD, no TMG, no carotid bruits  Lungs: No use of accessory muscles, no dullness to percussion, distant bs , poor airflow  Cardiovascular: RRR, heart sounds normal, no murmur or gallops, no peripheral edema  Abdomen: soft  and NT, no HSM,  BS normal  Musculoskeletal: No deformities, no cyanosis or clubbing  Neuro: alert, non  focal  Skin: Warm, no lesions or rashes        Assessment & Plan:   COPD End stage copd golds IV Plan No change in medications. Return in      6 weeks Needs to use rollalator more    Updated Medication List Outpatient Encounter Prescriptions as of 10/29/2010  Medication Sig Dispense Refill  . acetaminophen (TYLENOL) 325 MG tablet Take 650 mg by mouth every 6 (six) hours as needed.        Marland Kitchen albuterol (PROVENTIL) (2.5 MG/3ML) 0.083% nebulizer solution Use four times daily as needed  75 mL    . ALPRAZolam (XANAX) 1 MG tablet Take 1 mg by mouth 3 (three) times daily as needed.        Marland Kitchen amLODipine (NORVASC) 5 MG tablet TAKE 1 TABLET BY MOUTH EVERY DAY  30 tablet  12  . arformoterol (BROVANA) 15 MCG/2ML NEBU Take 15 mcg by nebulization 2 (two) times daily.        . Artificial Tear Ointment (DRY EYES) OINT Apply to eye as needed.        Marland Kitchen dextromethorphan (DELSYM) 30 MG/5ML liquid Take 60 mg by mouth as needed.        Marland Kitchen esomeprazole (NEXIUM) 40 MG capsule Take 40 mg by mouth daily before breakfast.        . fluticasone (FLONASE) 50 MCG/ACT nasal spray 2 sprays by Nasal route at bedtime as needed for rhinitis.  16 g    . furosemide (LASIX) 40 MG tablet Take 40 mg by mouth daily.        Marland Kitchen guaifenesin (ROBITUSSIN) 100 MG/5ML syrup Take 200 mg by mouth as needed.        . isosorbide mononitrate (IMDUR) 60 MG 24 hr tablet Take 1 tablet (60 mg total) by mouth daily.      Marland Kitchen lovastatin (MEVACOR) 40 MG tablet Take 40 mg by mouth daily.        . metoprolol tartrate (LOPRESSOR) 25 MG tablet Take 25 mg by mouth 2 (two) times daily.        . nitroGLYCERIN (NITROSTAT) 0.4 MG SL tablet Place 0.4 mg under the tongue every 5 (five) minutes as needed.        . nystatin (MYCOSTATIN) powder Apply topically 3 (three) times daily. To affected area between legs  60 g  0  . PARoxetine (PAXIL) 10 MG tablet Take 10 mg by mouth every morning.        . polyethylene glycol (MIRALAX / GLYCOLAX) packet Take 17 g by  mouth daily.        . prochlorperazine (COMPAZINE) 10 MG tablet Take 1 tablet by mouth every 6 hours as needed for nausea and vomiting      . sodium chloride (OCEAN) 0.65 % nasal spray 1 spray by Nasal route as needed.        . tiotropium (SPIRIVA) 18 MCG inhalation capsule Place 18 mcg into inhaler and inhale daily.        Marland Kitchen triamcinolone (KENALOG) 0.5 % ointment Apply topically 2 (two) times daily. To affected skin (hands and feet)  30 g  0  . valsartan (DIOVAN) 160 MG tablet Take 1/2 tablet by mouth once daily  30 tablet  11

## 2010-10-29 NOTE — Assessment & Plan Note (Signed)
End stage copd golds IV Plan No change in medications. Return in      6 weeks Needs to use rollalator more

## 2010-11-05 NOTE — Assessment & Plan Note (Signed)
[facility]                             PULMONARY OFFICE NOTE   [name], [name]                    MRN:          [id number]  DATE:02/08/2007                            DOB:          July 20, 1927    [name] is a 77 year old white female with end stage chronic  obstructive lung disease.  Patient notes continues dyspnea and cough  productive of a beige mucus, increased level of dyspnea.   Patient is on:  1. Spiriva daily.  2. Fluticasone 2 sprays each nostril daily.  3. Nexium 40 mg daily.  4. Brovana b.i.d. by nebulization.  5. Oxygen 2 liters continuous.   EXAMINATION:  Temperature 97, blood pressure 114/80, pulse 69,  saturation 97% on 2 liters.  CHEST:  Showed inspiratory and expiratory wheeze, poor airflow.  CARDIAC:  Showed a regular rate and rhythm without S3, normal S1-S2.  ABDOMEN:  Soft, nontender.  EXTREMITIES:  Showed no edema or clubbing.  SKIN:  Clear.  NEUROLOGIC:  Intact.   IMPRESSION:  On this patient is that of chronic obstructive lung  disease, asthmatic bronchitic flare, acute bronchitis.   PLAN:  Patient to receive Avelox 400 mg a day for a 5 day course and as  well to receive pulse prednisone and we will see the patient back in  followup in 3 weeks.     Charlcie Cradle Delford Field, MD, [facility]  Electronically Signed    PEW/MedQ  DD: 02/08/2007  DT: 02/09/2007  Job #: [id number]   cc:   Cassell Clement, M.D.

## 2010-11-05 NOTE — Assessment & Plan Note (Signed)
[facility]                             PULMONARY OFFICE NOTE   SAMERA, MACY                    MRN:          [id number]  DATE:03/30/2007                            DOB:          [birth year]    Ms. Jarquin is a 77 year old female with history of COPD, chronic  obstructive lung disease, minimal cough.  Patient is noting no real  chest pain, mucus production is less.   She maintains  1. Brovana b.i.d. by nebulization.  2. Nexium 40 mg daily.  3. Spiriva daily.   PHYSICAL EXAMINATION:  VITAL SIGNS:  Temperature 98, blood pressure  118/76, pulse 70, saturation 96% on 2 liters.  CHEST:  Distant breath sounds with prolonged expiratory phase.  No  wheeze or rhonchi.  CARDIOVASCULAR:  Regular rate and rhythm without S3, normal S1 and S2.   IMPRESSION:  Chronic obstructive lung disease with asthmatic bronchitic  component.   PLAN:  Maintain nebulized therapy with Brovana b.i.d. and Spiriva daily.  We will see the patient back in follow-up.  Patient was administered a  flu vaccine.     Charlcie Cradle Delford Field, MD, [facility]  Electronically Signed    PEW/MedQ  DD: 03/30/2007  DT: 03/30/2007  Job #: [id number]

## 2010-11-05 NOTE — Assessment & Plan Note (Signed)
[facility] [facility]                             PULMONARY OFFICE NOTE   TERRE, HANNEMAN                    MRN:          [id number]  DATE:12/08/2006                            DOB:          06-17-1928    Ms. Galvis is seen today in follow-up and is a 77 year old white  female, history of chronic obstructive lung disease, asthmatic  bronchitis, primary emphysema, noting dry cough for the past week, level  of dyspnea is satisfactory.  The patient maintains on Brovana b.i.d. by  nebulization, utilizes albuterol and Atrovent by nebulization p.r.n.,  oxygen 2 L continuous, Spiriva daily.   PHYSICAL EXAMINATION:  VITAL SIGNS:  Temperature 98, blood pressure  104/68, pulse l78, saturation 93% on 2 L.  CHEST:  Distant breath sounds with no evidence of wheeze or rhonchi.  CARDIAC:  A regular rate and rhythm without S3, normal S1, S2.  ABDOMEN:  Protuberant.  Bowel sounds were active.  EXTREMITIES:  No edema or clubbing.  SKIN:  Clear.   IMPRESSION:  Chronic obstructive lung disease with primary emphysematous  asthmatic bronchitic component.   PLAN:  Maintain Brovana b.i.d. dosing, Spiriva daily, and we will see  the patient back in return follow-up in 3 months.     Charlcie Cradle Delford Field, MD, [facility]  Electronically Signed    PEW/MedQ  DD: 12/08/2006  DT: 12/09/2006  Job #: [id number]

## 2010-11-05 NOTE — Assessment & Plan Note (Signed)
[facility]                             PULMONARY OFFICE NOTE   ELLEIGH, CASSETTA                    MRN:          [id number]  DATE:11/05/2006                            DOB:          1928/04/26    Ms. Westerlund returns today in followup with history of asthmatic  bronchitis, chronic obstructive pulmonary disease at end-stage. She is  noting increased dyspnea, heart racing, depression over being dyspneic.  Symptoms are worse.   She maintains albuterol and Atrovent by nebulization q.i.d., Nexium 40  mg daily, Spiriva daily. Other maintenance medications are listed in the  chart and are correct as reviewed.   PHYSICAL EXAMINATION:  Temperature 98.0, blood pressure 110/72, pulse  83, saturation was 92% on 2 liters.  CHEST: Showed diminished breath sounds with prolonged expiratory phase.  No wheeze or rhonchi.  CARDIAC: Showed a regular rate and rhythm without S3. Normal S1, S2.  ABDOMEN: Soft, nontender.  EXTREMITIES: Showed no edema or clubbing.  SKIN: Was clear.   IMPRESSION:  This patient has chronic obstructive lung disease at end-  stage with asthmatic bronchitic flare.   PLAN:  Is to switch albuterol and ipratropium over to [facility] by  nebulization b.i.d. Samples were given and she will followup with a call  to determine whether there is improvement.     Charlcie Cradle Delford Field, MD, [facility]  Electronically Signed    PEW/MedQ  DD: 11/05/2006  DT: 11/05/2006  Job #: [id number]   cc:   Cassell Clement, M.D.

## 2010-11-05 NOTE — Consult Note (Signed)
NEW PATIENT CONSULTATION   [name], [name]  DOB:  1928-01-08                                       10/22/2007  ZOXWR#:[id number]   The patient presents today for evaluation of lower extremity pain,  edema, and weakness.  She is a 79-year white female with overall failing  health.  She is on home oxygen.  She reports that her legs give out  and that she has frequent falls.  She reports chronic swelling and  chronic discomfort in her legs as well.  She reports a long history of  peeling skin of her feet.  She does not have any specific claudication-  type symptoms.   PAST HISTORY:  Significant for hypertension, elevated cholesterol, COPD.   FAMILY HISTORY:  Negative for premature atherosclerotic disease.   SOCIAL HISTORY:  She is married.  She is retired.  She does not smoke  having quit in 1971.  She does have regular alcohol consumption with  several alcohol drinks per day.   REVIEW OF SYSTEMS:  Her weight is reported at 150 pounds, height is 4  feet 11 inches tall.  CARDIAC:  She complains of chest pain, palpitations, shortness of  breath, heart murmur.  PULMONARY:  She is on home oxygen with chronic bronchitis, wheezing, and  asthma.  GI:  Does have reflux, diarrhea, abdominal pain, and dark stools.  She does report dizziness, depression, muscular cramps, anemia.   She has multiple drugs and these are listed  in her chart.   PHYSICAL EXAM:  A Well-developed white female appearing stated age of  76, blood pressure is 159/78, pulse 72.  Her radial pulses are 2+.  She  has 2+ femoral and 2+ dorsalis pedis pulses bilaterally.  She does have  mild swelling with no evidence of venous varicosities.   She underwent noninvasive vascular laboratory studies in our office on  04/27.  This was for evaluation of possible DVT.  This did show a normal  venous studies bilaterally with no evidence of deep or superficial  thrombus or incompetence.  She did have an  incidental finding of  enlarged lymph nodes in her groins bilaterally.  I had a long discussion  with the patient and her family present.  I explained that she had  normal arterial pulses and a normal venous Doppler study.  I explained  that I cannot explain the etiology of her leg symptoms, but clearly does  not have any evidence of arterial or venous pathology to cause this.  She will continue her followup with Dr. Patty Sermons.   Larina Earthly, M.D.  Electronically Signed   TFE/MEDQ  D:  10/22/2007  T:  10/25/2007  Job:  [id number]   cc:   Cassell Clement, M.D.  Charlcie Cradle Delford Field, MD, FCCP

## 2010-11-05 NOTE — Procedures (Signed)
DUPLEX DEEP VENOUS EXAM - LOWER EXTREMITY   INDICATION:  Bilateral leg edema.   HISTORY:  Edema:  Bilaterally.  Trauma/Surgery:  No.  Pain:  Cramps in both legs.  PE:  No.  Previous DVT:  No.  Anticoagulants:  No.  Other:  DUPLEX EXAM:                CFV   SFV   PopV  PTV    GSV                R  L  R  L  R  L  R   L  R  L  Thrombosis    o  o  o  o  o  o  o   o  o  o  Spontaneous   +  +  +  +  +  +  +   +  +  +  Phasic        +  +  +  +  +  +  +   +  +  +  Augmentation  +  +  +  +  +  +  +   +  +  +  Compressible  +  +  +  +  +  +  +   +  +  +  Competent   Legend:  + - yes  o - no  p - partial  D - decreased   IMPRESSION:  1. No evidence of deep venous thrombosis in the bilateral lower      extremities.  2. Incidental finding of enlarged lymph nodes noted in the bilateral      groin region.    _____________________________  Janetta Hora Fields, MD   CH/MEDQ  D:  10/18/2007  T:  10/18/2007  Job:  [id number]

## 2010-11-08 ENCOUNTER — Telehealth: Payer: Self-pay | Admitting: Critical Care Medicine

## 2010-11-08 NOTE — H&P (Signed)
[place]. [facility]  Patient:    GLENDIA, OLSHEFSKI Visit Number: [id number] MRN: [id number]          Service Type: MED Location: [phone] Attending Physician:  Mervin Hack Dictated by:   Dianah Field, P.A. Admit Date:  03/05/2001                           History and Physical  CONTINUATION  DATE OF BIRTH:  [birth year]  LABORATORY DATA:  PT 13.5, INR 1.5, and PTT 56.  Abdominal x-ray is showing focal ileus in the left lower quadrant.  Urinalysis has been performed, but results are not known.  Chest x-ray shows left basilar atelectasis.  ASSESSMENT: 1. Focal ileus versus partial small-bowel obstruction.  In any event, the    patient has been unable to take any of her oral medications for the last    couple of days, and she is going to require admission for medical    management of this. 2. Type 1 von Willebrand disorder.  Note that her PTT is increased, but    she has not had any bleeding problems associated with this for a long    time. 3. History of hypertension. 4. Coronary artery disease listed as a medical problem, but there are no    details known. 5. Chronic obstructive pulmonary disease/asthma which is stable. 6. ______ anxiety. 7. Status post multiple abdominal and pelvic surgeries. 8. History of cerebrovascular accident.  PLAN:  The patient is being admitted to a non-telemetry bed.  She will have placement of an NG tube for decompression and enemas from below to attempt to resolve the ileus and move her bowels.  Once she has had the NG tube in place for awhile, we will attempt to clamp it and give her oral medications as tolerated.  A lot of her medications will be held, and detailed orders should be reviewed.  Will plan to repeat her KUB in the morning. Dictated by:   Dianah Field, P.A. Attending Physician:  Mervin Hack DD:  03/05/01 TD:  03/05/01 Job: [id number]

## 2010-11-08 NOTE — Discharge Summary (Signed)
[place]. [facility]  Patient:    [name], [name] Visit Number: [id number] MRN: [id number]          Service Type: MED Location: [phone] Attending Physician:  Mervin Hack Dictated by:   Dianah Field, P.A. Admit Date:  03/05/2001 Disc. Date: 03/10/01   CC:         Hedwig Morton. Juanda Chance, M.D. [facility]  Charlcie Cradle. Delford Field, M.D. [facility]  Maisie Fus A. Patty Sermons, M.D.   Discharge Summary  DATE OF BIRTH:  [birth year]  ADMITTING DIAGNOSES:  1. Abdominal pain with constipation, nausea, and vomiting.  Rule out     secondary to partial small bowel obstruction versus secondary to focal     ileus.  2. Type I Von Willenbrand disorder.  Patient has an increased PTT.  No recent     bleeding problems.  3. History of hypertension, treated.  4. Coronary artery disease, exact status of her coronary arteries is not     known.  5. Asthma with chronic obstructive pulmonary disease, stable, but has a lot     of shortness of breath with exertion.  6. Anxiety.  7. Diverticulosis.  Has had hospitalizations in 1995 and 1998 at [facility] secondary to diverticulitis.  8. History of cerebrovascular accidents.  9. Status post carotid endarterectomy. 10. History of peptic ulcer disease with minor associated bleeding. 11. History of colon polyps. 12. Headaches.  PAST SURGICAL HISTORY: 1. Status post multiple left and right breast biopsies, apparently benign. 2. Cholecystectomy in 1971. 3. Starch peritonitis following gallbladder surgery. 4. Status post resection of vocal cord polyps. 5. Carotid endarterectomy December 1994.  DISCHARGE DIAGNOSES: 1. Partial small bowel obstruction versus focal ileus, symptoms resolved.    Question secondary to surgical adhesions versus secondary to stricturing    associated with extensive diverticular changes noted on previous    colonoscopy. 2. Hypoxia.  Patient is to be started on home oxygen at discharge.  No  evidence for acute lung disease.  VQ scan performed had low suspicion for    pulmonary embolus. 3. Type I Von Willenbrand disorder, inactive during this hospitalization. 3. Status post multiple abdominal and pelvic surgeries.  BRIEF HISTORY:  [name] is a 77 year old white female with above noted extensive past medical and surgical history.  She has had diverticulitis and colon polyps as well as some sort of inflammatory process in the colon.  Her latest colonoscopy had been performed March 1998.  There was marked diverticulosis in the sigmoid and several tiny polyps in the rectosigmoid which were biopsied and were hyperplastic.  The region in the sigmoid involved with diverticulosis showed severe narrowing and rigidity with narrowing of the lumen to less than 50% of normal.  She also had a mesenteric angiogram in April 1998 because of abdominal pain showing some atheroma in the celiac and SMA, but patent mesenteric vessels.  A barium enema was also performed in March 1998 that showed diverticulosis of the sigmoid colon, but no diverticulitis and the distal small bowel was refluxed and the terminal 10-20 cm of ileum appeared to be normal.  Patient periodically has bouts of abdominal bloating and pain which occur with constipation.  The symptoms resolve once her bowels get moving and she does say that she starts to feel uncomfortable if she has not had at least two or three of her typical loose bowel movements every day.  About four days prior to this admission she had  a stool that was a bit more formed than usual.  The following day she developed diffuse abdominal pain with nausea and vomiting. These symptoms persisted.  She did not use any laxatives or analgesics. Ultimately, she was unable to keep any of her medications down because of the vomiting and pain persisted.  She presented on Friday, September 13 for evaluation to the emergency room.  At that time she was diffusely  tender.  Her vital signs were stable.  Laboratories were unremarkable including a urinalysis.  She was fecal occult blood negative.  Her acute abdominal series did show mildly dilated loop of small bowel in the left lower abdomen consistent with a focal ileus.  She was admitted by Dr. Lina Sar for supportive care including IV fluid hydration, NG suction, analgesics, antiemetic.  LABORATORIES:  Sodium 139, potassium 3.8, chloride 102, CO2 28, glucose 108, BUN 19, creatinine 0.9.  Total bilirubin 1.3, alkaline phosphatase 68, AST 26, ALT 22.  Amylase 36, lipase 16, albumin 3.7, calcium 9.1.  PT 13.5, INR 1.0, PTT 56.  White blood cell count initially 10.8, corrected to 7.5, hemoglobin ranging 13.9 down to 11.9, hematocrit 40.7 down to 34.6, MCV 90.3, platelets ranging 283,000 down to 126,000.  Initial differential showed a left shift with increase in neutrophils.  Second differential was essentially normal except for some slight increase of basophils.  Arterial blood gases on room air:  pH 7.438, PCO2 37.8, PO2 62.3, bicarbonate 25.1.  X-rays:  Initial acute abdominal series showed some left basilar atelectasis on the chest views and the mildly dilated loop of small bowel in the left abdomen consistent with a focal ileus.  Followup single view abdomen on September 14 showed unremarkable bowel gas pattern.  Two-view chest on September 17 showed COPD, but no active disease.  CONSULTATIONS:  Dr. Marcelyn Bruins for pulmonary evaluation.  HOSPITAL COURSE:  Patient was admitted from the ER to a nontelemetry bed.  NG tube was placed to low intermittent suction.  During the first 24 hours of her hospitalization she put out about 600 cc.  She was given IV medications where possible and otherwise some of her non-essential medications were held and others were given when the NG tube was clamped for an hour at a time.  She put  out less than 1 L of NG output for two and a half to three days before  it was discontinued.  Her followup abdominal films had shown x-ray resolution of the ileus/obstructive pattern.  She continued to have abdominal pain, but no nausea.  Pain eventually subsided to a dull roar.  The NG tube was clamped and she tolerated this along with a diet of clear liquids for 24 hours.  The NG tube was then pulled and diet was advanced to a low residue diet.  She tolerated two or three meals of a low residue diet with initially a bit of increased abdominal pain, but no nausea.  By the day of discharge she had had a bowel movement and was passing plenty of flatus.  Patient complained of hypoxia and indeed her oxygen saturations after minimal exercise went down to 84 and 86%.  The pulmonary consult was called.  She had blood gases drawn and two-view chest films.  Dr. Shelle Iron saw her for her regular doctor, Dr. Shan Levans.  Dr. Shelle Iron ordered a VQ scan in order to rule out pulmonary embolus.  The VQ scan showed low probability for PE.  The patients oxygen saturations improved nicely on 2  L of nasal cannula oxygen. Dr. Shelle Iron felt that she should go home on oxygen and social work was called in to arrange for home oxygen.  Patient does suffer from frequent headaches and she did say that with the oxygen the headaches were much reduced in intensity and frequency.  Patient was discharged to home in stable and much improved condition on September 18.  She had followup office visits arranged with Dr. Juanda Chance for October 18 and with Dr. Delford Field for October 8.  Home oxygen was to be delivered to her home.  She was to call Dr. Delia Chimes office if she had any further unresolving nausea, abdominal pain, or constipation.  If she were to develop minor GI symptoms she was advised to try liquid diet and Dulcolax suppositories to see if she could get things moving.  Otherwise, she was to call the office.  DISCHARGE MEDICATIONS:  1. Rhinocort nasal spray two squirts to each nostril q.d.   2. Guiafenex 1200 mg one p.o. b.i.d.  3. KCL 10 mEq two p.o. q.d.  4. Albuterol metered dose inhaler two to four puffs p.r.n.  5. Toprol XL 5 mg p.o. q.d.  6. Zocor 20 mg p.o. q.d.  7. Valium 5 mg one-half p.o. b.i.d.  8. Nitro-Dur patch 0.4 mg p.o. q.d.  9. Paxil 20 mg one-half p.o. q.d. 10. Prilosec 20 mg p.o. q.d. 11. Centrum Silver one p.o. q.d. 12. Vitamin E dose unknown one p.o. q.d. 13. Vitamin C dose unknown one p.o. q.d. 14. Calcium 500 mg one p.o. b.i.d. 15. Combivent inhaler four puffs q.i.d. 16. Dulcolax suppositories one to two p.r. p.r.n. Dictated by:   Dianah Field, P.A. Attending Physician:  Mervin Hack DD:  03/10/01 TD:  03/10/01 Job: [id number]

## 2010-11-08 NOTE — Discharge Summary (Signed)
[facility]  Patient:    [name], [name]                    MRN: [id number] Adm. Date:  [id number] Disc. Date: 05/21/00 Attending:  Caleb Popp Dictator:   Earley Favor, RN, MSN, ACNP CC:         Clovis Pu. Patty Sermons, M.D.   Discharge Summary  DATE OF BIRTH:  Jun 12, 1928  DISCHARGE DIAGNOSES: 1. Acute exacerbation of chronic obstructive pulmonary disease. 2. History of coronary artery disease. 3. Increased cholesterol. 4. Von Willebrands disease, type 1.  PROCEDURES:  None.  HISTORY OF PRESENT ILLNESS:  [name] is a 77 year old white female who carries a diagnosis of chronic obstructive pulmonary disease and is treated with multiple medications.  She presented to the emergency department at [facility] on weekend of May 17, 2000, with a chief complaint of shortness of breath and wheezing with yellow purulent sputum.  She was treated with Zithromax without improvement.  She presented to Dr. Florene Route office on May 19, 2000, with a continuation of symptoms of purulent sputum, shortness of breath and wheezing.  She had proven to be refractory to outpatient treatment.  She was admitted to [facility] for further evaluation and treatment.  LABORATORY DATA:  WBC is 7.9, RBC is 402.  Hemoglobin 12.1, hematocrit is 34.9, platelet count is 253.  Arterial blood gases on room air, pH 7.41, pCO2 of 36, pO2 of 61.  Sodium is 141, potassium is 3.7, chloride is 106, CO2 is 24, BUN is 12, creatinine 1.0, glucose is 91.  Total protein is 7.0.  RADIOGRAPHIC DATA:  Chest x-ray demonstrates stable chest.  No active pul pulmonary disease or process demonstrated.  There are postsurgical changes noted in the neck.  A 12-lead ECG shows normal sinus rhythm, premature ventricular contractions, otherwise normal.  HOSPITAL COURSE: #1 - ACUTE EXACERBATION OF CHRONIC OBSTRUCTIVE PULMONARY DISEASE:  [name] was admitted to  [facility], treated with IV Rocephin, IV Solu-Medrol, along with nebulized bronchodilators.  Her status improved quickly with the institution of IV pharmaceutical intervention.  She reached maximal hospital benefit on May 21, 2000, and was discharged home in improved condition.  #2 - CORONARY ARTERY DISEASE:  Coronary artery disease remained stable and she was maintained on her current medications.  #3 - INCREASED CHOLESTEROL:  Of note, she was placed on Zocor, but this had been stopped by Dr. Patty Sermons.  Zocor was discontinued.  She will be followed by Dr. Patty Sermons, who will monitor her cholesterol and institute further medication intervention if needed.  #4 - VON WILLEBRANDS DISEASE:  Von Willebrands disease remained stable.  She remains on her proton pump inhibitors.  MEDICATIONS:  Of note, she was taking multiple regimens of pulmonary medications and intermixing them with three different medications which consisting of different forms of albuterol.  Of note, she was extremely nervous during this process.  She was given a medication list and precise instructions how to utilize her medications.  They are:  1. Combivent four puffs four times a day.  2. Duratuss-G 1200 mg b.i.d.  3. K-Dur 10 mEq b.i.d.  4. Toprol XL 50 mg one q.d.  5. Premarin 0.625 mg one q.d.  6. Nor-Q.D. 0.356 mg q.d.  7. Prilosec 20 mg one q.d.  8. Diazepam 5 mg one-half tablet b.i.d.  9. Nitro-Dur patch 0.4 mg on at 2 p.m. off at 10 a.m. 10. Vitamin E, calcium, ginseng, Centrum  Silver, vitamin C one per day. 11. Ceftin 250 mg one b.i.d. for the next eight days. 12. Prednisone 10 mg on a taper, 40 mg for four days, 30 mg for four days,     20 mg for four days, 10 mg for four days and then stop.  Her p.r.n. medications consist of:  1. For wheezing she will have albuterol puffer two puffs two times a day.  2. For increased wheezing and increased shortness of breath she will have     albuterol  nebulizer one treatment two times a day.  She was instructed     not to utilize both.  3. For chest pain she can have Nitrostat 1/150 sublingual per instructions.  4. For cough she can have Tussionex one teaspoon b.i.d. p.r.n.  FOLLOWUP:  She has a followup with nurse practitioner, Dr. Judy Pimple on May 28, 2000, at 2:15 p.m.  DIET:  Low salt diet.  SPECIAL INSTRUCTIONS:  She is to call for any problems.  DISPOSITION/CONDITION ON DISCHARGE:  Acute exacerbation of chronic obstructive pulmonary disease has improved. DD:  05/21/00 TD:  05/21/00 Job: [id number]

## 2010-11-08 NOTE — Assessment & Plan Note (Signed)
[facility]                               PULMONARY OFFICE NOTE   LAWSYN, HEILER                    MRN:          [id number]  DATE:03/24/2006                            DOB:          12/17/1927    Ms. [name] is a 77 year old white female with a chronic obstructive lung  disease advanced features, having increased difficulty with dyspnea, some  degree of dizziness, some dyspnea with cough and mucus production, increased  postnasal drainage.  No real chest discomfort.  Maintains DuoNeb q.i.d.,  Nexium 40 mg daily, nitro patch 0.4 mg daily, Zetia 10 mg daily, Toprol XL  50 mg daily, isosorbide 30 mg daily, Paxil 10 mg daily, alprazolam 0.5 mg  t.i.d., Spiriva daily, Micardis 40 mg daily, Simvastatin 40 mg daily,  potassium 10 mEq daily, Flonase 2 sprays each nostril daily.   On exam, temperature was 98, blood pressure 124/66, pulse 82, saturation 91%  on two liters.  Chest showed diminished breath sounds with prolonged  expiratory phase.  No wheeze or rhonchi were noted.  Cardiac exam showed a  regular rate and rhythm without S3.  Normal S1-S2.  Abdomen was soft,  nontender.  Extremities showed no edema or clubbing.  Skin was clear.   Impression is that of chronic obstructive lung disease, asthmatic-bronchitic  flare.   Plan is for the patient to receive prednisone at 40 mg a day with a slow  taper, down by 10 mg or three days off.  No systemic antibiotics are  prescribed.  She will receive a flu vaccine today in the office and we will  see the patient back in return follow up in two months.       Charlcie Cradle Delford Field, MD, [facility]      PEW/MedQ  DD:  03/24/2006  DT:  03/25/2006  Job #:  [id number]

## 2010-11-08 NOTE — Discharge Summary (Signed)
NAMEARLYN, BUMPUS             ACCOUNT NO.:  [id number]   MEDICAL RECORD NO.:  [id number]          PATIENT TYPE:  INP   LOCATION:  0451                         FACILITY:  [facility]   PHYSICIAN:  Anselm Pancoast. Weatherly, M.D.DATE OF BIRTH:  March 20, 1928   DATE OF ADMISSION:  04/14/2004  DATE OF DISCHARGE:  04/21/2004                                 DISCHARGE SUMMARY   DISCHARGE DIAGNOSES:  1.  Severe constipation.  2.  History of significant chronic obstructive pulmonary disease.  3.  Gastroesophageal reflux disease.   HISTORY:  [name] is a 77 year old female patient who is followed by  Dr. Lina Sar for abdominal discomfort, diverticulosis, followed by Dr.  Delford Field for COPD and breathing problems and stated that she started having  cramping, bloating, abdominal pain approximately 3 or 4 days earlier and had  talked with the offices of various physicians and everybody thought that  this was someone else's problem and no suggestion and then with basically no  improvement presented to the emergency room on the 15th of October. She has  a past history of asthma, hypertension, previous CVA and other problems  including diverticulosis and was being evaluated for the abdominal  complaints.  X-rays were obtained which showed a marked distended abdomen  with some dilated small bowel loops and the question was raised whether this  was a bowel obstruction. The patient said that she had gotten light headed  and sort of had fallen in one of these episodes and they also did a CT of  the head without contrast that showed no evidence of any acute intracranial  abnormality but moderate cerebral atrophy and small vessel disease.  The  emergency room physicians thought that this was probably a small bowel  obstruction since that is what the plain x-rays had suggested and requested  surgical management and I was on call and was asked to see the patient.   PAST MEDICAL HISTORY:  She had previous  problems with diverticulosis and had  adjustment in her chronic laxatives or intestinal medications by Dr. Juanda Chance  recently and was given something to prevent diarrhea and it was after this  that she appeared to be having more basically bloated and nonbowel function.   LABORATORY DATA:  Her hematocrit was 36, white count was 14,200 and a  nasogastric tube was placed with large NG drainage and a fluid bolus was  given.  I obtained a CAT scan of the abdomen without contrast and we saw no  evidence of any acute inflammatory process. There was diverticulosis of the  sigmoid colon but it appeared that the distal small bowel obstruction you  could not actually see a point but there was a large amount of stool within  her complete colon.  No evidence of any acute diverticulitis and she was  admitted, placed on intravenous fluids and nasogastric tube.   The following day I asked that both Dr. Delford Field and the GI with [facility] see  her for her chronic medications and etc. and they did.  She had a mildly low  potassium of 2.8 that was corrected with intravenous  fluids.  Her BUN on  admission was 34 and her creatinine 1.8. I felt this was predominantly  hypervolemic and this was corrected.  Then enemas were used to basically  clean out the colon and with the colon evacuation she started passing gas  and flatus and repeat x-rays showed things were improving, her white count  had returned to normal and her BUN and creatinine also returned to normal.  She had had nasogastric tube removed approximately the third hospital day  and then the following day I started her on liquids which she tolerated with  bowel function.  Over the next 24 hours, her diet was advanced and she was  ready for discharge in improved condition on Sunday.  Her instructions to  call Dr. Lynelle Doctor office and have him review her medication list which was  extensive and see if all of the medications were definitely needed or  whether  some could be discontinued.  I also advised her not to take any  medications for diarrhea as I expected the medication that had been given  for the loose bowel movements was probably what basically got her  constipated which was really the main cause of this pseudoobstruction type  on x-rays.  Dr. Orson Slick actually saw her on Saturday and Sunday and  discharged her with instructions to followup with her regular physicians and  to call our office if needed. She was discharged on her usual medications as  far as the breathing treatments, Xanax twice a day, Paxil daily,  nitroglycerin patch, Toprol XL, MiraLax 17 g daily, Protonix 40 mg daily,  Tylenol as needed for pain.  Hopefully if constipation can be __________  these episodes of questionable small bowel obstructions can be prevented as  she cleared with correction of the constipation and probably never had a  real small bowel obstruction.      WJW/MEDQ  D:  05/07/2004  T:  05/08/2004  Job:  [id number]   cc:   Shan Levans, M.D. [facility]

## 2010-11-08 NOTE — Assessment & Plan Note (Signed)
[facility]                               PULMONARY OFFICE NOTE   [name], [name]                    MRN:          [id number]  DATE:04/23/2006                            DOB:          06/10/1928    [name] is a 77 year old white female with a history of chronic  obstructive lung disease. Overall, level of dyspnea is the same, but  certainly improved compared to earlier in the month.  Patient maintains  albuterol and Atrovent by nebulization q.i.d., Spiriva daily. Other  maintenance medicines listed in the chart are corrected as reviewed.  Maintains oxygen at 2 liters.   Temperature 98, blood pressure 116/72, pulse 73, saturation 91% on 2 liters.  CHEST: Diminished breath sounds with prolonged expiratory phase. No wheeze  or rhonchi noted.  CARDIAC: Regular rate and rhythm, without S3. Normal S1 and S2.  ABDOMEN: Soft, nontender.  EXTREMITIES: No edema or clubbing.  SKIN: Clear.  NEUROLOGIC: Intact.  HEENT: No jugular vein distention, no lymphadenopathy. Oropharynx clear.  NECK: Supple.   IMPRESSION:  Chronic obstructive lung disease with primary emphysematous  component.   PLAN:  The patient is to maintain inhaled medicines as currently dosed. We  will see the patient back in return followup in 2 months.     Charlcie Cradle Delford Field, MD, [facility]  Electronically Signed    PEW/MedQ  DD: 04/23/2006  DT: 04/24/2006  Job #: [id number]   cc:   Cassell Clement, M.D.

## 2010-11-08 NOTE — H&P (Signed)
[name], [name]             ACCOUNT NO.:  [id number]   MEDICAL RECORD NO.:  [id number]          PATIENT TYPE:  EMS   LOCATION:  ED                           FACILITY:  [facility]   PHYSICIAN:  Anselm Pancoast. Weatherly, M.D.DATE OF BIRTH:  1927-10-23   DATE OF ADMISSION:  04/14/2004  DATE OF DISCHARGE:                                HISTORY & PHYSICAL   CHIEF COMPLAINT:  Nausea and vomiting for 4 days.   HISTORY:  [name] is a 77 year old patient of Dr. Lina Sar, Dr.  Cassell Clement, and Dr. Delford Field, for GI complaints, COPD, and cardiac.  The  patient states that last Wednesday, 4 days ago, was her last bowel movement.  She became bloated and nauseous.  I think she has talked with all the  various doctors who ensured her that she was be okay and they did not need  to see her.  This has persisted, and because of bloating and further  vomiting, she was brought by ambulance tonight, and was seen by the  emergency room physician.  He did an acute abdomen series that shows dilated  loops of small bowel, and asked that a surgical consultation be obtained.  The patient has a history of hypertension, angina for which she has taken  nitroglycerin patches, and on examination when she was admitted, the rescue  squad listed her blood pressure as 170 over something.  The ER found her  sort of normotensive with a pulse of about 80.  She is very bloated.  Kind  of vague mild abdominal tenderness.  Has a very large old right paramedian  incision, and she has Dr. Theador Hawthorne operated on her, and the re-operated  on her after a cholecystectomy in which she had problems with an allergic  reaction to starch.  Since then, she has had several admissions for  abdominal distention, NG suction, and she usually was managed by Dr. Mosetta Anis, and, of course, he has not been practicing for about 4 years.  I  think she has actually been in the hospital since then, but I am not sure  who actually had  her in the hospital.  I gave her some oral contrast, and  then obtained a CT.  The CT does not show any ischemic-appearing loops of  small bowel.  She does have a very distended stomach and small bowel, with a  transition probably about 6 inches from the terminal ileum, and then the  colon is decompressed.  There is a small amount of solid stool within the  colon, but no actual obstruction of the colon.  The colon does have  significant diverticulosis in the sigmoid colon, but there is nothing that  looks like acute diverticulitis.  The radiologist who read the film feels  that this is a high-grade partial small bowel obstruction.  No evidence of  any questionable ischemia, and I think that we will admit her, place an NG  tube, which was done, and had 2600 cc of NG drainage removed.  Looking in  the old charges or hospital admissions, it appears that she was  admitted in  September of 2002 for a similar hospitalization that controlled after  several days of NG suctions, and it appears that she was admitted to Dr.  Verlee Monte Brodie's service at that time.  She also had exacerbation of COPD, and  she was admitted in 2003 by Dr. Patty Sermons, but does not list any abdominal  problems at that time.  The patient's husband had left with the actual  bottles of the medications, which multiple and listed, but no dosage.   MEDICATIONS:  1.  Cartia.  2.  Mecardia.(?)  3.  Prednisone; I think she is on 10 a day.  4.  Albuterol.  5.  Protonix.  6.  Remac ort.  7.  Quinine sulfate.  8.  Zocor.  9.  Vicor.  10. Toprol-XL.  11. Nitroglycerin patch.  12. MiraLax.  13. __________ XR.  14. Xanax.   PREVIOUS SURGERY:  As far as her abdominal surgery, that was back 25 years  ago by Dr. Theador Hawthorne, and reoperation shortly afterwards for partial  obstruction after the initial incision.   PHYSICAL EXAMINATION:  VITAL SIGNS:  I find the ones from the ambulance:  Pulse 80, blood pressure 135/82, respirations  14.  HEENT:  Eyes, ears, nose and throat are essentially normal.  She had kind of  dry mucous membranes, significantly dehydrated.  LUNGS:  Good breath sounds bilaterally.  No acute wheezing.  CARDIAC:  Regular rhythm.  ABDOMEN:  Very distended, kind of vaguely tender throughout the abdomen, not  localized in any particular quadrant.  A small amount of stool in the  rectum.  Not much gas in the rectum.  EXTREMITIES:  She is nonedematous.   After the NG tube was placed, she felt significantly improved.   PLAN:  The patient will be admitted for NG suction.  Will have Dr. Verlee Monte  Brodie's P.A. see her in the morning for trying to check what medicines she  is on with Dr. Patty Sermons.  Hopefully, this will response as her previous  admissions have done, not requiring a laparotomy.  The CT does not show any  evidence of ischemic or closed loop obstruction.  I think at this time that  we can manage her non-surgically.  Her BUN was about 35, given a fluid  bolus, and run her IVs at 125 cc an hour.  Now D-5 half normal with 20 KCl  per liter.  She will have a repeat CBC and CMET in the morning, and will  just add what medications she needs and how to give them, since she is NPO  in the a.m.   ADMISSION IMPRESSION:  1.  High-grade partial small bowel obstruction is a recurrent problem.  2.  Chronic obstructive pulmonary disease.  3.  History of hypertension and angina.  4.  Chronic GI complaints.  I am not sure whether it is diverticulosis or      exactly what.      WJW/MEDQ  D:  04/14/2004  T:  04/15/2004  Job:  [id number]

## 2010-11-08 NOTE — Assessment & Plan Note (Signed)
 HEALTHCARE                               PULMONARY OFFICE NOTE   Joann, [name]                    MRN:          [id number]  DATE:02/04/2006                            DOB:          09/27/1927    [name] returns today in followup.  A 77 year old white female with a  history of advanced chronic obstructive lung disease, primary emphysematous  component. Medication profile was unchanged from the last visit and is  correct as reviewed.  Level of dyspnea is unchanged.  There are no new  symptoms.   EXAMINATION:  VITAL SIGNS:  Temperature 97.  Blood pressure 120/76.  Pulse  72.  Saturation 95% on 2 liters.  CHEST:  Diminished breath sounds with long expiratory phase.  No wheeze or  rhonchi noted.  CARDIAC:  Showed a regular rate and rhythm without S3.  Normal S1 and S2.  ABDOMEN:  Protuberant.  EXTREMITIES: Showed no edema or clubbing.   IMPRESSION:  Chronic obstructive lung disease with primary emphysematous  component - stable.   PLAN:  1. No change in medication profile.  2. Renewed Xanax.                                   Charlcie Cradle Delford Field, MD, FCCP   PEW/MedQ  DD:  02/04/2006  DT:  02/04/2006  Job #:  [id number]   cc:   Cassell Clement, MD

## 2010-11-08 NOTE — Assessment & Plan Note (Signed)
[facility]                             PULMONARY OFFICE NOTE   DRINA, JOBST                    MRN:          [id number]  DATE:08/28/2006                            DOB:          September 01, 1927    Ms. Fiebelkorn is a 77 year old white female with end-stage chronic  obstructive lung disease, asthmatic bronchitic component, emphysematous  component.  She is maintaining oxygen 2L continuous, Spiriva daily,  Nexium daily, albuterol and Atrovent by nebulization q.i.d.  Other  maintenance medicines listed on the chart are correct as reviewed.   EXAM:  Temp 97, blood pressure 110/70, pulse 74, saturation 95% on 2L.  CHEST:  Distant breath sounds, prolonged expiratory phase, no wheezing  or rhonchi noted.  CARDIAC:  Regular rate and rhythm without S3, normal S1, S2.  ABDOMEN:  Soft, nontender.  EXTREMITIES:  No edema or clubbing.  Skin was clear.   IMPRESSION:  Chronic obstructive lung disease with a primary  emphysematous component.   PLAN:  Maintain inhaled medicines as currently dosed and will see the  patient back in 6 weeks.     Charlcie Cradle Delford Field, MD, [facility]  Electronically Signed    PEW/MedQ  DD: 08/28/2006  DT: 08/28/2006  Job #: [id number]   cc:   Cassell Clement, M.D.

## 2010-11-08 NOTE — H&P (Signed)
[place]  Patient:    [name], [name] Visit Number: [id number] MRN: [id number]          Service Type: MED Location: [phone] Attending Physician:  Mervin Hack Dictated by:   Dianah Field, P.A. Admit Date:  03/05/2001                           History and Physical  INCOMPLETE  DATE OF BIRTH:  Aug 30, 1927  CHIEF COMPLAINT:  Bloating and abdominal pain with nausea, vomiting, and last BM three days ago.  HISTORY OF PRESENT ILLNESS:  [name] is a 77 year old white female with extensive medical and surgical history.  She has a type 1 von Willebrands disorder and known diverticulosis.  She has a history of colon polyps and some sort of inflammatory process in the colon.  Her medical history lists coronary artery disease, but the exact status of those arteries is not known.  She has atherosclerotic peripheral vascular disease and has had a right carotid endarterectomy and strokes in the past.  The patient periodically has bouts of abdominal bloating and pain and these occur when she becomes constipated.  Symptoms are relieved when her bowels move.  Symptoms may or may be associated with nausea or vomiting.  Normally she does not have these symptoms so long as she has quite soft and runny stools about three or four daily.  She has occurrences of these obstructive type symptoms about 1-2 times a month.  For the most part they resolve themselves.  The patients last bowel movement was Tuesday and it was a bit more formed than usual.  On Wednesday evening she developed diffuse abdominal pain with distention along with nausea and vomiting.  The nausea, vomiting, abdominal pain has continued through today.  She has not used any laxative or analgesics to relieve the symptoms.  She has been unable to keep her medications down for at least the last couple of days.  She denies any bleeding per rectum, though she does have a  stable amount of occasional hemorrhoidal bleeding, but this has not been increased of late.  She has not had any blood or coffee ground emesis.  PAST MEDICAL HISTORY:  Medical problems include bronchitis/COPD, asthma, heart murmur.  She has a history of angina.  History of high blood pressure.  She has type 1 von Willebrands disorder.  She has a history of diverticulosis. She had a stroke associated with her carotid endarterectomy in 1994 and one previous to that.  She has had minor bleeding associated with ulcers more than 20 years ago.  History of diverticulitis, hospitalized twice in 1995 and once in 1998 at [facility].  PAST SURGICAL HISTORY:  Surgical interventions include status post multiple left and right breast biopsies.  Cholecystectomy in 1971.  Starch peritonitis in 1971 following gallbladder surgery.  Status post resection of vocal cord polyps.  Carotid endarterectomy December of 1994.  Colonoscopies in 1996 and 1998.  Details not available, but apparently there have been colon polyps. The patient has had CT scan of the head in December of 2000.  PHYSICIANS:  These include Dr. Cassell Clement, Dr. Mosetta Anis, Dr. Lina Sar, Dr. Chriss Driver, Dr. Maximino Greenland, Dr. Glenford Peers, Dr. Nelle Don. Madilyn Fireman, and Dr. Shan Levans.  SOCIAL HISTORY:  The patient lives with her husband.  She admits to two drinks of bourbon a day which may be anywhere  from 2-4 ounces of bourbon total daily. She quit smoking cigarettes about five years ago.  FAMILY HISTORY:  Positive for CHF, MI, ruptured abdominal aortic aneurysm, ruptured cerebral aneurysm.  MEDICATIONS:  1. Rhinocort two squirts to each nostril daily.  2. Guaifenex 1200 mg p.o. b.i.d.  3. Kay Ciel 10 mEq two p.o. q.d.  4. Albuterol metered dose inhaler 2-4 puffs p.r.n.  Has not required this     recently.  5. Toprol XL 5 mg p.o. q.d.  6. Zocor 20 mg p.o. q.d.  7. Valium 5 mg one half p.o. b.i.d.  8.  Nitro-Dur patch 0.4 mg q.d.  9. Paxil 20 mg one half p.o. q.d. 10. Prilosec 20 mg p.o. q.d. 11. Centrum Silver one p.o. q.d. 12. Vitamin E one p.o. q.d. 13. Vitamin C one p.o. q.d. 14. Calcium 500 mg one p.o. q.d. 15. Combivent inhaler four puffs q.i.d.  REVIEW OF SYSTEMS:  She is complaining of some stable visual blurriness on the left side, where she has a cataract.  She has frequent headaches, which are stable.  She was admitted to [facility] earlier in the summer of 2002 because of a COPD/asthma flare.  Exercise tolerance is quite limited because of her asthma, though she is able to vacuum using a self-propelled vacuum.  She has chronic nasal congestion and rhinorrhea.  She has urge bladder incontinence.  She denies chest pain, extremity edema.  She does have a productive cough of gray and sometimes clear sputum.  She bruises easily, but has had no oral or nasal bleeding.  She has been dieting and has managed to lose five pounds in the last 8-10 months.  ALLERGIES:  SULFA DRUGS.  She specifies SEPTRA.  PHYSICAL EXAMINATION:  GENERAL:  The patient is a frail, elderly white female who has exophthalmos. She is somewhat pale.  She is comfortable.  VITAL SIGNS:  Blood pressure 171/76, pulse is 80, respirations are 20, temperature 97.3.  HEENT:  There is no scleral icterus.  Extraocular movements are intact and there is some conjunctival pallor.  Oropharynx:  The mucous membranes are dry, but there are no lesions.  NECK:  There are soft carotid bruits bilaterally and a carotid endarterectomy scar on the right.  CARDIAC:  She has a regular rate and rhythm with a soft systolic murmur.  CHEST:  Clear to auscultation bilaterally.  ABDOMEN:  Distended but soft.  It is diffusely tender, though the patients complaint of pain is much more significant than the actual exam.  She has no guarding or rebound.  There is an extensive firmness in the right abdomen which feels like  an air-filled loop of bowel or a collection of stool.  Bowel  sounds are active and slightly tingling.  There is no hepatosplenomegaly.  RECTAL:  She has external hemorrhoids.  There is a small amount of formed stool in the vault which is fecal occult blood negative.  Exam is nontender and nonbloody.  EXTREMITIES:  Dorsalis pedal pulses are 3+ bilaterally.  There is no lower extremity edema.  NEUROLOGIC:  Her grip strength is 5/5 bilaterally.  She is alert and oriented x 3 with good memory.  LABORATORY:  Amylase 36, lipase 16.  Sodium 139, potassium 3.8, BUN 19, creatinine 0.9, and glucose 108.  Total bilirubin 1.3, alkaline phosphatase 68, AST 26, ALT 22.  Albumin 3.7, calcium is 9.1.  Coags _____ Dictated by:   Dianah Field, P.A. Attending Physician:  Mervin Hack DD:  03/05/01 TD:  03/05/01 Job: [id number]

## 2010-11-08 NOTE — H&P (Signed)
[facility]  Patient:    [name], [name]                    MRN: [id number] Adm. Date:  [id number] Disc. Date: [id number] Attending:  Armanda Heritage Dictator:   Earley Favor, RN, MSN, ACNP CC:         [name]. Patty Sermons, M.D.  Milus Mallick, M.D.   History and Physical  CHIEF COMPLAINT:  Shortness of breath.  HISTORY OF PRESENT ILLNESS:  [name] is a 77 year old white female who has a diagnosis of chronic obstructive pulmonary disease and is treated with nebulized bronchodilators, i.e., albuterol.  She presented to the Emergency Department at [facility] on the weekend of May 17, 2000, with chief complaints of shortness of breath, wheezing, and yellow purulent sputum. She was treated with Zithromax Z-pack without improvements in her symptoms.  She presents to Dr. Florene Route office today with continued complaints of shortness of breath, purulent sputum, and there has been refractory outpatient treatment.  Due to her severe chronic obstructive pulmonary disease and her known history of coronary artery disease, she will be admitted to the hospital for further evaluation and treatment with IV antibiotics, IV steroids, nebulized bronchiolitis.  PAST MEDICAL HISTORY:  Bronchitis, asthma, heart murmur, angina, hypertension, type 1 _________ disease, diverticulosis, history of stroke, and gastric ulcer.  OPERATIONS:  Biopsies of the left and right breasts in 1962, cholecystectomy in 1971.  She had peritonitis in 1971, vocal cord polyps removed.  She has had a carotid endarterectomy on the right on May 28, 1993, had a recurrent biopsy of right breast on June 02, 1994, _________ July 26, 1994, angiogram June 10, 1995, colonoscopy August 23, 1996, swallowing test May 23, 1997, pelvic ultrasound July 24, 1997, and she was hospitalized in [facility] on January 14, 1998 for angina and hypertension.  She has had a  stress test on August 21, 1999, CT of the head June 02, 2000.  She had an MRI at [facility] and Doppler of the neck on January 14, 2000.  PHYSICIANS OF RECORD:  Maisie Fus A. Patty Sermons, M.D., Milus Mallick, M.D., Hedwig Morton. Juanda Chance, M.D., Almedia Balls. Randell Patient, M.D., Prentice Docker. Delena Serve., M.D., Ladona Horns. Mariel Sleet, M.D., Denman George, M.D., and Charlcie Cradle. Delford Field, M.D.  MEDICATIONS:  Currently she is on:  1. Prilosec 20 mg one q.d.  2. _________ 10 mEq b.i.d.  3. _________ G 1200 mg one b.i.d.  4. Toprol XL 50 mg one q.d.  5. Premarin 0.625 mg one q.d.  6. Diazepam 5 mg one half q.d.  7. Nitro-Dur 0.4 mg q.1h. to q.h.s.  8. Serevent two puffs b.i.d.  9. Vitamin E 400 international units b.i.d. 10. Calcium extra one q.d. 11. Ginseng 500 mg one q.d. 12. _________ one q.d. 13. Combivent four puffs q.i.d. 14. Zocor 20 mg one q.d. 15. _________ 0.35 mg one q.d. 16. She has p.r.n. Nitrostat. 17. P.r.n. albuterol inhaler. 18. P.r.n. Tylenol. 19. P.r.n. Pulmicort. 20. P.r.n. albuterol nebulizer. 21. P.r.n. Tussionex.  ALLERGIES:  CIPRO and SULFA.  REVIEW OF SYSTEMS:  Was taken extensively with _________ and is listed in the HPI.  PHYSICAL EXAMINATION:  VITAL SIGNS:  Blood pressure 140/88, pulse 83, respirations 20, O2 saturations 98% on room air.  Temperature is 98.0.  HEENT:  Essentially unremarkable except for right carotid endarterectomy scars noted.  CHEST:  Expiratory wheezes and rhonchi.  CARDIAC:  Heart sounds are regular rate and rhythm.  ABDOMEN:  Soft, nontender, positive bowel sounds.  EXTREMITIES:  Without edema.  NEUROLOGIC:  Essentially intact.  IMPRESSION/PLAN: 1. Acute exacerbation of chronic obstructive pulmonary disease with asthmatic    flare.  This was treated with refractory outpatient treatment.  Therefore    she will be admitted to [facility], treated with IV steroids, IV    antibiotics, nebulized bronchodilators.  She will also have lab  work    consisting of chest x-ray, AP and lateral and sputum culture. 2. Coronary artery disease.  Will continue her current medications and    monitor. 3. Increased cholesterol.  Will continue her medications. 4. _________ disease.  Remains stable. 5. History of stroke, is currently stable.  Again she will be followed on inpatient basis. DD:  05/19/00 TD:  05/19/00 Job: [id number]

## 2010-11-08 NOTE — Telephone Encounter (Signed)
Spoke w/ Victorino Dike and she states pt husband is going to the beach and pt didn't want to go. So pt will be staying at [facility] from 5/21-5/26 and just wanted to make Dr. Delford Field aware of this. Please advise Dr. Delford Field. Thanks  Carver Fila, CMA

## 2010-11-08 NOTE — Assessment & Plan Note (Signed)
[facility]                               PULMONARY OFFICE NOTE   NAME:[name], [name]                    MRN:          [id number]  DATE:03/06/2006                            DOB:          [birth year]    HISTORY OF PRESENT ILLNESS:  The patient is a 77 year old white female  patient of Dr. Lynelle Doctor, who has a known history of advanced COPD, primary  emphysematous component, on continuous oxygen therapy.  The patient presents  for a 1-week history of productive cough, shortness of breath and wheezing.  She denies any hemoptysis, chest pain, orthopnea, PND or leg swelling.   PAST MEDICAL HISTORY:  Reviewed.   CURRENT MEDICATIONS:  Reviewed.   PHYSICAL EXAMINATION:  The patient is a chronically ill-appearing female in  no acute distress.  She is afebrile, stable vital signs.  O2 saturation is 95% on room air.  HEENT:  Nasal mucosa shows some moderate erythema.  Nontender sinuses to  percussion.  NECK:  Supple without adenopathy.  LUNGS:  Lung sounds reveal coarse breath sounds bilaterally with few  scattered rhonchi and a few expiratory wheezes.  CARDIAC:  Regular rate.  ABDOMEN:  Soft and benign.  EXTREMITIES:  Warm without any edema.   IMPRESSION AND PLAN:  1Acute chronic obstructive pulmonary disease and  asthmatic bronchitic exacerbation.  The patient is to begin Avelox for 7  days, 6-day prednisone taper.  Robitussin DM as needed and Tussionex #4  ounces 1 teaspoon every 12 hours as needed.  The patient is aware of  sedating effect.  The patient is to return here in 2 weeks with Dr. Delford Field  as scheduled, or sooner if needed.                                   Rubye Oaks, NP                                Charlcie Cradle Delford Field, MD, FCCP   TP/MedQ  DD:  03/06/2006  DT:  03/07/2006  Job #:  [id number]

## 2010-11-08 NOTE — Assessment & Plan Note (Signed)
[facility]                             PULMONARY OFFICE NOTE   KRYSTYNE, TEWKSBURY                    MRN:          [id number]  DATE:10/12/2006                            DOB:          25-Nov-1927    Ms. Jungman returns today in followup with complaints of shortness of  breath for a week, productive cough with tan mucus, headaches, and  congestion.  She has underlying advanced chronic obstructive lung  disease with asthmatic bronchitic component.  The patient maintains on:  1. Albuterol and Atrovent nebulization 4 times daily.  2. Nexium at 40 mg daily.  3. Spiriva daily.  4. Fluticasone 2 sprays each nostril daily.  5. Oxygen 2 liters continuous.  6. Alprazolam 0.5 mg 3 times a day.  7. Other maintenance medicines listed in the chart are correct and      reviewed.   PHYSICAL EXAMINATION:  VITAL SIGNS:  Temperature 98, blood pressure  130/70, pulse 73, saturation 94% on 2 liters.  CHEST:  Inspiratory and expiratory wheeze with poor air flow.  CARDIAC:  Regular rate and rhythm without S3.  Normal S1 and S2.  ABDOMEN:  Soft, nontender.  EXTREMITIES:  No edema or clubbing.  SKIN:  Clear.  NEUROLOGIC: Exam is intact.  HEENT/NECK:  Showed no jugular venous distention, no lymphadenopathy.  Oropharynx clear.  Neck supple.   IMPRESSION:  Asthmatic bronchitic flare without evidence of acute  sinusitis with associated acute airway inflammation.   PLAN:  1. Maintain inhaled medicines as currently dosed.  2. The patient will receive Avelox 400 mg a day for a 5-day course.  3. The patient will receive prednisone on a pulsed basis 40 mg a day      tapering down 10 mg every 3 days until off.  4. We will see this patient back in return followup.     Charlcie Cradle Delford Field, MD, [facility]  Electronically Signed    PEW/MedQ  DD: 10/12/2006  DT: 10/12/2006  Job #: [id number]   cc:   Cassell Clement, M.D.

## 2010-11-08 NOTE — Telephone Encounter (Signed)
Noted  

## 2010-11-08 NOTE — Assessment & Plan Note (Signed)
[facility]                             PULMONARY OFFICE NOTE   [name], [name]                    MRN:          [id number]  DATE:06/29/2006                            DOB:          29-Jan-1928    [name] is a 77 year old white female with a history of chronic  obstructive lung disease and asthmatic bronchitic component.  The  patient has noted increased dizziness, some degree of shortness of  breath is maintained.   1. The patient maintains oxygen 2L continuous.  2. Spiriva daily.  3. She maintains albuterol and Atrovent by nebulization q.i.d.  4. Nexium 40 mg daily.  5. Alprazolam 0.5 mg t.i.d.  6. Paxil 10 mg daily.  Other maintenance medicines are listed in the chart and correct as  reviewed.   EXAM:  Temperature was 97, blood pressure 130/70, pulse 70, saturation  95% on 2L.  CHEST:  Diminished breath sounds with prolonged inspiratory phase.  No  wheeze or rhonchi.  CARDIAC:  Regular rate and rhythm without S3.  Normal S1, S2.  ABDOMEN:  Soft.  Nontender.  EXTREMITIES:  No edema or clubbing.  SKIN:  Clear.   IMPRESSION:  Chronic obstructive lung disease and asthmatic bronchitic  components.   PLAN:  To maintain inhaled medicines as currently dosed.  She is advised  to seek followup primary care with Dr. Patty Sermons, as we can only provide  pulmonary followup here.     Charlcie Cradle Delford Field, MD, [facility]  Electronically Signed    PEW/MedQ  DD: 06/29/2006  DT: 06/29/2006  Job #: [id number]

## 2010-11-08 NOTE — Consult Note (Signed)
[facility]  Patient:    [name], [name]                    MRN: [id number] Proc. Date: 01/14/00 Adm. Date:  [id number] Disc. Date: [id number] Attending:  Carmelina Peal CC:         Sheppard Penton. Stacie Acres, M.D.                          Consultation Report  REFERRING PHYSICIAN:  Sheppard Penton. Stacie Acres, M.D.  CHIEF COMPLAINT:  Right hemisensory symptoms.  HISTORY OF PRESENT ILLNESS: This is the initial emergency room evaluation for this 77 year old black female with a history of hypertension, hypercholesterolemia, coronary artery disease and CVA with left hemiparesis in 1994, who comes in the ER with acute alteration in sensation on the right side with onset at approximately 11 a.m. today.  In her words, she felt a wave of heat in the left foot, which over the next couple of minutes, moved up through the hand, face and tongue all on the with side.  Sensation of heat in past but she reports persistent residual numbness which has gradually improved.  She has no weakness.  There was some associated blurry vision and dull right sided headache.  She has no fall, bowel or bladder symptoms.  PAST MEDICAL HISTORY:  Hypertension, high cholesterol, coronary disease, angina, CVA with left hemiparesis, von Willebrands disease, right carotid endarterectomy.  FAMILY HISTORY:  Her sister a stroke at age 13.  Von Willebrands disease in her grandmother.  SOCIAL HISTORY:  She lives at home with husband.  She denies tobacco use and drinks about 2 drinks a day.  MEDICATIONS: 1. Prilosec. 2. Toprol 3. rhinocort. 4. Combivent. 5. Premarin. 6. Valium.  REVIEW OF SYSTEMS:  Remarkable for 5 pound weight gain in the last 2 weeks. Occasional sinus symptoms, occasional chest pain and shortness of breath. Diverticulosis and intermittent small-bowel obstructions.  PHYSICAL EXAMINATION:  VITAL SIGNS:  Temperature 98.4, blood pressure 180/84, pulse 78,  respirations 20.  GENERAL:  She is a healthy appearing, white female in no evident distress.  HEENT:  Cranium is normocephalic and atraumatic.  Oropharynx is benign.  NECK:  Supple without bruits.  CHEST:  Clear to auscultation.  HEART:  Regular rate and rhythm with systolic ejection murmur.  NEUROLOGIC:  Mental status:  She is awake, alert, conversant and completely appropriate.  There is no aphagia or dysarthria.  Cranial nerves:  Funduscopic exam is normal.  Pupils are equal and briskly reactive.  Extraocular movements are intact.  Visual fields full to confrontation.  There is slightly decreased sensation in the right side of the face.  There is no facial droop.  Tongue and palate move well.  Motor exam:  Normal bulk and tone.  No atrophy or fasciculations.  Slightly increased tone on the left and slight weakness in the left upper extremity.  Sensation:  There is an altered quality of light touch in the right upper and lower extremities but no true sensory loss.  Deep tendon reflexes are increased on the left.  The left toe is upgoing.  Rapid alternating movements and cerebellar functions are performed slowly on the left.  Gait exam is deferred.  LABORATORY DATA:  A CT of the head shows old lacunar infarcts but no new insult.  IMPRESSION:  A 77 year old female with hypertension and pure right hemisensory syndrome with improving over a few hours.  This  is most likely a left pure sensory lacunar stroke.  She is not a candidate for antiplatelet therapy due to her von Willebrands disease.  RECOMMENDATIONS: 1. Check carotid Dopplers in ER today. 2. If severe stenosis is seen may be candidate for a carotid endarterectomy,    if not, may be safely discharged from the ER with close followup with me in    1 week.  ADDENDUM:  Carotid Dopplers negative for critical stenosis in the extracranial internal carotids.  She will discharged from the ER and will followup with me in 1  week. DD:  01/15/00 TD:  01/16/00 Job: [id number]

## 2010-11-08 NOTE — Discharge Summary (Signed)
[facility]  Patient:    [name], [name] Visit Number: [id number] MRN: [id number]          Service Type: MED Location: [phone] Attending Physician:  Caleb Popp Dictated by:   Earley Favor, RN, MSN, ACNP Admit Date:  11/21/2001 Disc. Date: 11/23/01   CC:         Thomas A. Patty Sermons, M.D.   Discharge Summary  DISCHARGE DIAGNOSIS:  Acute exacerbation of chronic obstructive pulmonary disease.  HISTORY OF PRESENT ILLNESS:  Ms. [name] is a 77 year old white female with a history of O2-dependent chronic obstructive pulmonary disease with asthmatic bronchitis, who noticed a several-week history of increasing shortness of breath and increasing coughing spells.  She was given a prednisone prescription last week, which she did not fill, and noted a progressive point while this weekend visiting a niece in the [facility]. which is a long-term ventilator facility, when she developed an acute exacerbation of the COPD with mucus plugging and acute desaturation.  She required emergency treatment in the emergency room at [facility], and was then transferred to critical care at [facility]. [facility] for further inpatient care.  PAST MEDICAL HISTORY 1. Significant for chronic obstructive pulmonary disease with asthmatic    bronchitis and emphysema. 2. History of coronary artery disease with angina. 3. History of diverticulosis. 4. History of hypertension. 5. History of von Willenbrands disease, factor type I. 6. History of a CVA in 1994. 7. Peptic ulcer disease. 8. Gastroesophageal reflux disease.  LABORATORY DATA:  WBC 7.1, hemoglobin 10.7, hematocrit 31.0, platelets 203. Sodium 140, potassium 4.0, chloride 106, CO2 of 28, glucose 135, BUN 12, creatinine 1.0, calcium 9.0.  CPK 69, CPK-MB 0.8, troponin I 0.01.  RADIOGRAPHIC DATA:  Chest x-ray shows hyperinflation with no active disease.  HOSPITAL  COURSE: #1 - ACUTE EXACERBATION OF CHRONIC OBSTRUCTIVE PULMONARY DISEASE WITH ACUTE RESPIRATORY DISTRESS REQUIRING A TRANSFER TO [place]: Ms. Buffkin was transferred to [facility]. [address] [facility] and was treated with nebulized bronchodilators along with IV steroids and IV antibiotics.  She responded to treatment.  It was felt that there was a component of anxiety to her exacerbation of her COPD.  Therefore she was placed on Xanax 0.5 mg q.8h. p.r.n. and taken off of her Valium 2.5 mg q.d.  She reached maximal hospital benefit by November 23, 2001, and was discharged home in an improved condition.  #2 - ANXIETY:  The anxiety was addressed with changes in the anxiolytic medications, addressed in problem number one.  #3 - GASTROESOPHAGEAL REFLUX DISEASE:  She remains on her Prilosec.  #4 - CORONARY ARTERY DISEASE WITH ANGINA:  She remains on her cardiotonics as prescribed by her cardiologist, Dr. Clovis Pu. Brackbill.  These will be noted in the medication list in the discharge summary.  DIET:  A no-added salt diet.  DISCHARGE MEDICATIONS  1. Xanax 0.5 mg q.8h. p.r.n.  2. Prednisone taper - 40 mg q.d. for four days, 30 mg q.d. for four days,     20 mg q.d. for four days, 10 mg q.d. for four days, and then stop.  3. Prilosec 20 mg q.d.  4. Combivent four puffs q.i.d.  5. Potassium chloride 10 mg b.i.d.  6. Zocor 40 mg q.d.  7. Toprol XL 50 mg q.d.  8. Paxil 10 mg q.d.  9. Micardis 80 mg one q.d. 10. Nitroglycerin 0.4 mg patch, change daily. 11. Iron sulfate 325 mg  q.d. 12. Vitamins as before. 13. _________ 250 mg one tab b.i.d. until gone. 14. She has O2 at 2 L nasal cannula 24 hours q.d.  Of note, the     [facility] is being set up for her via the home health care.  DISPOSITION/CONDITION ON DISCHARGE:  An acute exacerbation of chronic obstructive pulmonary disease has resolved.  The cause of her exacerbation is most likely multifactorial with infection,  versus anxiety with severe chronic obstructive pulmonary disease or the combination of all three.  Again, she is being discharged home in an improved condition. Dictated by:   Earley Favor, RN, MSN, ACNP Attending Physician:  Caleb Popp DD:  11/23/01 TD:  11/23/01 Job: [id number]

## 2010-11-21 ENCOUNTER — Other Ambulatory Visit: Payer: Self-pay | Admitting: Cardiology

## 2010-11-21 ENCOUNTER — Other Ambulatory Visit: Payer: Self-pay | Admitting: Internal Medicine

## 2010-11-21 DIAGNOSIS — I10 Essential (primary) hypertension: Secondary | ICD-10-CM

## 2010-11-21 NOTE — Telephone Encounter (Signed)
Refilled Diovan 160mg .

## 2010-11-28 ENCOUNTER — Other Ambulatory Visit: Payer: Self-pay | Admitting: Critical Care Medicine

## 2010-12-06 ENCOUNTER — Other Ambulatory Visit: Payer: Self-pay | Admitting: *Deleted

## 2010-12-06 MED ORDER — ESOMEPRAZOLE MAGNESIUM 40 MG PO CPDR: 40.0000 mg | DELAYED_RELEASE_CAPSULE | Freq: Every day | ORAL | Status: DC

## 2010-12-06 MED ORDER — ARFORMOTEROL TARTRATE 15 MCG/2ML IN NEBU: 15.0000 ug | INHALATION_SOLUTION | Freq: Two times a day (BID) | RESPIRATORY_TRACT | Status: DC

## 2010-12-10 ENCOUNTER — Encounter: Payer: Self-pay | Admitting: Critical Care Medicine

## 2010-12-10 ENCOUNTER — Ambulatory Visit (INDEPENDENT_AMBULATORY_CARE_PROVIDER_SITE_OTHER): Payer: Medicare Other | Admitting: Critical Care Medicine

## 2010-12-10 ENCOUNTER — Telehealth: Payer: Self-pay | Admitting: Critical Care Medicine

## 2010-12-10 DIAGNOSIS — J449 Chronic obstructive pulmonary disease, unspecified: Secondary | ICD-10-CM

## 2010-12-10 NOTE — Telephone Encounter (Signed)
Pt states she was in to see PW today and forgot to give him a note from her hospice nurse, it reads as follows: Jennifer//hospice Dear Dr. Delford Field: What do you think about morphine nebs for Mrs. Colaizzi? Think it would help her breathing? Can be reached at [phone] or [phone].

## 2010-12-10 NOTE — Patient Instructions (Signed)
No change in medications. Return in        6 weeks  

## 2010-12-10 NOTE — Telephone Encounter (Signed)
Spoke with patient-aware that PW is gone for the evening. Pt states that she is uneasy about going on morphine but would like to know what PW thinks of it. Please advise.

## 2010-12-10 NOTE — Progress Notes (Signed)
Subjective:    Patient ID: [name], female    DOB: 06-26-1927, 77 y.o.   MRN: [id number]  HPI   This is a 77 y.o.   white female with advanced chronic obstructive lung disease with primary emphysematous component. The patient has chronic abdominal distention from partial small bowel dysfunction, and obstruction. Pt now in [facility]  09/20/2010  F/u copd severe. Dyspnea is worse Esp with any activity. Still with some abdominal pain. Had spell of emesis and blockage.  Has to work with husband with OBS, loses his temper   10/29/10 Note is getting weaker.  Legs give out.  Pt is falling more.  Trying to hold on to a rail and fell. Denies excess mucus.  If takes neb med will cough up mucus.    6/19 Now has spells of coughing.  When takes the brovana at night will cough at night.  Min cough in daytime. Had good respite time in [facility].  Notes bubbling in throat from esophagus.  Past Medical History  Diagnosis Date  . Von Willebrand's disease   . CORONARY ARTERY DISEASE   . DIVERTICULOSIS, COLON   . CEREBROVASCULAR ACCIDENT, HX OF 1991  . SMALL BOWEL OBSTRUCTION, HX OF   . CAROTID ENDARTERECTOMY, HX OF 06/23/1992  . COPD     end stage, O2 dep  . OSTEOPOROSIS   . HYPERTENSION   . GERD   . Depression   . Hyperlipidemia      Family History  Problem Relation Age of Onset  . Arthritis Mother   . Arthritis Father   . Heart disease Father   . Hyperlipidemia Father   . Hypertension Father      History   Social History  . Marital Status: Married    Spouse Name: N/A    Number of Children: N/A  . Years of Education: N/A   Occupational History  . Not on file.   Social History Main Topics  . Smoking status: Former Smoker -- 1.5 packs/day for 40 years    Quit date: 06/23/1988  . Smokeless tobacco: Never Used  . Alcohol Use: Yes     2 glass each night  . Drug Use: No  . Sexually Active: Not Currently   Other Topics Concern  . Not on file   Social History  Narrative  . No narrative on file     Allergies  Allergen Reactions  . Ciprofloxacin      Outpatient Prescriptions Prior to Visit  Medication Sig Dispense Refill  . acetaminophen (TYLENOL) 325 MG tablet Take 650 mg by mouth every 6 (six) hours as needed.        Marland Kitchen albuterol (PROVENTIL) (2.5 MG/3ML) 0.083% nebulizer solution Use four times daily as needed  75 mL    . ALPRAZolam (XANAX) 1 MG tablet Take 1 mg by mouth 3 (three) times daily as needed.        Marland Kitchen amLODipine (NORVASC) 5 MG tablet TAKE 1 TABLET BY MOUTH EVERY DAY  30 tablet  12  . arformoterol (BROVANA) 15 MCG/2ML NEBU Take 2 mLs (15 mcg total) by nebulization 2 (two) times daily. DX:  496  120 mL  6  . Artificial Tear Ointment (DRY EYES) OINT Apply to eye as needed.        Marland Kitchen dextromethorphan (DELSYM) 30 MG/5ML liquid Take 60 mg by mouth as needed.        Marland Kitchen esomeprazole (NEXIUM) 40 MG capsule Take 1 capsule (40 mg total) by mouth  daily before breakfast.  30 capsule  5  . fluticasone (FLONASE) 50 MCG/ACT nasal spray 2 sprays by Nasal route at bedtime as needed for rhinitis.  16 g    . furosemide (LASIX) 40 MG tablet Take 40 mg by mouth daily.        Marland Kitchen guaifenesin (ROBITUSSIN) 100 MG/5ML syrup Take 200 mg by mouth as needed.        . isosorbide mononitrate (IMDUR) 60 MG 24 hr tablet Take 1 tablet (60 mg total) by mouth daily.      Marland Kitchen lovastatin (MEVACOR) 40 MG tablet Take 40 mg by mouth daily.        . metoprolol tartrate (LOPRESSOR) 25 MG tablet Take 25 mg by mouth 2 (two) times daily.        . nitroGLYCERIN (NITROSTAT) 0.4 MG SL tablet Place 0.4 mg under the tongue every 5 (five) minutes as needed.        . nystatin (MYCOSTATIN) powder Apply topically 3 (three) times daily. To affected area between legs  60 g  0  . PARoxetine (PAXIL) 10 MG tablet Take 10 mg by mouth every morning.        . polyethylene glycol (MIRALAX / GLYCOLAX) packet Take 17 g by mouth daily.        . prochlorperazine (COMPAZINE) 10 MG tablet Take 1 tablet by  mouth every 6 hours as needed for nausea and vomiting      . sodium chloride (OCEAN) 0.65 % nasal spray 1 spray by Nasal route as needed.        . tiotropium (SPIRIVA) 18 MCG inhalation capsule Place 18 mcg into inhaler and inhale daily.        Marland Kitchen triamcinolone (KENALOG) 0.5 % ointment APPLY TOPICALLY 2 (TWO) TIMES DAILY. TO AFFECTED SKIN (HANDS AND FEET)  30 g  0  . DIOVAN 160 MG tablet TAKE 1 TABLET BY MOUTH EVERY DAY  90 tablet  3      Review of Systems  Constitutional:   No  weight loss, night sweats,  Fevers, chills, fatigue, lassitude. HEENT:   No headaches,  Difficulty swallowing,  Tooth/dental problems,  Sore throat,                No sneezing, itching, ear ache, nasal congestion, post nasal drip,   CV:  Notes anginal  chest pain,  Orthopnea, PND, swelling in lower extremities, anasarca,  Notes dizziness, no palpitations  GI  No heartburn, indigestion,  Notes abdominal pain   no nausea, vomiting, diarrhea, change in bowel habits, loss of appetite  Resp: Notes  shortness of breath with exertion and  at rest.  No excess mucus, no productive cough,  No non-productive cough,  No coughing up of blood.  No change in color of mucus.  No wheezing.  No chest wall deformity  Skin: no rash or lesions.  GU: no dysuria, change in color of urine, no urgency or frequency.  No flank pain.  MS:  No joint pain or swelling.  No decreased range of motion.  No back pain.  Psych:  No change in mood or affect. No depression or anxiety.  No memory loss.     Objective:   Physical Exam  Filed Vitals:   12/10/10 1353  BP: 110/60  Pulse: 66  Temp: 97.9 F (36.6 C)  TempSrc: Oral  Height: 4\' 11"  (1.499 m)  Weight: 149 lb 3.2 oz (67.677 kg)  SpO2: 91%    Gen: Pleasant, well-nourished, in  no distress,  normal affect  ENT: No lesions,  mouth clear,  oropharynx clear, no postnasal drip  Neck: No JVD, no TMG, no carotid bruits  Lungs: No use of accessory muscles, no dullness to percussion,  distant bs , poor airflow  Cardiovascular: RRR, heart sounds normal, no murmur or gallops, no peripheral edema  Abdomen: soft and NT, no HSM,  BS normal  Musculoskeletal: No deformities, no cyanosis or clubbing  Neuro: alert, non focal  Skin: Warm, no lesions or rashes        Assessment & Plan:   COPD Endstage copd Plan Cont hospice care Narcotics are not good choices d/t pts bowel issues     Updated Medication List Outpatient Encounter Prescriptions as of 12/10/2010  Medication Sig Dispense Refill  . acetaminophen (TYLENOL) 325 MG tablet Take 650 mg by mouth every 6 (six) hours as needed.        Marland Kitchen albuterol (PROVENTIL) (2.5 MG/3ML) 0.083% nebulizer solution Use four times daily as needed  75 mL    . ALPRAZolam (XANAX) 1 MG tablet Take 1 mg by mouth 3 (three) times daily as needed.        Marland Kitchen amLODipine (NORVASC) 5 MG tablet TAKE 1 TABLET BY MOUTH EVERY DAY  30 tablet  12  . arformoterol (BROVANA) 15 MCG/2ML NEBU Take 2 mLs (15 mcg total) by nebulization 2 (two) times daily. DX:  496  120 mL  6  . Artificial Tear Ointment (DRY EYES) OINT Apply to eye as needed.        Marland Kitchen dextromethorphan (DELSYM) 30 MG/5ML liquid Take 60 mg by mouth as needed.        Marland Kitchen esomeprazole (NEXIUM) 40 MG capsule Take 1 capsule (40 mg total) by mouth daily before breakfast.  30 capsule  5  . fluticasone (FLONASE) 50 MCG/ACT nasal spray 2 sprays by Nasal route at bedtime as needed for rhinitis.  16 g    . furosemide (LASIX) 40 MG tablet Take 40 mg by mouth daily.        Marland Kitchen guaifenesin (ROBITUSSIN) 100 MG/5ML syrup Take 200 mg by mouth as needed.        . isosorbide mononitrate (IMDUR) 60 MG 24 hr tablet Take 1 tablet (60 mg total) by mouth daily.      Marland Kitchen lovastatin (MEVACOR) 40 MG tablet Take 40 mg by mouth daily.        . metoprolol tartrate (LOPRESSOR) 25 MG tablet Take 25 mg by mouth 2 (two) times daily.        . nitroGLYCERIN (NITROSTAT) 0.4 MG SL tablet Place 0.4 mg under the tongue every 5 (five)  minutes as needed.        . nystatin (MYCOSTATIN) powder Apply topically 3 (three) times daily. To affected area between legs  60 g  0  . PARoxetine (PAXIL) 10 MG tablet Take 10 mg by mouth every morning.        . polyethylene glycol (MIRALAX / GLYCOLAX) packet Take 17 g by mouth daily.        . prochlorperazine (COMPAZINE) 10 MG tablet Take 1 tablet by mouth every 6 hours as needed for nausea and vomiting      . sodium chloride (OCEAN) 0.65 % nasal spray 1 spray by Nasal route as needed.        . tiotropium (SPIRIVA) 18 MCG inhalation capsule Place 18 mcg into inhaler and inhale daily.        Marland Kitchen triamcinolone (KENALOG) 0.5 %  ointment APPLY TOPICALLY 2 (TWO) TIMES DAILY. TO AFFECTED SKIN (HANDS AND FEET)  30 g  0  . valsartan (DIOVAN) 160 MG tablet        . DISCONTD: DIOVAN 160 MG tablet TAKE 1 TABLET BY MOUTH EVERY DAY  90 tablet  3

## 2010-12-11 NOTE — Assessment & Plan Note (Signed)
Endstage copd Plan Cont hospice care Narcotics are not good choices d/t pts bowel issues

## 2010-12-11 NOTE — Telephone Encounter (Signed)
Called, spoke with pt.  She was informed of PW's recs and verbalized understanding of this.  She is aware I will call Victorino Dike to inform her of this as well.    Called [phone] and [phone] - received Jennifer's VM stating she is off on Wednesdays.  LMOMTCB to inform Victorino Dike of pw's recs on the morphine nebs.

## 2010-12-11 NOTE — Telephone Encounter (Signed)
I do not think morphine nebulizer will help,  It will lock up her bowels.  We need to avoid narcotics in her due to her bowel issues  Pls call her hospice RN and convey this as well

## 2010-12-12 NOTE — Telephone Encounter (Signed)
Called and spoke with [facility] with Hospice and notified of recs regarding morphine nebs per PW. She verbalized understanding.

## 2010-12-27 ENCOUNTER — Other Ambulatory Visit: Payer: Self-pay | Admitting: Critical Care Medicine

## 2010-12-31 NOTE — Telephone Encounter (Signed)
Received request from [facility] from pharmacy.  However, not on pt's current med list.  Dr. Delford Field, are you ok with filling this?

## 2011-01-03 ENCOUNTER — Other Ambulatory Visit: Payer: Self-pay | Admitting: Critical Care Medicine

## 2011-01-17 ENCOUNTER — Other Ambulatory Visit: Payer: Self-pay | Admitting: Critical Care Medicine

## 2011-01-21 ENCOUNTER — Ambulatory Visit: Payer: Medicare Other | Admitting: Critical Care Medicine

## 2011-01-28 ENCOUNTER — Encounter: Payer: Self-pay | Admitting: Critical Care Medicine

## 2011-01-28 ENCOUNTER — Ambulatory Visit (INDEPENDENT_AMBULATORY_CARE_PROVIDER_SITE_OTHER): Payer: Medicare Other | Admitting: Critical Care Medicine

## 2011-01-28 DIAGNOSIS — J449 Chronic obstructive pulmonary disease, unspecified: Secondary | ICD-10-CM

## 2011-01-28 MED ORDER — DEXLANSOPRAZOLE 60 MG PO CPDR: 60.0000 mg | DELAYED_RELEASE_CAPSULE | Freq: Every day | ORAL | Status: AC

## 2011-01-28 MED ORDER — ESOMEPRAZOLE MAGNESIUM 40 MG PO CPDR: DELAYED_RELEASE_CAPSULE | ORAL | Status: DC

## 2011-01-28 NOTE — Patient Instructions (Signed)
Trial Dexilant one daily Stop nexium while on Dexilant, if you get benefit from Dexilant, let us know No other medication changes Return 1 month

## 2011-01-28 NOTE — Progress Notes (Signed)
Subjective:    Patient ID: [name], female    DOB: [birth year], 77 y.o.   MRN: [id number]  HPI   This is a 77 y.o.   white female with advanced chronic obstructive lung disease with primary emphysematous component. The patient has chronic abdominal distention from partial small bowel dysfunction, and obstruction. Pt now in [facility]  09/20/2010  F/u copd severe. Dyspnea is worse Esp with any activity. Still with some abdominal pain. Had spell of emesis and blockage.  Has to work with husband with OBS, loses his temper   10/29/10 Note is getting weaker.  Legs give out.  Pt is falling more.  Trying to hold on to a rail and fell. Denies excess mucus.  If takes neb med will cough up mucus.    6/19 Now has spells of coughing.  When takes the brovana at night will cough at night.  Min cough in daytime. Had good respite time in [facility].  Notes bubbling in throat from esophagus.  8/7 Since 6/12, had to take NTG pill for chest pain.  This occurred three weeks ago.  Stomach was full.   ? Pain in abdominal area.  Bowels would not move.  Then bowels moved and felt better.  Pure water out of bowels  Still very dyspneic and tired all the time.  Now pain in upper chest area like had cough paroxysms.    Past Medical History  Diagnosis Date  . Von Willebrand's disease   . CORONARY ARTERY DISEASE   . DIVERTICULOSIS, COLON   . CEREBROVASCULAR ACCIDENT, HX OF 1991  . SMALL BOWEL OBSTRUCTION, HX OF   . CAROTID ENDARTERECTOMY, HX OF 06/23/1992  . COPD     end stage, O2 dep  . OSTEOPOROSIS   . HYPERTENSION   . GERD   . Depression   . Hyperlipidemia      Family History  Problem Relation Age of Onset  . Arthritis Mother   . Arthritis Father   . Heart disease Father   . Hyperlipidemia Father   . Hypertension Father      History   Social History  . Marital Status: Married    Spouse Name: N/A    Number of Children: N/A  . Years of Education: N/A   Occupational History  . Not on  file.   Social History Main Topics  . Smoking status: Former Smoker -- 1.5 packs/day for 40 years    Quit date: 06/23/1988  . Smokeless tobacco: Never Used  . Alcohol Use: Yes     2 glass each night  . Drug Use: No  . Sexually Active: Not Currently   Other Topics Concern  . Not on file   Social History Narrative  . No narrative on file     Allergies  Allergen Reactions  . Ciprofloxacin      Outpatient Prescriptions Prior to Visit  Medication Sig Dispense Refill  . acetaminophen (TYLENOL) 325 MG tablet Take 650 mg by mouth every 6 (six) hours as needed.        Marland Kitchen albuterol (PROVENTIL) (2.5 MG/3ML) 0.083% nebulizer solution Use four times daily as needed  75 mL    . ALPRAZolam (XANAX) 1 MG tablet Take 1 mg by mouth 3 (three) times daily as needed.        Marland Kitchen amLODipine (NORVASC) 5 MG tablet TAKE 1 TABLET BY MOUTH EVERY DAY  30 tablet  12  . arformoterol (BROVANA) 15 MCG/2ML NEBU Take 2 mLs (15 mcg  total) by nebulization 2 (two) times daily. DX:  496  120 mL  6  . Artificial Tear Ointment (DRY EYES) OINT Apply to eye as needed.        Marland Kitchen dextromethorphan (DELSYM) 30 MG/5ML liquid Take 60 mg by mouth as needed.        . fluticasone (FLONASE) 50 MCG/ACT nasal spray 2 sprays by Nasal route at bedtime as needed for rhinitis.  16 g    . furosemide (LASIX) 40 MG tablet Take 40 mg by mouth daily.        Marland Kitchen guaifenesin (ROBITUSSIN) 100 MG/5ML syrup Take 200 mg by mouth as needed.        . isosorbide mononitrate (IMDUR) 60 MG 24 hr tablet Take 1 tablet (60 mg total) by mouth daily.      Marland Kitchen lovastatin (MEVACOR) 40 MG tablet Take 40 mg by mouth daily.        . metoprolol tartrate (LOPRESSOR) 25 MG tablet Take 25 mg by mouth 2 (two) times daily.        . nitroGLYCERIN (NITROSTAT) 0.4 MG SL tablet Place 0.4 mg under the tongue every 5 (five) minutes as needed.        . nystatin (MYCOSTATIN) powder Apply topically 3 (three) times daily. To affected area between legs  60 g  0  . PARoxetine (PAXIL) 10  MG tablet Take 10 mg by mouth every morning.        . polyethylene glycol (MIRALAX / GLYCOLAX) packet Take 17 g by mouth daily.        . polyethylene glycol powder (GLYCOLAX/MIRALAX) powder USE AS DIRECTED ONCE DAILY  527 g  0  . PROAIR HFA 108 (90 BASE) MCG/ACT inhaler INHALE 1 TO 2 PUFFS EVERY 4 TO 6 HOURS AS NEEDED  1 Inhaler  6  . prochlorperazine (COMPAZINE) 10 MG tablet Take 1 tablet by mouth every 6 hours as needed for nausea and vomiting      . sodium chloride (OCEAN) 0.65 % nasal spray 1 spray by Nasal route as needed.        Marland Kitchen SPIRIVA HANDIHALER 18 MCG inhalation capsule USE ONE CAPSULE IN HANDIHALER AS DIRECTED ONCE DAILY  30 each  6  . valsartan (DIOVAN) 160 MG tablet 1/2 tablet once daily      . esomeprazole (NEXIUM) 40 MG capsule Take 1 capsule (40 mg total) by mouth daily before breakfast.  30 capsule  5  . triamcinolone (KENALOG) 0.5 % ointment APPLY TOPICALLY 2 (TWO) TIMES DAILY. TO AFFECTED SKIN (HANDS AND FEET)  30 g  0      Review of Systems  Constitutional:   No  weight loss, night sweats,  Fevers, chills, fatigue, lassitude. HEENT:   No headaches,  Difficulty swallowing,  Tooth/dental problems,  Sore throat,                No sneezing, itching, ear ache, nasal congestion, post nasal drip,   CV:  Notes anginal  chest pain,  Orthopnea, PND, swelling in lower extremities, anasarca,  Notes dizziness, no palpitations  GI  No heartburn, indigestion,  Notes abdominal pain   no nausea, vomiting, diarrhea, change in bowel habits, loss of appetite  Resp: Notes  shortness of breath with exertion and  at rest.  No excess mucus, no productive cough,  No non-productive cough,  No coughing up of blood.  No change in color of mucus.  No wheezing.  No chest wall deformity  Skin: no rash  or lesions.  GU: no dysuria, change in color of urine, no urgency or frequency.  No flank pain.  MS:  No joint pain or swelling.  No decreased range of motion.  No back pain.  Psych:  No change in  mood or affect. No depression or anxiety.  No memory loss.     Objective:   Physical Exam  Filed Vitals:   01/28/11 1342  BP: 108/66  Pulse: 72  Temp: 98 F (36.7 C)  TempSrc: Oral  Height: 4\' 11"  (1.499 m)  Weight: 149 lb 6.4 oz (67.767 kg)  SpO2: 93%    Gen: Pleasant, well-nourished, in no distress,  normal affect  ENT: No lesions,  mouth clear,  oropharynx clear, no postnasal drip  Neck: No JVD, no TMG, no carotid bruits  Lungs: No use of accessory muscles, no dullness to percussion, distant bs , poor airflow  Cardiovascular: RRR, heart sounds normal, no murmur or gallops, no peripheral edema  Abdomen: soft and NT, no HSM,  BS normal  Musculoskeletal: No deformities, no cyanosis or clubbing  Neuro: alert, non focal  Skin: Warm, no lesions or rashes        Assessment & Plan:   COPD COPD endstage GOLDs stage IV HPCG following Severe GERD d/t gastroparesis and SBO partial with backwash Plan Trial dexilant      Updated Medication List Outpatient Encounter Prescriptions as of 01/28/2011  Medication Sig Dispense Refill  . acetaminophen (TYLENOL) 325 MG tablet Take 650 mg by mouth every 6 (six) hours as needed.        Marland Kitchen albuterol (PROVENTIL) (2.5 MG/3ML) 0.083% nebulizer solution Use four times daily as needed  75 mL    . ALPRAZolam (XANAX) 1 MG tablet Take 1 mg by mouth 3 (three) times daily as needed.        Marland Kitchen amLODipine (NORVASC) 5 MG tablet TAKE 1 TABLET BY MOUTH EVERY DAY  30 tablet  12  . arformoterol (BROVANA) 15 MCG/2ML NEBU Take 2 mLs (15 mcg total) by nebulization 2 (two) times daily. DX:  496  120 mL  6  . Artificial Tear Ointment (DRY EYES) OINT Apply to eye as needed.        Marland Kitchen dextromethorphan (DELSYM) 30 MG/5ML liquid Take 60 mg by mouth as needed.        Marland Kitchen esomeprazole (NEXIUM) 40 MG capsule Hold until dexilant trial complete  30 capsule  5  . fluticasone (FLONASE) 50 MCG/ACT nasal spray 2 sprays by Nasal route at bedtime as needed for rhinitis.   16 g    . furosemide (LASIX) 40 MG tablet Take 40 mg by mouth daily.        Marland Kitchen guaifenesin (ROBITUSSIN) 100 MG/5ML syrup Take 200 mg by mouth as needed.        . isosorbide mononitrate (IMDUR) 60 MG 24 hr tablet Take 1 tablet (60 mg total) by mouth daily.      Marland Kitchen lovastatin (MEVACOR) 40 MG tablet Take 40 mg by mouth daily.        . metoprolol tartrate (LOPRESSOR) 25 MG tablet Take 25 mg by mouth 2 (two) times daily.        . nitroGLYCERIN (NITROSTAT) 0.4 MG SL tablet Place 0.4 mg under the tongue every 5 (five) minutes as needed.        . nystatin (MYCOSTATIN) powder Apply topically 3 (three) times daily. To affected area between legs  60 g  0  . PARoxetine (PAXIL) 10 MG tablet  Take 10 mg by mouth every morning.        . polyethylene glycol (MIRALAX / GLYCOLAX) packet Take 17 g by mouth daily.        . polyethylene glycol powder (GLYCOLAX/MIRALAX) powder USE AS DIRECTED ONCE DAILY  527 g  0  . PROAIR HFA 108 (90 BASE) MCG/ACT inhaler INHALE 1 TO 2 PUFFS EVERY 4 TO 6 HOURS AS NEEDED  1 Inhaler  6  . prochlorperazine (COMPAZINE) 10 MG tablet Take 1 tablet by mouth every 6 hours as needed for nausea and vomiting      . sodium chloride (OCEAN) 0.65 % nasal spray 1 spray by Nasal route as needed.        Marland Kitchen SPIRIVA HANDIHALER 18 MCG inhalation capsule USE ONE CAPSULE IN HANDIHALER AS DIRECTED ONCE DAILY  30 each  6  . valsartan (DIOVAN) 160 MG tablet 1/2 tablet once daily      . DISCONTD: esomeprazole (NEXIUM) 40 MG capsule Take 1 capsule (40 mg total) by mouth daily before breakfast.  30 capsule  5  . dexlansoprazole (DEXILANT) 60 MG capsule Take 1 capsule (60 mg total) by mouth daily.  5 capsule  0  . DISCONTD: triamcinolone (KENALOG) 0.5 % ointment APPLY TOPICALLY 2 (TWO) TIMES DAILY. TO AFFECTED SKIN (HANDS AND FEET)  30 g  0

## 2011-01-30 NOTE — Assessment & Plan Note (Signed)
COPD endstage GOLDs stage IV HPCG following Severe GERD d/t gastroparesis and SBO partial with backwash Plan Trial dexilant

## 2011-02-03 ENCOUNTER — Telehealth: Payer: Self-pay | Admitting: Critical Care Medicine

## 2011-02-03 NOTE — Telephone Encounter (Signed)
Spoke with the pt and she states that the nexium works better for her then the dexilant and she states she went back on the nexium. Just wanted to let PW know. I will forward as FYI.  Carron Curie, CMA

## 2011-02-05 NOTE — Telephone Encounter (Signed)
Noted  

## 2011-02-19 ENCOUNTER — Other Ambulatory Visit: Payer: Self-pay | Admitting: Critical Care Medicine

## 2011-02-20 ENCOUNTER — Telehealth: Payer: Self-pay | Admitting: Critical Care Medicine

## 2011-02-20 NOTE — Telephone Encounter (Signed)
Spoke with Victorino Dike to verify msg. She states wants to let PW know that pt will be at [facility] x 5 days while her spouse is at [facility] [employer]. Will sign and forward to PW as FYI.

## 2011-03-04 ENCOUNTER — Encounter: Payer: Self-pay | Admitting: Critical Care Medicine

## 2011-03-04 ENCOUNTER — Ambulatory Visit (INDEPENDENT_AMBULATORY_CARE_PROVIDER_SITE_OTHER): Payer: Medicare Other | Admitting: Critical Care Medicine

## 2011-03-04 VITALS — BP 120/80 | HR 67 | Temp 97.9°F | Ht 59.0 in | Wt 148.2 lb

## 2011-03-04 DIAGNOSIS — J449 Chronic obstructive pulmonary disease, unspecified: Secondary | ICD-10-CM

## 2011-03-04 DIAGNOSIS — Z23 Encounter for immunization: Secondary | ICD-10-CM

## 2011-03-04 NOTE — Progress Notes (Signed)
Subjective:    Patient ID: [name], female    DOB: 1927/09/03, 77 y.o.   MRN: [id number]  HPI   This is a 77 y.o.   white female with advanced chronic obstructive lung disease with primary emphysematous component. The patient has chronic abdominal distention from partial small bowel dysfunction, and obstruction. Pt now in [facility]  09/20/2010  F/u copd severe. Dyspnea is worse Esp with any activity. Still with some abdominal pain. Had spell of emesis and blockage.  Has to work with husband with OBS, loses his temper   10/29/10 Note is getting weaker.  Legs give out.  Pt is falling more.  Trying to hold on to a rail and fell. Denies excess mucus.  If takes neb med will cough up mucus.    6/19 Now has spells of coughing.  When takes the brovana at night will cough at night.  Min cough in daytime. Had good respite time in [facility].  Notes bubbling in throat from esophagus.  8/7 Since 6/12, had to take NTG pill for chest pain.  This occurred three weeks ago.  Stomach was full.   ? Pain in abdominal area.  Bowels would not move.  Then bowels moved and felt better.  Pure water out of bowels  Still very dyspneic and tired all the time.  Now pain in upper chest area like had cough paroxysms.    03/04/2011 Noting more fatigue and dyspnea.  Weak all over. Was in respite at [facility] place.  Notes some headache.  Heart racing at times. Bowels an issue at hospice.  Had solid bowel movements.    Past Medical History  Diagnosis Date  . Von Willebrand's disease   . CORONARY ARTERY DISEASE   . DIVERTICULOSIS, COLON   . CEREBROVASCULAR ACCIDENT, HX OF 1991  . SMALL BOWEL OBSTRUCTION, HX OF   . CAROTID ENDARTERECTOMY, HX OF 06/23/1992  . COPD     end stage, O2 dep  . OSTEOPOROSIS   . HYPERTENSION   . GERD   . Depression   . Hyperlipidemia      Family History  Problem Relation Age of Onset  . Arthritis Mother   . Arthritis Father   . Heart disease Father   . Hyperlipidemia Father     . Hypertension Father      History   Social History  . Marital Status: Married    Spouse Name: N/A    Number of Children: N/A  . Years of Education: N/A   Occupational History  . Not on file.   Social History Main Topics  . Smoking status: Former Smoker -- 1.5 packs/day for 40 years    Quit date: 06/23/1988  . Smokeless tobacco: Never Used  . Alcohol Use: Yes     2 glass each night  . Drug Use: No  . Sexually Active: Not Currently   Other Topics Concern  . Not on file   Social History Narrative  . No narrative on file     Allergies  Allergen Reactions  . Ciprofloxacin      Outpatient Prescriptions Prior to Visit  Medication Sig Dispense Refill  . acetaminophen (TYLENOL) 325 MG tablet Take 650 mg by mouth every 6 (six) hours as needed.        Marland Kitchen albuterol (PROVENTIL) (2.5 MG/3ML) 0.083% nebulizer solution Use four times daily as needed  75 mL    . ALPRAZolam (XANAX) 1 MG tablet Take 1 mg by mouth 3 (three) times daily  as needed.        Marland Kitchen amLODipine (NORVASC) 5 MG tablet TAKE 1 TABLET BY MOUTH EVERY DAY  30 tablet  12  . arformoterol (BROVANA) 15 MCG/2ML NEBU Take 2 mLs (15 mcg total) by nebulization 2 (two) times daily. DX:  496  120 mL  6  . dextromethorphan (DELSYM) 30 MG/5ML liquid Take 60 mg by mouth as needed.        Marland Kitchen esomeprazole (NEXIUM) 40 MG capsule Hold until dexilant trial complete  30 capsule  5  . fluticasone (FLONASE) 50 MCG/ACT nasal spray 2 sprays by Nasal route at bedtime as needed for rhinitis.  16 g    . furosemide (LASIX) 40 MG tablet Take 40 mg by mouth daily.        Marland Kitchen guaifenesin (ROBITUSSIN) 100 MG/5ML syrup Take 200 mg by mouth as needed.        . isosorbide mononitrate (IMDUR) 60 MG 24 hr tablet Take 1 tablet (60 mg total) by mouth daily.      Marland Kitchen lovastatin (MEVACOR) 40 MG tablet Take 40 mg by mouth daily.        . metoprolol tartrate (LOPRESSOR) 25 MG tablet Take 25 mg by mouth 2 (two) times daily.        . nitroGLYCERIN (NITROSTAT) 0.4 MG  SL tablet Place 0.4 mg under the tongue every 5 (five) minutes as needed.        Marland Kitchen PARoxetine (PAXIL) 10 MG tablet Take 10 mg by mouth every morning.        . polyethylene glycol powder (GLYCOLAX/MIRALAX) powder USE AS DIRECTED ONCE DAILY  527 g  6  . PROAIR HFA 108 (90 BASE) MCG/ACT inhaler INHALE 1 TO 2 PUFFS EVERY 4 TO 6 HOURS AS NEEDED  1 Inhaler  6  . prochlorperazine (COMPAZINE) 10 MG tablet Take 1 tablet by mouth every 6 hours as needed for nausea and vomiting      . sodium chloride (OCEAN) 0.65 % nasal spray 1 spray by Nasal route as needed.        Marland Kitchen SPIRIVA HANDIHALER 18 MCG inhalation capsule USE ONE CAPSULE IN HANDIHALER AS DIRECTED ONCE DAILY  30 each  6  . valsartan (DIOVAN) 160 MG tablet 1/2 tablet once daily      . polyethylene glycol (MIRALAX / GLYCOLAX) packet Take 17 g by mouth daily.        . Artificial Tear Ointment (DRY EYES) OINT Apply to eye as needed.        . nystatin (MYCOSTATIN) powder Apply topically 3 (three) times daily. To affected area between legs  60 g  0      Review of Systems  Constitutional:   No  weight loss, night sweats,  Fevers, chills, fatigue, lassitude. HEENT:   No headaches,  Difficulty swallowing,  Tooth/dental problems,  Sore throat,                No sneezing, itching, ear ache, nasal congestion, post nasal drip,   CV:  Notes anginal  chest pain,  Orthopnea, PND, swelling in lower extremities, anasarca,  Notes dizziness, no palpitations  GI  No heartburn, indigestion,  Notes abdominal pain   no nausea, vomiting, diarrhea, change in bowel habits, loss of appetite  Resp: Notes  shortness of breath with exertion and  at rest.  No excess mucus, no productive cough,  No non-productive cough,  No coughing up of blood.  No change in color of mucus.  No wheezing.  No chest wall deformity  Skin: no rash or lesions.  GU: no dysuria, change in color of urine, no urgency or frequency.  No flank pain.  MS:  No joint pain or swelling.  No decreased  range of motion.  No back pain.  Psych:  No change in mood or affect. No depression or anxiety.  No memory loss.     Objective:   Physical Exam  Filed Vitals:   03/04/11 1349 03/04/11 1350  BP: 120/80   Pulse: 75 67  Temp: 97.9 F (36.6 C)   TempSrc: Oral   Height: 4\' 11"  (1.499 m)   Weight: 148 lb 3.2 oz (67.223 kg)   SpO2: 74% 98%    Gen: Pleasant, well-nourished, in no distress,  normal affect  ENT: No lesions,  mouth clear,  oropharynx clear, no postnasal drip  Neck: No JVD, no TMG, no carotid bruits  Lungs: No use of accessory muscles, no dullness to percussion, distant bs , poor airflow  Cardiovascular: RRR, heart sounds normal, no murmur or gallops, no peripheral edema  Abdomen: soft and NT, no HSM,  BS normal  Musculoskeletal: No deformities, no cyanosis or clubbing  Neuro: alert, non focal  Skin: Warm, no lesions or rashes        Assessment & Plan:   COPD COPD endstage GOLDs stage IV HPCG following The patient is reaching into her disease process and needs continued palliation poor hospice Plan Continue palliative care via hospice and  palliative Care      Updated Medication List Outpatient Encounter Prescriptions as of 03/04/2011  Medication Sig Dispense Refill  . acetaminophen (TYLENOL) 325 MG tablet Take 650 mg by mouth every 6 (six) hours as needed.        Marland Kitchen albuterol (PROVENTIL) (2.5 MG/3ML) 0.083% nebulizer solution Use four times daily as needed  75 mL    . ALPRAZolam (XANAX) 1 MG tablet Take 1 mg by mouth 3 (three) times daily as needed.        Marland Kitchen amLODipine (NORVASC) 5 MG tablet TAKE 1 TABLET BY MOUTH EVERY DAY  30 tablet  12  . arformoterol (BROVANA) 15 MCG/2ML NEBU Take 2 mLs (15 mcg total) by nebulization 2 (two) times daily. DX:  496  120 mL  6  . dextromethorphan (DELSYM) 30 MG/5ML liquid Take 60 mg by mouth as needed.        Marland Kitchen esomeprazole (NEXIUM) 40 MG capsule Hold until dexilant trial complete  30 capsule  5  . fluticasone  (FLONASE) 50 MCG/ACT nasal spray 2 sprays by Nasal route at bedtime as needed for rhinitis.  16 g    . furosemide (LASIX) 40 MG tablet Take 40 mg by mouth daily.        Marland Kitchen guaifenesin (ROBITUSSIN) 100 MG/5ML syrup Take 200 mg by mouth as needed.        . hydroxypropyl methylcellulose (ISOPTO TEARS) 2.5 % ophthalmic solution 1 drop as needed.        . isosorbide mononitrate (IMDUR) 60 MG 24 hr tablet Take 1 tablet (60 mg total) by mouth daily.      Marland Kitchen lovastatin (MEVACOR) 40 MG tablet Take 40 mg by mouth daily.        . metoprolol tartrate (LOPRESSOR) 25 MG tablet Take 25 mg by mouth 2 (two) times daily.        . nitroGLYCERIN (NITROSTAT) 0.4 MG SL tablet Place 0.4 mg under the tongue every 5 (five) minutes as needed.        Marland Kitchen  PARoxetine (PAXIL) 10 MG tablet Take 10 mg by mouth every morning.        . polyethylene glycol powder (GLYCOLAX/MIRALAX) powder USE AS DIRECTED ONCE DAILY  527 g  6  . PROAIR HFA 108 (90 BASE) MCG/ACT inhaler INHALE 1 TO 2 PUFFS EVERY 4 TO 6 HOURS AS NEEDED  1 Inhaler  6  . prochlorperazine (COMPAZINE) 10 MG tablet Take 1 tablet by mouth every 6 hours as needed for nausea and vomiting      . sodium chloride (OCEAN) 0.65 % nasal spray 1 spray by Nasal route as needed.        Marland Kitchen SPIRIVA HANDIHALER 18 MCG inhalation capsule USE ONE CAPSULE IN HANDIHALER AS DIRECTED ONCE DAILY  30 each  6  . valsartan (DIOVAN) 160 MG tablet 1/2 tablet once daily      . DISCONTD: polyethylene glycol (MIRALAX / GLYCOLAX) packet Take 17 g by mouth daily.        Marland Kitchen DISCONTD: Artificial Tear Ointment (DRY EYES) OINT Apply to eye as needed.        Marland Kitchen DISCONTD: nystatin (MYCOSTATIN) powder Apply topically 3 (three) times daily. To affected area between legs  60 g  0

## 2011-03-04 NOTE — Assessment & Plan Note (Signed)
COPD endstage GOLDs stage IV HPCG following The patient is reaching into her disease process and needs continued palliation poor hospice Plan Continue palliative care via hospice and  palliative Care

## 2011-03-04 NOTE — Patient Instructions (Signed)
No change in medications. Return in     1 months

## 2011-03-07 ENCOUNTER — Other Ambulatory Visit: Payer: Self-pay | Admitting: Critical Care Medicine

## 2011-03-13 ENCOUNTER — Ambulatory Visit (INDEPENDENT_AMBULATORY_CARE_PROVIDER_SITE_OTHER): Payer: Medicare Other | Admitting: Cardiology

## 2011-03-13 ENCOUNTER — Other Ambulatory Visit (INDEPENDENT_AMBULATORY_CARE_PROVIDER_SITE_OTHER): Payer: Medicare Other | Admitting: *Deleted

## 2011-03-13 ENCOUNTER — Encounter: Payer: Self-pay | Admitting: Cardiology

## 2011-03-13 ENCOUNTER — Other Ambulatory Visit: Payer: Self-pay | Admitting: Cardiology

## 2011-03-13 VITALS — BP 140/68 | HR 70 | Wt 147.0 lb

## 2011-03-13 DIAGNOSIS — E785 Hyperlipidemia, unspecified: Secondary | ICD-10-CM

## 2011-03-13 DIAGNOSIS — I2510 Atherosclerotic heart disease of native coronary artery without angina pectoris: Secondary | ICD-10-CM

## 2011-03-13 DIAGNOSIS — I10 Essential (primary) hypertension: Secondary | ICD-10-CM

## 2011-03-13 DIAGNOSIS — J449 Chronic obstructive pulmonary disease, unspecified: Secondary | ICD-10-CM

## 2011-03-13 DIAGNOSIS — E7800 Pure hypercholesterolemia, unspecified: Secondary | ICD-10-CM

## 2011-03-13 DIAGNOSIS — I119 Hypertensive heart disease without heart failure: Secondary | ICD-10-CM

## 2011-03-13 LAB — LIPID PANEL
Cholesterol: 232 mg/dL — ABNORMAL HIGH (ref 0–200)
HDL: 39.5 mg/dL (ref >39.00)

## 2011-03-13 LAB — CBC WITH DIFFERENTIAL/PLATELET
Basophils Absolute: 0 10*3/uL (ref 0.0–0.1)
Eosinophils Absolute: 0.1 10*3/uL (ref 0.0–0.7)
Hemoglobin: 11.1 g/dL — ABNORMAL LOW (ref 12.0–15.0)
Lymphocytes Relative: 31.1 % (ref 12.0–46.0)
MCHC: 33.2 g/dL (ref 30.0–36.0)
Neutro Abs: 2.8 10*3/uL (ref 1.4–7.7)
Platelets: 206 10*3/uL (ref 150.0–400.0)
RDW: 13.7 % (ref 11.5–14.6)

## 2011-03-13 LAB — BASIC METABOLIC PANEL
BUN: 14 mg/dL (ref 6–23)
Chloride: 100 mEq/L (ref 96–112)
GFR: 67.32 mL/min (ref >60.00)
Glucose, Bld: 97 mg/dL (ref 70–99)
Potassium: 4.3 mEq/L (ref 3.5–5.1)

## 2011-03-13 LAB — HEPATIC FUNCTION PANEL
ALT: 19 U/L (ref 0–35)
AST: 23 U/L (ref 0–37)
Albumin: 4.1 g/dL (ref 3.5–5.2)

## 2011-03-13 LAB — LDL CHOLESTEROL, DIRECT: Direct LDL: 115.9 mg/dL

## 2011-03-13 NOTE — Assessment & Plan Note (Signed)
The patient has not been expressing any headaches or dizzy spells referable to her blood pressure.  She has not been aware of any palpitations.

## 2011-03-13 NOTE — Progress Notes (Signed)
[name] Date of Birth:  March 22, 1928 [facility] / [facility] [address]   [address] [place], [place]  [address] [phone]           Fax   [phone]  History of Present Illness: This pleasant 77 year old woman is seen for a scheduled followup office visit.  He has a complex past medical history.  She has severe COPD and is on home oxygen.  She has a history of ischemic heart disease with angina pectoris.  She has a history of von Willebrand's disorder.  She must avoid aspirin and antiplatelet drugs.  She has been with hospice for 18 months.  She had cardiac catheterization in 1996 by Dr. Deborah Chalk showing irregular plaque in the proximal LAD with mild diffuse coronary atherosclerosis of the right coronary system but otherwise no significant obstructive disease.  Her last nuclear stress test was 09/21/03 using adenosine and there was no evidence of ischemia and she had normal LV function.  Current Outpatient Prescriptions  Medication Sig Dispense Refill  . acetaminophen (TYLENOL) 325 MG tablet Take 650 mg by mouth every 6 (six) hours as needed.        Marland Kitchen albuterol (PROVENTIL) (2.5 MG/3ML) 0.083% nebulizer solution Use four times daily as needed  75 mL    . ALPRAZolam (XANAX) 1 MG tablet Take 1 mg by mouth 3 (three) times daily as needed.        Marland Kitchen amLODipine (NORVASC) 5 MG tablet TAKE 1 TABLET BY MOUTH EVERY DAY  30 tablet  12  . arformoterol (BROVANA) 15 MCG/2ML NEBU Take 2 mLs (15 mcg total) by nebulization 2 (two) times daily. DX:  496  120 mL  6  . dextromethorphan (DELSYM) 30 MG/5ML liquid Take 60 mg by mouth as needed.        Marland Kitchen esomeprazole (NEXIUM) 40 MG capsule Hold until dexilant trial complete  30 capsule  5  . fluticasone (FLONASE) 50 MCG/ACT nasal spray USE 2 SPRAYS IN BOTH NOSTRILS AT BEDTIME  16 g  0  . furosemide (LASIX) 40 MG tablet Take 40 mg by mouth daily.        Marland Kitchen guaifenesin (ROBITUSSIN) 100 MG/5ML syrup Take 200 mg by mouth as needed.        .  hydroxypropyl methylcellulose (ISOPTO TEARS) 2.5 % ophthalmic solution 1 drop as needed.        . isosorbide mononitrate (IMDUR) 60 MG 24 hr tablet Take 1 tablet (60 mg total) by mouth daily.      Marland Kitchen lovastatin (MEVACOR) 40 MG tablet Take 40 mg by mouth daily.        . metoprolol tartrate (LOPRESSOR) 25 MG tablet Take 25 mg by mouth 2 (two) times daily.        . nitroGLYCERIN (NITROSTAT) 0.4 MG SL tablet Place 0.4 mg under the tongue every 5 (five) minutes as needed.        Marland Kitchen PARoxetine (PAXIL) 10 MG tablet Take 10 mg by mouth every morning.        . polyethylene glycol powder (GLYCOLAX/MIRALAX) powder USE AS DIRECTED ONCE DAILY  527 g  6  . PROAIR HFA 108 (90 BASE) MCG/ACT inhaler INHALE 1 TO 2 PUFFS EVERY 4 TO 6 HOURS AS NEEDED  1 Inhaler  6  . prochlorperazine (COMPAZINE) 10 MG tablet Take 1 tablet by mouth every 6 hours as needed for nausea and vomiting      . sodium chloride (OCEAN) 0.65 % nasal spray 1 spray  by Nasal route as needed.        Marland Kitchen SPIRIVA HANDIHALER 18 MCG inhalation capsule USE ONE CAPSULE IN HANDIHALER AS DIRECTED ONCE DAILY  30 each  6  . valsartan (DIOVAN) 160 MG tablet 1/2 tablet once daily        Allergies  Allergen Reactions  . Ciprofloxacin     Patient Active Problem List  Diagnoses  . [facility] DISEASE  . ADJUSTMENT DISORDER WITH ANXIETY  . HYPERTENSION  . CORONARY ARTERY DISEASE  . COPD  . GERD  . DIVERTICULOSIS, COLON  . INFECTION, URINARY TRACT NOS  . OSTEOPOROSIS  . CEREBROVASCULAR ACCIDENT, HX OF  . SMALL BOWEL OBSTRUCTION, HX OF  . CAROTID ENDARTERECTOMY, HX OF  . Dyshidrotic eczema  . Vaginitis  . Hyperlipidemia    History  Smoking status  . Former Smoker -- 1.5 packs/day for 40 years  . Quit date: 06/23/1988  Smokeless tobacco  . Never Used    History  Alcohol Use  . Yes    2 glass each night    Family History  Problem Relation Age of Onset  . Arthritis Mother   . Arthritis Father   . Heart disease Father   .  Hyperlipidemia Father   . Hypertension Father     Review of Systems: Constitutional: no fever chills diaphoresis or fatigue or change in weight.  Head and neck: no hearing loss, no epistaxis, no photophobia or visual disturbance. Respiratory: No cough, shortness of breath or wheezing. Cardiovascular: No chest pain peripheral edema, palpitations. Gastrointestinal: No abdominal distention, no abdominal pain, no change in bowel habits hematochezia or melena. Genitourinary: No dysuria, no frequency, no urgency, no nocturia. Musculoskeletal:No arthralgias, no back pain, no gait disturbance or myalgias. Neurological: No dizziness, no headaches, no numbness, no seizures, no syncope, no weakness, no tremors. Hematologic: No lymphadenopathy, no easy bruising. Psychiatric: No confusion, no hallucinations, no sleep disturbance.    Physical Exam: Filed Vitals:   03/13/11 1325  BP: 140/68  Pulse: 70  The general appearance reveals a well-developed somewhat cushingoid woman in no acute distress.Pupils equal and reactive.   Extraocular Movements are full.  There is no scleral icterus.  The mouth and pharynx are normal.  The neck is supple.  The carotids reveal no bruits.  The jugular venous pressure is normal.  The thyroid is not enlarged.  There is no lymphadenopathy.  The chest reveals bilateral expiratory wheezes. The precordium is quiet.  The first heart sound is normal.  The second heart sound is physiologically split.  There is no murmur gallop rub or click.  There is no abnormal lift or heave.  The abdomen is soft and nontender. Bowel sounds are normal. The liver and spleen are not enlarged. There Are no abdominal masses. There are no bruits.  Normal extremityStrength is normal and symmetrical in all extremities.  There is no lateralizing weakness.  There are no sensory deficits.      Assessment / Plan: Continue same medication.  Await results of today's labs.  Recheck in 4 months.  The  patient has been very anxious about her husband's health who is developing signs of early dementia and her husband does have an appointment soon to see the neurologist.

## 2011-03-13 NOTE — Assessment & Plan Note (Signed)
The patient is not having any side effects from her lovastatin 40 mg daily.  Denies any myalgias etc.  Her LDL today is Slightly elevated at 115.We will continue same dose of lovastatin and continue to work hard on diet

## 2011-03-13 NOTE — Assessment & Plan Note (Signed)
The patient has known nonobstructive mild coronary disease by cardiac catheterization in 1996 and she had a normal adenosine Cardiolite stress test in 2005.  She does have clinical angina pectoris.  She reports that since we increased her isosorbide mononitrate up to 60 mg each morning the frequency of her chest pain has decreased significantly.

## 2011-03-14 ENCOUNTER — Telehealth: Payer: Self-pay | Admitting: *Deleted

## 2011-03-14 NOTE — Telephone Encounter (Signed)
Advised Patient of results, and try to avoid high cholesterol foods

## 2011-03-14 NOTE — Telephone Encounter (Signed)
Message copied by Royanne Foots on Fri Mar 14, 2011  9:58 AM ------      Message from: Cassell Clement      Created: Thu Mar 13, 2011  9:36 PM       Please report.  Her hemoglobin is down slightly at 11.1. Her white count is normal.Her cholesterol and triglycerides are still highHer potassium and sodium are normal.  Kidney function isNormal.  The liver tests are normal.  Continue same medication and to watch high cholesterol foods carefully

## 2011-03-14 NOTE — Progress Notes (Signed)
Advised patient of results.  

## 2011-03-19 ENCOUNTER — Telehealth: Payer: Self-pay | Admitting: *Deleted

## 2011-03-19 NOTE — Telephone Encounter (Signed)
Message copied by Eugenia Pancoast on Wed Mar 19, 2011 11:53 AM ------      Message from: Cassell Clement      Created: Thu Mar 13, 2011  9:37 PM       LDL is slightly elevated 115.  Continue lovastatin 40 mg daily.

## 2011-03-19 NOTE — Telephone Encounter (Signed)
Advised of labs 

## 2011-03-25 ENCOUNTER — Other Ambulatory Visit: Payer: Self-pay | Admitting: *Deleted

## 2011-03-25 MED ORDER — ALPRAZOLAM 1 MG PO TABS: 1.0000 mg | ORAL_TABLET | Freq: Three times a day (TID) | ORAL | Status: DC | PRN

## 2011-04-09 ENCOUNTER — Telehealth: Payer: Self-pay | Admitting: Critical Care Medicine

## 2011-04-09 ENCOUNTER — Other Ambulatory Visit: Payer: Self-pay | Admitting: Critical Care Medicine

## 2011-04-09 MED ORDER — PAROXETINE HCL 10 MG PO TABS: 10.0000 mg | ORAL_TABLET | ORAL | Status: DC

## 2011-04-09 NOTE — Telephone Encounter (Signed)
Pls advise if okay for refills on Paxil.

## 2011-04-09 NOTE — Telephone Encounter (Signed)
Spoke with pt and notified that we have refilled her paroxetine. Rx sent to pharm with rf x 5

## 2011-04-09 NOTE — Telephone Encounter (Signed)
Ok to refill 

## 2011-04-10 ENCOUNTER — Other Ambulatory Visit: Payer: Self-pay | Admitting: *Deleted

## 2011-04-15 ENCOUNTER — Encounter: Payer: Self-pay | Admitting: Internal Medicine

## 2011-04-15 ENCOUNTER — Ambulatory Visit (INDEPENDENT_AMBULATORY_CARE_PROVIDER_SITE_OTHER): Payer: Medicare Other | Admitting: Internal Medicine

## 2011-04-15 DIAGNOSIS — J4489 Other specified chronic obstructive pulmonary disease: Secondary | ICD-10-CM

## 2011-04-15 DIAGNOSIS — I10 Essential (primary) hypertension: Secondary | ICD-10-CM

## 2011-04-15 DIAGNOSIS — E785 Hyperlipidemia, unspecified: Secondary | ICD-10-CM

## 2011-04-15 DIAGNOSIS — J449 Chronic obstructive pulmonary disease, unspecified: Secondary | ICD-10-CM

## 2011-04-15 NOTE — Progress Notes (Signed)
  Subjective:    Patient ID: [name], female    DOB: [birth year], 77 y.o.   MRN: [id number]  HPI  Here for follow up - reviewed chronic medical issues:  COPD - end stage, O2 dep - follows regularly with pulmonary and home hospice - the patient reports compliance with medication(s) as prescribed. Denies adverse side effects.  HTN - the patient reports compliance with medication(s) as prescribed. Denies adverse side effects.  Dyslipidemia - on statin - hx CAD -the patient reports compliance with medication(s) as prescribed. Denies adverse side effects.  Hx CVA and CEA, right in 1991 - no ASA due to vW dz - no residual deficits  Past Medical History  Diagnosis Date  . Von Willebrand's disease   . CORONARY ARTERY DISEASE   . DIVERTICULOSIS, COLON   . CEREBROVASCULAR ACCIDENT, HX OF 1991  . SMALL BOWEL OBSTRUCTION, HX OF   . CAROTID ENDARTERECTOMY, HX OF 06/23/1992  . COPD     end stage, O2 dep  . OSTEOPOROSIS   . HYPERTENSION   . GERD   . Depression   . Hyperlipidemia     Family History  Problem Relation Age of Onset  . Arthritis Mother   . Arthritis Father   . Heart disease Father   . Hyperlipidemia Father   . Hypertension Father      Review of Systems  Constitutional: Positive for fatigue. Negative for fever.  Genitourinary: Positive for urgency and enuresis. Negative for difficulty urinating.  Psychiatric/Behavioral: Negative for dysphoric mood. The patient is nervous/anxious.        Objective:   Physical Exam  BP 132/62  Pulse 73  Temp(Src) 98.3 F (36.8 C) (Oral)  Ht 5\' 2"  (1.575 m)  Wt 147 lb 12.8 oz (67.042 kg)  BMI 27.03 kg/m2  SpO2 92%  Constitutional: She appears chronically ill but well-nourished. No distress at rest. spouse at side.  Neck: Normal range of motion. Neck supple. No JVD present. No thyromegaly present.  Cardiovascular: Normal rate, regular rhythm and normal heart sounds.  No murmur heard. no BLE edema Pulmonary/Chest:  No  increase work of breathing at rest, good effort but diminished breath sounds bilaterally. No active respiratory distress. She has no wheezes.  Psychiatric: She has a normal mood and affect, very pleasant. Her behavior is normal. Judgment and thought content normal.   Lab Results  Component Value Date   WBC 4.8 03/13/2011   HGB 11.1* 03/13/2011   HCT 33.5* 03/13/2011   PLT 206.0 03/13/2011   CHOL 232* 03/13/2011   TRIG 294.0* 03/13/2011   HDL 39.50 03/13/2011   LDLDIRECT 115.9 03/13/2011   ALT 19 03/13/2011   AST 23 03/13/2011   NA 140 03/13/2011   K 4.3 03/13/2011   CL 100 03/13/2011   CREATININE 0.9 03/13/2011   BUN 14 03/13/2011   CO2 34* 03/13/2011       Assessment & Plan:  See problem list. Medications and labs reviewed today.

## 2011-04-15 NOTE — Assessment & Plan Note (Signed)
Follows with cards for same - no med changes today due to variable BP BP Readings from Last 3 Encounters:  04/15/11 132/62  03/13/11 140/68  03/04/11 120/80

## 2011-04-15 NOTE — Assessment & Plan Note (Signed)
Advanced O2 dep dz - Follows with pulm and home hospice -  The current medical regimen is effective;  continue present plan and medications. 

## 2011-04-15 NOTE — Patient Instructions (Signed)
It was good to see you today. We have reviewed your prior records including labs and tests today Medications reviewed, no changes at this time - ok to use tylenol as needed for arthritis pain Please schedule followup in 6-12 months, call sooner if problems.

## 2011-04-15 NOTE — Assessment & Plan Note (Signed)
On statin. Follows with cards for same ([facility]) The current medical regimen is effective;  continue present plan and medications.

## 2011-04-22 ENCOUNTER — Ambulatory Visit (INDEPENDENT_AMBULATORY_CARE_PROVIDER_SITE_OTHER): Payer: Medicare Other | Admitting: Critical Care Medicine

## 2011-04-22 ENCOUNTER — Encounter: Payer: Self-pay | Admitting: Critical Care Medicine

## 2011-04-22 DIAGNOSIS — J449 Chronic obstructive pulmonary disease, unspecified: Secondary | ICD-10-CM

## 2011-04-22 NOTE — Progress Notes (Signed)
Subjective:    Patient ID: [name], female    DOB: 07-Oct-1927, 77 y.o.   MRN: [id number]  HPI   This is a 77 y.o.   white female with advanced chronic obstructive lung disease with primary emphysematous component. The patient has chronic abdominal distention from partial small bowel dysfunction, and obstruction. Pt now in [facility]  75/30/2012  F/u copd severe. Dyspnea is worse Esp with any activity. Still with some abdominal pain. Had spell of emesis and blockage.  Has to work with husband with OBS, loses his temper   10/29/10 Note is getting weaker.  Legs give out.  Pt is falling more.  Trying to hold on to a rail and fell. Denies excess mucus.  If takes neb med will cough up mucus.    6/19 Now has spells of coughing.  When takes the brovana at night will cough at night.  Min cough in daytime. Had good respite time in [facility].  Notes bubbling in throat from esophagus.  8/7 Since 6/12, had to take NTG pill for chest pain.  This occurred three weeks ago.  Stomach was full.   ? Pain in abdominal area.  Bowels would not move.  Then bowels moved and felt better.  Pure water out of bowels  Still very dyspneic and tired all the time.  Now pain in upper chest area like had cough paroxysms.    03/04/11 Noting more fatigue and dyspnea.  Weak all over. Was in respite at [facility] place.  Notes some headache.  Heart racing at times. Bowels an issue at hospice.  Had solid bowel movements.    10/30 Notes more nausea, no emesis. Bowels are moving.  Getting weaker. Notes dizzyness and tremors.  Past Medical History  Diagnosis Date  . Von Willebrand's disease   . CORONARY ARTERY DISEASE   . DIVERTICULOSIS, COLON   . CEREBROVASCULAR ACCIDENT, HX OF 1991  . SMALL BOWEL OBSTRUCTION, HX OF   . CAROTID ENDARTERECTOMY, HX OF 06/23/1992  . COPD     end stage, O2 dep  . OSTEOPOROSIS   . HYPERTENSION   . GERD   . Depression   . Hyperlipidemia      Family History  Problem Relation Age of  Onset  . Arthritis Mother   . Arthritis Father   . Heart disease Father   . Hyperlipidemia Father   . Hypertension Father      History   Social History  . Marital Status: Married    Spouse Name: N/A    Number of Children: N/A  . Years of Education: N/A   Occupational History  . Not on file.   Social History Main Topics  . Smoking status: Former Smoker -- 1.5 packs/day for 40 years    Quit date: 06/23/1988  . Smokeless tobacco: Never Used  . Alcohol Use: Yes     2 glass each night  . Drug Use: No  . Sexually Active: Not Currently   Other Topics Concern  . Not on file   Social History Narrative  . No narrative on file     Allergies  Allergen Reactions  . Ciprofloxacin      Outpatient Prescriptions Prior to Visit  Medication Sig Dispense Refill  . acetaminophen (TYLENOL) 325 MG tablet Take 650 mg by mouth every 6 (six) hours as needed.        Marland Kitchen albuterol (PROVENTIL) (2.5 MG/3ML) 0.083% nebulizer solution Use four times daily as needed  75 mL    .  ALPRAZolam (XANAX) 1 MG tablet Take 1 tablet (1 mg total) by mouth 3 (three) times daily as needed.  90 tablet  2  . amLODipine (NORVASC) 5 MG tablet TAKE 1 TABLET BY MOUTH EVERY DAY  30 tablet  12  . arformoterol (BROVANA) 15 MCG/2ML NEBU Take 2 mLs (15 mcg total) by nebulization 2 (two) times daily. DX:  496  120 mL  6  . dextromethorphan (DELSYM) 30 MG/5ML liquid Take 60 mg by mouth as needed.        . furosemide (LASIX) 40 MG tablet Take 40 mg by mouth daily.        Marland Kitchen guaifenesin (ROBITUSSIN) 100 MG/5ML syrup Take 200 mg by mouth as needed.        . hydroxypropyl methylcellulose (ISOPTO TEARS) 2.5 % ophthalmic solution 1 drop as needed.        . isosorbide mononitrate (IMDUR) 60 MG 24 hr tablet Take 1 tablet (60 mg total) by mouth daily.      Marland Kitchen lovastatin (MEVACOR) 40 MG tablet Take 40 mg by mouth daily.        . metoprolol tartrate (LOPRESSOR) 25 MG tablet Take 25 mg by mouth 2 (two) times daily.        . nitroGLYCERIN  (NITROSTAT) 0.4 MG SL tablet Place 0.4 mg under the tongue every 5 (five) minutes as needed.        Marland Kitchen PARoxetine (PAXIL) 10 MG tablet Take 1 tablet (10 mg total) by mouth every morning.  30 tablet  5  . polyethylene glycol powder (GLYCOLAX/MIRALAX) powder USE AS DIRECTED ONCE DAILY  527 g  6  . PROAIR HFA 108 (90 BASE) MCG/ACT inhaler INHALE 1 TO 2 PUFFS EVERY 4 TO 6 HOURS AS NEEDED  1 Inhaler  6  . prochlorperazine (COMPAZINE) 10 MG tablet Take 1 tablet by mouth every 6 hours as needed for nausea and vomiting      . sodium chloride (OCEAN) 0.65 % nasal spray 1 spray by Nasal route as needed.        Marland Kitchen SPIRIVA HANDIHALER 18 MCG inhalation capsule USE ONE CAPSULE IN HANDIHALER AS DIRECTED ONCE DAILY  30 each  6  . valsartan (DIOVAN) 160 MG tablet 1/2 tablet once daily      . esomeprazole (NEXIUM) 40 MG capsule Hold until dexilant trial complete  30 capsule  5  . fluticasone (FLONASE) 50 MCG/ACT nasal spray USE 2 SPRAYS IN BOTH NOSTRILS AT BEDTIME  16 g  0      Review of Systems  Constitutional:   No  weight loss, night sweats,  Fevers, chills, fatigue, lassitude. HEENT:   No headaches,  Difficulty swallowing,  Tooth/dental problems,  Sore throat,                No sneezing, itching, ear ache, nasal congestion, post nasal drip,   CV:  Notes anginal  chest pain,  Orthopnea, PND, swelling in lower extremities, anasarca,  Notes dizziness, no palpitations  GI  No heartburn, indigestion,  Notes abdominal pain   no nausea, vomiting, diarrhea, change in bowel habits, loss of appetite  Resp: Notes  shortness of breath with exertion and  at rest.  No excess mucus, no productive cough,  No non-productive cough,  No coughing up of blood.  No change in color of mucus.  No wheezing.  No chest wall deformity  Skin: no rash or lesions.  GU: no dysuria, change in color of urine, no urgency or frequency.  No flank pain.  MS:  No joint pain or swelling.  No decreased range of motion.  No back  pain.  Psych:  No change in mood or affect. No depression or anxiety.  No memory loss.     Objective:   Physical Exam  Filed Vitals:   04/22/11 1342 04/22/11 1346  BP: 122/58   Pulse: 72   Temp: 98 F (36.7 C)   TempSrc: Oral   Height: 4\' 11"  (1.499 m)   Weight: 147 lb 6.4 oz (66.86 kg)   SpO2: 87% 95%    Gen: Pleasant, well-nourished, in no distress,  normal affect  ENT: No lesions,  mouth clear,  oropharynx clear, no postnasal drip  Neck: No JVD, no TMG, no carotid bruits  Lungs: No use of accessory muscles, no dullness to percussion, distant bs , poor airflow  Cardiovascular: RRR, heart sounds normal, no murmur or gallops, no peripheral edema  Abdomen: soft and NT, no HSM,  BS normal  Musculoskeletal: No deformities, no cyanosis or clubbing  Neuro: alert, non focal  Skin: Warm, no lesions or rashes        Assessment & Plan:   COPD COPD endstage GOLDs stage IV [facility] following The patient is reaching into her disease process and needs continued palliation poor hospice Plan Continue palliative care via hospice and  palliative Care        Updated Medication List Outpatient Encounter Prescriptions as of 04/22/2011  Medication Sig Dispense Refill  . acetaminophen (TYLENOL) 325 MG tablet Take 650 mg by mouth every 6 (six) hours as needed.        Marland Kitchen albuterol (PROVENTIL) (2.5 MG/3ML) 0.083% nebulizer solution Use four times daily as needed  75 mL    . ALPRAZolam (XANAX) 1 MG tablet Take 1 tablet (1 mg total) by mouth 3 (three) times daily as needed.  90 tablet  2  . amLODipine (NORVASC) 5 MG tablet TAKE 1 TABLET BY MOUTH EVERY DAY  30 tablet  12  . arformoterol (BROVANA) 15 MCG/2ML NEBU Take 2 mLs (15 mcg total) by nebulization 2 (two) times daily. DX:  496  120 mL  6  . dextromethorphan (DELSYM) 30 MG/5ML liquid Take 60 mg by mouth as needed.        Marland Kitchen esomeprazole (NEXIUM) 40 MG capsule Take 40 mg by mouth daily before breakfast.        . fluticasone  (FLONASE) 50 MCG/ACT nasal spray        . furosemide (LASIX) 40 MG tablet Take 40 mg by mouth daily.        Marland Kitchen guaifenesin (ROBITUSSIN) 100 MG/5ML syrup Take 200 mg by mouth as needed.        . hydroxypropyl methylcellulose (ISOPTO TEARS) 2.5 % ophthalmic solution 1 drop as needed.        . isosorbide mononitrate (IMDUR) 60 MG 24 hr tablet Take 1 tablet (60 mg total) by mouth daily.      Marland Kitchen lovastatin (MEVACOR) 40 MG tablet Take 40 mg by mouth daily.        . metoprolol tartrate (LOPRESSOR) 25 MG tablet Take 25 mg by mouth 2 (two) times daily.        . nitroGLYCERIN (NITROSTAT) 0.4 MG SL tablet Place 0.4 mg under the tongue every 5 (five) minutes as needed.        Marland Kitchen PARoxetine (PAXIL) 10 MG tablet Take 1 tablet (10 mg total) by mouth every morning.  30 tablet  5  .  polyethylene glycol powder (GLYCOLAX/MIRALAX) powder USE AS DIRECTED ONCE DAILY  527 g  6  . PROAIR HFA 108 (90 BASE) MCG/ACT inhaler INHALE 1 TO 2 PUFFS EVERY 4 TO 6 HOURS AS NEEDED  1 Inhaler  6  . prochlorperazine (COMPAZINE) 10 MG tablet Take 1 tablet by mouth every 6 hours as needed for nausea and vomiting      . sodium chloride (OCEAN) 0.65 % nasal spray 1 spray by Nasal route as needed.        Marland Kitchen SPIRIVA HANDIHALER 18 MCG inhalation capsule USE ONE CAPSULE IN HANDIHALER AS DIRECTED ONCE DAILY  30 each  6  . valsartan (DIOVAN) 160 MG tablet 1/2 tablet once daily      . DISCONTD: esomeprazole (NEXIUM) 40 MG capsule Hold until dexilant trial complete  30 capsule  5  . DISCONTD: fluticasone (FLONASE) 50 MCG/ACT nasal spray USE 2 SPRAYS IN BOTH NOSTRILS AT BEDTIME  16 g  0

## 2011-04-22 NOTE — Patient Instructions (Signed)
No change in medications. Return in        6 weeks  

## 2011-04-23 NOTE — Assessment & Plan Note (Signed)
COPD endstage GOLDs stage IV [facility] following The patient is reaching into her disease process and needs continued palliation poor hospice Plan Continue palliative care via hospice and  palliative Care  

## 2011-04-28 ENCOUNTER — Other Ambulatory Visit: Payer: Self-pay | Admitting: Cardiology

## 2011-04-28 NOTE — Telephone Encounter (Signed)
Refilled lovastatin 

## 2011-06-02 ENCOUNTER — Other Ambulatory Visit: Payer: Self-pay | Admitting: Cardiology

## 2011-06-03 ENCOUNTER — Ambulatory Visit (INDEPENDENT_AMBULATORY_CARE_PROVIDER_SITE_OTHER): Payer: Medicare Other | Admitting: Critical Care Medicine

## 2011-06-03 ENCOUNTER — Other Ambulatory Visit: Payer: Self-pay | Admitting: *Deleted

## 2011-06-03 ENCOUNTER — Encounter: Payer: Self-pay | Admitting: Critical Care Medicine

## 2011-06-03 DIAGNOSIS — J449 Chronic obstructive pulmonary disease, unspecified: Secondary | ICD-10-CM

## 2011-06-03 NOTE — Assessment & Plan Note (Signed)
Progressive respiratory failure, pt entering final stages of disease process Plan [facility] to become more involved May need [facility] admit

## 2011-06-03 NOTE — Progress Notes (Signed)
Subjective:    Patient ID: [name], female    DOB: 1927/07/27, 77 y.o.   MRN: [id number]  HPI   This is a 77 y.o.   white female with advanced chronic obstructive lung disease with primary emphysematous component. The patient has chronic abdominal distention from partial small bowel dysfunction, and obstruction. Pt now in [facility]  09/20/2010  F/u copd severe. Dyspnea is worse Esp with any activity. Still with some abdominal pain. Had spell of emesis and blockage.  Has to work with husband with OBS, loses his temper   10/29/10 Note is getting weaker.  Legs give out.  Pt is falling more.  Trying to hold on to a rail and fell. Denies excess mucus.  If takes neb med will cough up mucus.    6/19 Now has spells of coughing.  When takes the brovana at night will cough at night.  Min cough in daytime. Had good respite time in [facility].  Notes bubbling in throat from esophagus.  8/7 Since 6/12, had to take NTG pill for chest pain.  This occurred three weeks ago.  Stomach was full.   ? Pain in abdominal area.  Bowels would not move.  Then bowels moved and felt better.  Pure water out of bowels  Still very dyspneic and tired all the time.  Now pain in upper chest area like had cough paroxysms.    03/04/11 Noting more fatigue and dyspnea.  Weak all over. Was in respite at [facility] place.  Notes some headache.  Heart racing at times. Bowels an issue at hospice.  Had solid bowel movements.    10/30 Notes more nausea, no emesis. Bowels are moving.  Getting weaker. Notes dizzyness and tremors.  06/03/2011 MUch worse over several days, feels tired and worn out. Pain and aches all over.  No real cough. Notes more dyspnea.  Notes some edema in feet.  Stomach is more distended. No real chest pain.  Ant chest hurts with cough, pain is LL in the back.  Past Medical History  Diagnosis Date  . Von Willebrand's disease   . CORONARY ARTERY DISEASE   . DIVERTICULOSIS, COLON   . CEREBROVASCULAR  ACCIDENT, HX OF 1991  . SMALL BOWEL OBSTRUCTION, HX OF   . CAROTID ENDARTERECTOMY, HX OF 06/23/1992  . COPD     end stage, O2 dep  . OSTEOPOROSIS   . HYPERTENSION   . GERD   . Depression   . Hyperlipidemia      Family History  Problem Relation Age of Onset  . Arthritis Mother   . Arthritis Father   . Heart disease Father   . Hyperlipidemia Father   . Hypertension Father      History   Social History  . Marital Status: Married    Spouse Name: N/A    Number of Children: N/A  . Years of Education: N/A   Occupational History  . Not on file.   Social History Main Topics  . Smoking status: Former Smoker -- 1.5 packs/day for 40 years    Quit date: 06/23/1988  . Smokeless tobacco: Never Used  . Alcohol Use: Yes     2 glass each night  . Drug Use: No  . Sexually Active: Not Currently   Other Topics Concern  . Not on file   Social History Narrative  . No narrative on file     Allergies  Allergen Reactions  . Ciprofloxacin      Outpatient Prescriptions Prior  to Visit  Medication Sig Dispense Refill  . acetaminophen (TYLENOL) 325 MG tablet Take 650 mg by mouth every 6 (six) hours as needed.        Marland Kitchen albuterol (PROVENTIL) (2.5 MG/3ML) 0.083% nebulizer solution Use four times daily as needed  75 mL    . ALPRAZolam (XANAX) 1 MG tablet Take 1 tablet (1 mg total) by mouth 3 (three) times daily as needed.  90 tablet  2  . amLODipine (NORVASC) 5 MG tablet TAKE 1 TABLET BY MOUTH EVERY DAY  30 tablet  12  . arformoterol (BROVANA) 15 MCG/2ML NEBU Take 2 mLs (15 mcg total) by nebulization 2 (two) times daily. DX:  496  120 mL  6  . dextromethorphan (DELSYM) 30 MG/5ML liquid Take 60 mg by mouth as needed.        Marland Kitchen esomeprazole (NEXIUM) 40 MG capsule Take 40 mg by mouth daily before breakfast.        . fluticasone (FLONASE) 50 MCG/ACT nasal spray Place 2 sprays into the nose daily.       . furosemide (LASIX) 40 MG tablet TAKE 1 TABLET EVERY DAY  30 tablet  11  . guaifenesin  (ROBITUSSIN) 100 MG/5ML syrup Take 200 mg by mouth as needed.        . hydroxypropyl methylcellulose (ISOPTO TEARS) 2.5 % ophthalmic solution 1 drop as needed.        . isosorbide mononitrate (IMDUR) 60 MG 24 hr tablet Take 1 tablet (60 mg total) by mouth daily.      Marland Kitchen lovastatin (MEVACOR) 40 MG tablet TAKE 1 TABLET BY MOUTH EVERY DAY  90 tablet  PRN  . metoprolol tartrate (LOPRESSOR) 25 MG tablet Take 25 mg by mouth 2 (two) times daily.        . nitroGLYCERIN (NITROSTAT) 0.4 MG SL tablet Place 0.4 mg under the tongue every 5 (five) minutes as needed.        Marland Kitchen PARoxetine (PAXIL) 10 MG tablet Take 1 tablet (10 mg total) by mouth every morning.  30 tablet  5  . polyethylene glycol powder (GLYCOLAX/MIRALAX) powder USE AS DIRECTED ONCE DAILY  527 g  6  . PROAIR HFA 108 (90 BASE) MCG/ACT inhaler INHALE 1 TO 2 PUFFS EVERY 4 TO 6 HOURS AS NEEDED  1 Inhaler  6  . prochlorperazine (COMPAZINE) 10 MG tablet Take 1 tablet by mouth every 6 hours as needed for nausea and vomiting      . SPIRIVA HANDIHALER 18 MCG inhalation capsule USE ONE CAPSULE IN HANDIHALER AS DIRECTED ONCE DAILY  30 each  6  . valsartan (DIOVAN) 160 MG tablet 1/2 tablet once daily      . sodium chloride (OCEAN) 0.65 % nasal spray 1 spray by Nasal route as needed.            Review of Systems  Constitutional:   No  weight loss, night sweats,  Fevers, chills, fatigue, lassitude. HEENT:   No headaches,  Difficulty swallowing,  Tooth/dental problems,  Sore throat,                No sneezing, itching, ear ache, nasal congestion, post nasal drip,   CV:  Notes anginal  chest pain,  Orthopnea, PND, swelling in lower extremities, anasarca,  Notes dizziness, no palpitations  GI  No heartburn, indigestion,  Notes abdominal pain   no nausea, vomiting, diarrhea, change in bowel habits, loss of appetite  Resp: Notes  shortness of breath with exertion and  at rest.  No excess mucus, no productive cough,  No non-productive cough,  No coughing up of  blood.  No change in color of mucus.  No wheezing.  No chest wall deformity  Skin: no rash or lesions.  GU: no dysuria, change in color of urine, no urgency or frequency.  No flank pain.  MS:  No joint pain or swelling.  No decreased range of motion.  No back pain.  Psych:  No change in mood or affect. No depression or anxiety.  No memory loss.     Objective:   Physical Exam  Filed Vitals:   06/03/11 1412 06/03/11 1420  BP: 120/70   Pulse: 65   Temp: 98 F (36.7 C)   TempSrc: Oral   Height: 4\' 11"  (1.499 m)   Weight: 148 lb 3.2 oz (67.223 kg)   SpO2: 87% 90%    Gen: depressed WF, flat affect  ENT: No lesions,  mouth clear,  oropharynx clear, no postnasal drip  Neck: No JVD, no TMG, no carotid bruits  Lungs: No use of accessory muscles, no dullness to percussion, distant bs , poor airflow  Cardiovascular: RRR, heart sounds normal, no murmur or gallops, no peripheral edema  Abdomen: soft and NT, no HSM,  BS normal  Musculoskeletal: No deformities, no cyanosis or clubbing  Neuro: alert, non focal  Skin: Warm, no lesions or rashes        Assessment & Plan:   COPD Progressive respiratory failure, pt entering final stages of disease process Plan [facility] to become more involved May need [facility] admit     Updated Medication List Outpatient Encounter Prescriptions as of 06/03/2011  Medication Sig Dispense Refill  . acetaminophen (TYLENOL) 325 MG tablet Take 650 mg by mouth every 6 (six) hours as needed.        Marland Kitchen albuterol (PROVENTIL) (2.5 MG/3ML) 0.083% nebulizer solution Use four times daily as needed  75 mL    . ALPRAZolam (XANAX) 1 MG tablet Take 1 tablet (1 mg total) by mouth 3 (three) times daily as needed.  90 tablet  2  . amLODipine (NORVASC) 5 MG tablet TAKE 1 TABLET BY MOUTH EVERY DAY  30 tablet  12  . arformoterol (BROVANA) 15 MCG/2ML NEBU Take 2 mLs (15 mcg total) by nebulization 2 (two) times daily. DX:  496  120 mL  6  . dextromethorphan  (DELSYM) 30 MG/5ML liquid Take 60 mg by mouth as needed.        Marland Kitchen esomeprazole (NEXIUM) 40 MG capsule Take 40 mg by mouth daily before breakfast.        . fluticasone (FLONASE) 50 MCG/ACT nasal spray Place 2 sprays into the nose daily.       . furosemide (LASIX) 40 MG tablet TAKE 1 TABLET EVERY DAY  30 tablet  11  . guaifenesin (ROBITUSSIN) 100 MG/5ML syrup Take 200 mg by mouth as needed.        . hydroxypropyl methylcellulose (ISOPTO TEARS) 2.5 % ophthalmic solution 1 drop as needed.        . isosorbide mononitrate (IMDUR) 60 MG 24 hr tablet Take 1 tablet (60 mg total) by mouth daily.      Marland Kitchen lovastatin (MEVACOR) 40 MG tablet TAKE 1 TABLET BY MOUTH EVERY DAY  90 tablet  PRN  . metoprolol tartrate (LOPRESSOR) 25 MG tablet Take 25 mg by mouth 2 (two) times daily.        . nitroGLYCERIN (NITROSTAT) 0.4 MG SL tablet Place  0.4 mg under the tongue every 5 (five) minutes as needed.        Marland Kitchen PARoxetine (PAXIL) 10 MG tablet Take 1 tablet (10 mg total) by mouth every morning.  30 tablet  5  . polyethylene glycol powder (GLYCOLAX/MIRALAX) powder USE AS DIRECTED ONCE DAILY  527 g  6  . PROAIR HFA 108 (90 BASE) MCG/ACT inhaler INHALE 1 TO 2 PUFFS EVERY 4 TO 6 HOURS AS NEEDED  1 Inhaler  6  . prochlorperazine (COMPAZINE) 10 MG tablet Take 1 tablet by mouth every 6 hours as needed for nausea and vomiting      . SPIRIVA HANDIHALER 18 MCG inhalation capsule USE ONE CAPSULE IN HANDIHALER AS DIRECTED ONCE DAILY  30 each  6  . valsartan (DIOVAN) 160 MG tablet 1/2 tablet once daily      . DISCONTD: sodium chloride (OCEAN) 0.65 % nasal spray 1 spray by Nasal route as needed.

## 2011-06-03 NOTE — Patient Instructions (Signed)
We will try to get you into [facility] No change in medications

## 2011-06-20 ENCOUNTER — Other Ambulatory Visit: Payer: Self-pay | Admitting: *Deleted

## 2011-06-20 MED ORDER — ALPRAZOLAM 1 MG PO TABS: 1.0000 mg | ORAL_TABLET | Freq: Three times a day (TID) | ORAL | Status: DC | PRN

## 2011-06-20 NOTE — Telephone Encounter (Signed)
RX sent

## 2011-07-03 ENCOUNTER — Other Ambulatory Visit: Payer: Self-pay | Admitting: Critical Care Medicine

## 2011-07-10 ENCOUNTER — Telehealth: Payer: Self-pay | Admitting: Critical Care Medicine

## 2011-07-10 ENCOUNTER — Encounter: Payer: Self-pay | Admitting: *Deleted

## 2011-07-10 MED ORDER — ALBUTEROL SULFATE (2.5 MG/3ML) 0.083% IN NEBU: INHALATION_SOLUTION | RESPIRATORY_TRACT | Status: DC

## 2011-07-10 NOTE — Telephone Encounter (Signed)
Spoke with Victorino Dike with Hospice and informed that rx for Albuterol was sent to [facility] [address].

## 2011-07-11 ENCOUNTER — Telehealth: Payer: Self-pay | Admitting: Critical Care Medicine

## 2011-07-11 NOTE — Telephone Encounter (Signed)
I spoke with pt and she wanted to know if Dr. Delford Field was her primary "lung doctor" no pcp. I advised her yes that was correct. Pt was very happy to hear that and needed nothing further

## 2011-07-15 ENCOUNTER — Ambulatory Visit (INDEPENDENT_AMBULATORY_CARE_PROVIDER_SITE_OTHER): Payer: Medicare Other | Admitting: Cardiology

## 2011-07-15 ENCOUNTER — Other Ambulatory Visit: Payer: Medicare Other | Admitting: *Deleted

## 2011-07-15 ENCOUNTER — Encounter: Payer: Self-pay | Admitting: Cardiology

## 2011-07-15 DIAGNOSIS — J4489 Other specified chronic obstructive pulmonary disease: Secondary | ICD-10-CM

## 2011-07-15 DIAGNOSIS — J449 Chronic obstructive pulmonary disease, unspecified: Secondary | ICD-10-CM

## 2011-07-15 DIAGNOSIS — I2510 Atherosclerotic heart disease of native coronary artery without angina pectoris: Secondary | ICD-10-CM

## 2011-07-15 DIAGNOSIS — R079 Chest pain, unspecified: Secondary | ICD-10-CM

## 2011-07-15 DIAGNOSIS — I119 Hypertensive heart disease without heart failure: Secondary | ICD-10-CM

## 2011-07-15 DIAGNOSIS — E785 Hyperlipidemia, unspecified: Secondary | ICD-10-CM

## 2011-07-15 DIAGNOSIS — E7800 Pure hypercholesterolemia, unspecified: Secondary | ICD-10-CM

## 2011-07-15 LAB — LIPID PANEL
Cholesterol: 216 mg/dL — ABNORMAL HIGH (ref 0–200)
VLDL: 37.6 mg/dL (ref 0.0–40.0)

## 2011-07-15 LAB — BASIC METABOLIC PANEL
BUN: 12 mg/dL (ref 6–23)
CO2: 33 mEq/L — ABNORMAL HIGH (ref 19–32)
Calcium: 9.2 mg/dL (ref 8.4–10.5)
Creatinine, Ser: 0.9 mg/dL (ref 0.4–1.2)
Glucose, Bld: 102 mg/dL — ABNORMAL HIGH (ref 70–99)

## 2011-07-15 LAB — HEPATIC FUNCTION PANEL
Albumin: 4 g/dL (ref 3.5–5.2)
Total Protein: 7.3 g/dL (ref 6.0–8.3)

## 2011-07-15 MED ORDER — ISOSORBIDE MONONITRATE ER 60 MG PO TB24: 60.0000 mg | ORAL_TABLET | Freq: Every day | ORAL | Status: DC

## 2011-07-15 NOTE — Assessment & Plan Note (Signed)
The patient has a past history of hypercholesterolemia and is on lovastatin.  She's not having any myalgias.

## 2011-07-15 NOTE — Progress Notes (Signed)
[name] Date of Birth:  Nov 11, 1927 [facility] [address] [place], [place]  [address] [phone]         Fax   [phone]  History of Present Illness: This pleasant 78 year old woman is seen for a scheduled four-month followup office visit.  She remains under hospice care.  Over new years she spent about 5 days at [facility] plays with respite care to allow her husband to go down to his beach house. The patient has a history of severe COPD and asthma as well as known ischemic heart disease.  About 3 weeks ago she had severe chest pain at 4:00 in the morning which awakened her.  It responded after she took a fourth nitroglycerin.  She does have oral morphine at home but did not think to try to take it at that time.  He has not had any significant recurrence of severe pain since then  Current Outpatient Prescriptions  Medication Sig Dispense Refill  . acetaminophen (TYLENOL) 325 MG tablet Take 650 mg by mouth every 6 (six) hours as needed.        Marland Kitchen albuterol (PROVENTIL) (2.5 MG/3ML) 0.083% nebulizer solution Use four times daily as needed  120 vial  6  . ALPRAZolam (XANAX) 1 MG tablet Take 1 tablet (1 mg total) by mouth 3 (three) times daily as needed.  90 tablet  3  . amLODipine (NORVASC) 5 MG tablet TAKE 1 TABLET BY MOUTH EVERY DAY  30 tablet  12  . arformoterol (BROVANA) 15 MCG/2ML NEBU Take 2 mLs (15 mcg total) by nebulization 2 (two) times daily. DX:  496  120 mL  6  . dextromethorphan (DELSYM) 30 MG/5ML liquid Take 60 mg by mouth as needed.        Marland Kitchen esomeprazole (NEXIUM) 40 MG capsule Take 40 mg by mouth daily before breakfast.        . fluticasone (FLONASE) 50 MCG/ACT nasal spray Place 2 sprays into the nose daily.       . furosemide (LASIX) 40 MG tablet TAKE 1 TABLET EVERY DAY  30 tablet  11  . guaifenesin (ROBITUSSIN) 100 MG/5ML syrup Take 200 mg by mouth as needed.        . hydroxypropyl methylcellulose (ISOPTO TEARS) 2.5 % ophthalmic solution 1 drop  as needed.        . isosorbide mononitrate (IMDUR) 60 MG 24 hr tablet Take 1 tablet (60 mg total) by mouth daily.  30 tablet  11  . lovastatin (MEVACOR) 40 MG tablet TAKE 1 TABLET BY MOUTH EVERY DAY  90 tablet  PRN  . metoprolol tartrate (LOPRESSOR) 25 MG tablet Take 25 mg by mouth 2 (two) times daily.        . nitroGLYCERIN (NITROSTAT) 0.4 MG SL tablet Place 0.4 mg under the tongue every 5 (five) minutes as needed.        Marland Kitchen PARoxetine (PAXIL) 10 MG tablet Take 1 tablet (10 mg total) by mouth every morning.  30 tablet  5  . polyethylene glycol powder (GLYCOLAX/MIRALAX) powder USE AS DIRECTED ONCE DAILY  527 g  6  . PROAIR HFA 108 (90 BASE) MCG/ACT inhaler INHALE 1 TO 2 PUFFS EVERY 4 TO 6 HOURS AS NEEDED  1 Inhaler  6  . prochlorperazine (COMPAZINE) 10 MG tablet Take 1 tablet by mouth every 6 hours as needed for nausea and vomiting      . SPIRIVA HANDIHALER 18 MCG inhalation capsule USE ONE CAPSULE IN HANDIHALER AS  DIRECTED ONCE DAILY  30 each  3  . valsartan (DIOVAN) 160 MG tablet 1/2 tablet once daily      . DISCONTD: isosorbide mononitrate (IMDUR) 60 MG 24 hr tablet Take 1 tablet (60 mg total) by mouth daily.        Allergies  Allergen Reactions  . Ciprofloxacin   . Hctz (Hydrochlorothiazide)     rash  . Klotrix (Potassium Chloride)   . Other     Powder on gloves,avoids ASA re-prolonged PTT  . Pravachol     LFT  . Sulfa Antibiotics   . Tetracyclines & Related     Vaginal rash    Patient Active Problem List  Diagnoses  . VON [facility] DISEASE  . ADJUSTMENT DISORDER WITH ANXIETY  . HYPERTENSION  . CORONARY ARTERY DISEASE  . COPD  . GERD  . DIVERTICULOSIS, COLON  . INFECTION, URINARY TRACT NOS  . OSTEOPOROSIS  . CEREBROVASCULAR ACCIDENT, HX OF  . SMALL BOWEL OBSTRUCTION, HX OF  . CAROTID ENDARTERECTOMY, HX OF  . Dyshidrotic eczema  . Vaginitis  . Hyperlipidemia    History  Smoking status  . Former Smoker -- 1.5 packs/day for 40 years  . Quit date: 06/23/1988    Smokeless tobacco  . Never Used    History  Alcohol Use  . Yes    2 glass each night    Family History  Problem Relation Age of Onset  . Arthritis Mother   . Arthritis Father   . Heart disease Father   . Hyperlipidemia Father   . Hypertension Father     Review of Systems: Constitutional: no fever chills diaphoresis or fatigue or change in weight.  Head and neck: no hearing loss, no epistaxis, no photophobia or visual disturbance. Respiratory: No cough, shortness of breath or wheezing. Cardiovascular: No chest pain peripheral edema, palpitations. Gastrointestinal: No abdominal distention, no abdominal pain, no change in bowel habits hematochezia or melena. Genitourinary: No dysuria, no frequency, no urgency, no nocturia. Musculoskeletal:No arthralgias, no back pain, no gait disturbance or myalgias. Neurological: No dizziness, no headaches, no numbness, no seizures, no syncope, no weakness, no tremors. Hematologic: No lymphadenopathy, no easy bruising. Psychiatric: No confusion, no hallucinations, no sleep disturbance.    Physical Exam: Filed Vitals:   07/15/11 1129  BP: 140/76  Pulse: 61   General appearance reveals a elderly woman who is alert mentally.  She is in a wheelchair and she has round-the-clock nasal oxygen in place.Pupils equal and reactive.   Extraocular Movements are full.  There is no scleral icterus.  The mouth and pharynx are normal.  The neck is supple.  The carotids reveal no bruits.  The jugular venous pressure is normal.  The thyroid is not enlarged.  There is no lymphadenopathy.  Chest reveals mild expiratory wheezesThe precordium is quiet.  The first heart sound is normal.  The second heart sound is physiologically split.  There is no murmur gallop rub or click.  There is no abnormal lift or heave.  The abdomen is soft and nontender. Bowel sounds are normal. The liver and spleen are not enlarged. There Are no abdominal masses. There are no  bruits.  The pedal pulses are good.  There is no phlebitis or edema.  There is no cyanosis or clubbing. Strength is normal and symmetrical in all extremities.  There is no lateralizing weakness.  There are no sensory deficits.  The skin is warm and dry.  There is no rash.   Assessment / Plan:  Patient is to continue on same medication.  I refilled her isosorbide mononitrate today.  She'll return in 4 months for followup office visit and fasting lab work.

## 2011-07-15 NOTE — Assessment & Plan Note (Signed)
The patient has known ischemic heart disease.  She will continue on same medication and she does have oral morphine on hand if she needs it for chest pain not responding to sublingual nitroglycerin.

## 2011-07-15 NOTE — Assessment & Plan Note (Signed)
The patient is now also on low-dose daily prednisone 5 mg a day given to her by Dr. hurt weight.  She feels that it has helped her breathing overall.  She is not coughing up any sputum and she is not having any fever or chills at this point.

## 2011-07-15 NOTE — Patient Instructions (Signed)
Will obtain labs today and call you with the results  Your physician wants you to follow-up in: 4 months You will receive a reminder letter in the mail two months in advance. If you don't receive a letter, please call our office to schedule the follow-up appointment.  Your physician recommends that you continue on your current medications as directed. Please refer to the Current Medication list given to you today.  

## 2011-07-17 ENCOUNTER — Telehealth: Payer: Self-pay | Admitting: Cardiology

## 2011-07-17 NOTE — Telephone Encounter (Signed)
New Problem:     Patient called in to correct the dosage on her Predmisone that she told Dr. Patty Sermons she was taking. She is taking 10MG  per dose. She told him 5MG  when she saw him last. Please call back if you have any questions.

## 2011-07-18 ENCOUNTER — Telehealth: Payer: Self-pay | Admitting: *Deleted

## 2011-07-18 NOTE — Telephone Encounter (Signed)
Will forward to  Dr. Patty Sermons for review-information only

## 2011-07-18 NOTE — Telephone Encounter (Signed)
Message copied by Burnell Blanks on Fri Jul 18, 2011  9:03 AM ------      Message from: Cassell Clement      Created: Thu Jul 17, 2011  2:53 PM       Please report.  The labs are stable.  Continue same meds.  Continue careful diet.

## 2011-07-18 NOTE — Telephone Encounter (Signed)
Advised and mailed copy  

## 2011-07-18 NOTE — Telephone Encounter (Signed)
Noted. TB

## 2011-07-24 ENCOUNTER — Other Ambulatory Visit: Payer: Self-pay | Admitting: Critical Care Medicine

## 2011-07-31 ENCOUNTER — Other Ambulatory Visit: Payer: Self-pay | Admitting: Critical Care Medicine

## 2011-08-07 ENCOUNTER — Other Ambulatory Visit: Payer: Self-pay | Admitting: Critical Care Medicine

## 2011-09-04 ENCOUNTER — Other Ambulatory Visit: Payer: Self-pay | Admitting: Critical Care Medicine

## 2011-09-25 ENCOUNTER — Other Ambulatory Visit: Payer: Self-pay | Admitting: Cardiology

## 2011-10-02 ENCOUNTER — Other Ambulatory Visit: Payer: Self-pay | Admitting: Critical Care Medicine

## 2011-10-02 ENCOUNTER — Other Ambulatory Visit: Payer: Self-pay | Admitting: Cardiology

## 2011-10-02 NOTE — Telephone Encounter (Signed)
Refilled amlodipine 

## 2011-10-14 ENCOUNTER — Ambulatory Visit: Payer: Medicare Other | Admitting: Internal Medicine

## 2011-10-15 ENCOUNTER — Ambulatory Visit (INDEPENDENT_AMBULATORY_CARE_PROVIDER_SITE_OTHER): Payer: Medicare Other | Admitting: Internal Medicine

## 2011-10-15 ENCOUNTER — Encounter: Payer: Self-pay | Admitting: Internal Medicine

## 2011-10-15 VITALS — BP 108/58 | HR 74 | Temp 98.3°F | Resp 16

## 2011-10-15 DIAGNOSIS — J449 Chronic obstructive pulmonary disease, unspecified: Secondary | ICD-10-CM

## 2011-10-15 DIAGNOSIS — J4489 Other specified chronic obstructive pulmonary disease: Secondary | ICD-10-CM

## 2011-10-15 DIAGNOSIS — I10 Essential (primary) hypertension: Secondary | ICD-10-CM

## 2011-10-15 DIAGNOSIS — R5383 Other fatigue: Secondary | ICD-10-CM

## 2011-10-15 DIAGNOSIS — R5381 Other malaise: Secondary | ICD-10-CM

## 2011-10-15 NOTE — Progress Notes (Signed)
  Subjective:    Patient ID: [name], female    DOB: 02-Apr-1928, 78 y.o.   MRN: [id number]  HPI  Here for follow up - reviewed chronic medical issues:  COPD - end stage, O2 dep - follows regularly with pulmonary and home hospice - the patient reports compliance with medication(s) as prescribed. Denies adverse side effects.  HTN - the patient reports compliance with medication(s) as prescribed. Denies adverse side effects.  Dyslipidemia - on statin - hx CAD -the patient reports compliance with medication(s) as prescribed. Denies adverse side effects.  Hx CVA and CEA, right in 1991 - no ASA due to vW dz - no residual deficits  Past Medical History  Diagnosis Date  . Von Willebrand's disease   . CORONARY ARTERY DISEASE   . DIVERTICULOSIS, COLON   . CEREBROVASCULAR ACCIDENT, HX OF 1991  . SMALL BOWEL OBSTRUCTION, HX OF   . CAROTID ENDARTERECTOMY, HX OF 06/23/1992  . COPD     end stage, O2 dep  . OSTEOPOROSIS   . HYPERTENSION   . GERD   . Depression   . Hyperlipidemia      Review of Systems  Constitutional: Positive for fatigue. Negative for fever.  Psychiatric/Behavioral: Negative for dysphoric mood. The patient is nervous/anxious.        Objective:   Physical Exam  BP 108/58  Pulse 74  Temp(Src) 98.3 F (36.8 C) (Oral)  Resp 16  SpO2 90% Wt Readings from Last 3 Encounters:  07/15/11 146 lb (66.225 kg)  06/03/11 148 lb 3.2 oz (67.223 kg)  04/22/11 147 lb 6.4 oz (66.86 kg)   Constitutional: She appears chronically ill but well-nourished. No distress at rest.   Neck: Normal range of motion. Neck supple. No JVD present. No thyromegaly present.  Cardiovascular: Normal rate, regular rhythm and normal heart sounds.  No murmur heard. no BLE edema Pulmonary/Chest:  No increase work of breathing at rest, good effort but diminished breath sounds bilaterally. No active respiratory distress. She has no wheezes.  Psychiatric: She has a normal mood and affect, very  pleasant. Her behavior is normal. Judgment and thought content normal.   Lab Results  Component Value Date   WBC 4.8 03/13/2011   HGB 11.1* 03/13/2011   HCT 33.5* 03/13/2011   PLT 206.0 03/13/2011   GLUCOSE 102* 07/15/2011   CHOL 216* 07/15/2011   TRIG 188.0* 07/15/2011   HDL 50.90 07/15/2011   LDLDIRECT 132.7 07/15/2011   ALT 18 07/15/2011   AST 20 07/15/2011   NA 141 07/15/2011   K 3.9 07/15/2011   CL 99 07/15/2011   CREATININE 0.9 07/15/2011   BUN 12 07/15/2011   CO2 33* 07/15/2011       Assessment & Plan:  See problem list. Medications and labs reviewed today.  Fatigue - suspect multifactorial and largely related to end stage COPD -  Check TSH - other recent labs reviewed and ok

## 2011-10-15 NOTE — Assessment & Plan Note (Signed)
Follows with cards for same - no med changes today due to variable BP BP Readings from Last 3 Encounters:  10/15/11 108/58  07/15/11 140/76  06/03/11 120/70

## 2011-10-15 NOTE — Assessment & Plan Note (Signed)
Advanced O2 dep dz - Follows with pulm and home hospice -  The current medical regimen is effective;  continue present plan and medications. 

## 2011-10-15 NOTE — Patient Instructions (Addendum)
It was good to see you today. We have reviewed your interval records including labs and tests today Medications reviewed, updated at this time -  Test(s) ordered today - ask for this to be done with your cardiology labs next month 10/2011. Your results will be called to you after review (48-72hours after test completion). If any changes need to be made, you will be notified at that time. we'll make referral to see Dr Delford Field . Our office will contact you regarding appointment(s) once made. Please schedule followup in 6-12 months, call sooner if problems.

## 2011-10-17 ENCOUNTER — Ambulatory Visit: Payer: Medicare Other | Admitting: Adult Health

## 2011-11-10 ENCOUNTER — Telehealth: Payer: Self-pay | Admitting: Critical Care Medicine

## 2011-11-10 NOTE — Telephone Encounter (Signed)
Last rx written on 06-20-11 #90 with 3 refills. [facility] rx [facility]. Last refill on xanax was given on 10-02-11 #90 and the pt had 1 refill left for #90. I atc [facility] to give this info and line was busy. WCB. Carron Curie, CMA  Pharmacy aware. Carron Curie, CMA

## 2011-11-20 ENCOUNTER — Other Ambulatory Visit (INDEPENDENT_AMBULATORY_CARE_PROVIDER_SITE_OTHER): Payer: Medicare Other

## 2011-11-20 ENCOUNTER — Ambulatory Visit (INDEPENDENT_AMBULATORY_CARE_PROVIDER_SITE_OTHER): Payer: Medicare Other | Admitting: Cardiology

## 2011-11-20 ENCOUNTER — Encounter: Payer: Self-pay | Admitting: Cardiology

## 2011-11-20 VITALS — BP 116/54 | HR 71 | Ht 59.0 in | Wt 149.0 lb

## 2011-11-20 DIAGNOSIS — J449 Chronic obstructive pulmonary disease, unspecified: Secondary | ICD-10-CM

## 2011-11-20 DIAGNOSIS — R5381 Other malaise: Secondary | ICD-10-CM

## 2011-11-20 DIAGNOSIS — I119 Hypertensive heart disease without heart failure: Secondary | ICD-10-CM

## 2011-11-20 DIAGNOSIS — R5383 Other fatigue: Secondary | ICD-10-CM

## 2011-11-20 DIAGNOSIS — I2510 Atherosclerotic heart disease of native coronary artery without angina pectoris: Secondary | ICD-10-CM

## 2011-11-20 DIAGNOSIS — D6800 Von Willebrand disease, unspecified: Secondary | ICD-10-CM

## 2011-11-20 DIAGNOSIS — E7800 Pure hypercholesterolemia, unspecified: Secondary | ICD-10-CM

## 2011-11-20 DIAGNOSIS — J4489 Other specified chronic obstructive pulmonary disease: Secondary | ICD-10-CM

## 2011-11-20 LAB — LIPID PANEL
Cholesterol: 215 mg/dL — ABNORMAL HIGH (ref 0–200)
Triglycerides: 254 mg/dL — ABNORMAL HIGH (ref 0.0–149.0)

## 2011-11-20 LAB — BASIC METABOLIC PANEL
BUN: 15 mg/dL (ref 6–23)
CO2: 36 mEq/L — ABNORMAL HIGH (ref 19–32)
Chloride: 99 mEq/L (ref 96–112)
Creatinine, Ser: 0.9 mg/dL (ref 0.4–1.2)

## 2011-11-20 LAB — HEPATIC FUNCTION PANEL
ALT: 22 U/L (ref 0–35)
Albumin: 4 g/dL (ref 3.5–5.2)
Total Bilirubin: 0.5 mg/dL (ref 0.3–1.2)
Total Protein: 6.7 g/dL (ref 6.0–8.3)

## 2011-11-20 NOTE — Assessment & Plan Note (Signed)
The patient has not had any recent problems with hemorrhage or ecchymosis

## 2011-11-20 NOTE — Assessment & Plan Note (Signed)
The patient has severe COPD.  And is on round-the-clock oxygen.  She is with hospice care.

## 2011-11-20 NOTE — Assessment & Plan Note (Signed)
The patient has frequent episodes of severe chest pain.  Her chest pain is precordial.  She takes nitroglycerin but frequently has to take morphine relief of pain.

## 2011-11-20 NOTE — Progress Notes (Signed)
Quick Note:  Please report to patient. The recent labs are stable. Continue same medication and careful diet. ______ 

## 2011-11-20 NOTE — Progress Notes (Signed)
[name] Date of Birth:  05/07/1928 [facility] [address] [place], [place]  [address] [phone]         Fax   [phone]  History of Present Illness: This pleasant 78 year old woman is seen for a four-month followup office visit.  She has severe COPD and is on continuous oxygen.  She is on hospice program.  She has a history of ischemic heart disease.  Has a history of von Willebrand's disease and a history of hypercholesterolemia and essential hypertension.  Current Outpatient Prescriptions  Medication Sig Dispense Refill  . acetaminophen (TYLENOL) 325 MG tablet Take 650 mg by mouth every 6 (six) hours as needed.        Marland Kitchen albuterol (PROVENTIL) (2.5 MG/3ML) 0.083% nebulizer solution Use four times daily as needed  120 vial  6  . ALPRAZolam (XANAX) 1 MG tablet Take 1 tablet (1 mg total) by mouth 3 (three) times daily as needed.  90 tablet  3  . amLODipine (NORVASC) 5 MG tablet TAKE 1 TABLET BY MOUTH EVERY DAY  30 tablet  11  . arformoterol (BROVANA) 15 MCG/2ML NEBU Take 2 mLs (15 mcg total) by nebulization 2 (two) times daily. DX:  496  120 mL  6  . dextromethorphan (DELSYM) 30 MG/5ML liquid Take 60 mg by mouth as needed.        Marland Kitchen esomeprazole (NEXIUM) 40 MG capsule Take 40 mg by mouth daily before breakfast.        . fluticasone (FLONASE) 50 MCG/ACT nasal spray USE 2 SPRAYS IN BOTH NOSTRILS AT BEDTIME  16 g  0  . furosemide (LASIX) 40 MG tablet TAKE 1 TABLET EVERY DAY  30 tablet  11  . guaifenesin (ROBITUSSIN) 100 MG/5ML syrup Take 200 mg by mouth as needed.        . hydroxypropyl methylcellulose (ISOPTO TEARS) 2.5 % ophthalmic solution 1 drop as needed.        . isosorbide mononitrate (IMDUR) 60 MG 24 hr tablet Take 1 tablet (60 mg total) by mouth daily.  30 tablet  11  . lovastatin (MEVACOR) 40 MG tablet TAKE 1 TABLET BY MOUTH EVERY DAY  90 tablet  PRN  . metoprolol tartrate (LOPRESSOR) 25 MG tablet TAKE 2 TABLETS BY MOUTH EVERY DAY  180 tablet   PRN  . morphine 20 MG/5ML solution Take 0.6 mLs (2.4 mg total) by mouth every 2 (two) hours as needed for pain (shortness of breath).    0  . nitroGLYCERIN (NITROSTAT) 0.4 MG SL tablet Place 0.4 mg under the tongue every 5 (five) minutes as needed.        Marland Kitchen PARoxetine (PAXIL) 10 MG tablet TAKE 1 TABLET (10 MG TOTAL) BY MOUTH EVERY MORNING.  30 tablet  3  . polyethylene glycol powder (GLYCOLAX/MIRALAX) powder USE AS DIRECTED ONCE DAILY  527 g  3  . predniSONE (DELTASONE) 10 MG tablet Take 10 mg by mouth daily.      Marland Kitchen PROAIR HFA 108 (90 BASE) MCG/ACT inhaler INHALE 1 TO 2 PUFFS EVERY 4 TO 6 HOURS AS NEEDED  1 Inhaler  6  . prochlorperazine (COMPAZINE) 10 MG tablet Take 1 tablet by mouth every 6 hours as needed for nausea and vomiting      . SPIRIVA HANDIHALER 18 MCG inhalation capsule USE ONE CAPSULE IN HANDIHALER AS DIRECTED ONCE DAILY  30 each  6  . valsartan (DIOVAN) 160 MG tablet 1/2 tablet once daily  Allergies  Allergen Reactions  . Ciprofloxacin   . Hctz (Hydrochlorothiazide)     rash  . Klotrix (Potassium Chloride)   . Other     Powder on gloves,avoids ASA re-prolonged PTT  . Pravachol     LFT  . Sulfa Antibiotics   . Tetracyclines & Related     Vaginal rash    Patient Active Problem List  Diagnoses  . VON [facility] DISEASE  . ADJUSTMENT DISORDER WITH ANXIETY  . HYPERTENSION  . CORONARY ARTERY DISEASE  . COPD  . GERD  . DIVERTICULOSIS, COLON  . INFECTION, URINARY TRACT NOS  . OSTEOPOROSIS  . CEREBROVASCULAR ACCIDENT, HX OF  . SMALL BOWEL OBSTRUCTION, HX OF  . CAROTID ENDARTERECTOMY, HX OF  . Dyshidrotic eczema  . Vaginitis  . Hyperlipidemia    History  Smoking status  . Former Smoker -- 1.5 packs/day for 40 years  . Quit date: 06/23/1988  Smokeless tobacco  . Never Used    History  Alcohol Use  . Yes    2 glass each night    Family History  Problem Relation Age of Onset  . Arthritis Mother   . Arthritis Father   . Heart disease Father     . Hyperlipidemia Father   . Hypertension Father     Review of Systems: Constitutional: no fever chills diaphoresis or fatigue or change in weight.  Head and neck: no hearing loss, no epistaxis, no photophobia or visual disturbance. Respiratory: No cough, shortness of breath or wheezing. Cardiovascular: No chest pain peripheral edema, palpitations. Gastrointestinal: No abdominal distention, no abdominal pain, no change in bowel habits hematochezia or melena. Genitourinary: No dysuria, no frequency, no urgency, no nocturia. Musculoskeletal:No arthralgias, no back pain, no gait disturbance or myalgias. Neurological: No dizziness, no headaches, no numbness, no seizures, no syncope, no weakness, no tremors. Hematologic: No lymphadenopathy, no easy bruising. Psychiatric: No confusion, no hallucinations, no sleep disturbance.    Physical Exam: Filed Vitals:   11/20/11 1401  BP: 116/54  Pulse: 71   the general appearance reveals an elderly woman on oxygen.  He is cushingoid from long-term steroid use.Pupils equal and reactive.   Extraocular Movements are full.  There is no scleral icterus.  The mouth and pharynx are normal.  The neck is supple.  The carotids reveal no bruits.  The jugular venous pressure is normal.  The thyroid is not enlarged.  There is no lymphadenopathy.  He does have multiple lipoma across her upper chest neck and upper back.  Chest reveals increased AP diameter and distant breath sounds.  Heart reveals no gallop. The abdomen is soft and nontender. Bowel sounds are normal. The liver and spleen are not enlarged. There Are no abdominal masses. There are no bruits. Extremities revealed no phlebitis or edema     Assessment / Plan: The patient is to continue same medication and be rechecked in 4 months for followup office visit lipid panel hepatic function panel and basal metabolic.  Blood work done today is pending.

## 2011-11-20 NOTE — Patient Instructions (Signed)
Your physician recommends that you continue on your current medications as directed. Please refer to the Current Medication list given to you today.  Your physician wants you to follow-up in: 4 months with fasting labs You will receive a reminder letter in the mail two months in advance. If you don't receive a letter, please call our office to schedule the follow-up appointment.

## 2011-11-21 ENCOUNTER — Telehealth: Payer: Self-pay | Admitting: *Deleted

## 2011-11-21 NOTE — Telephone Encounter (Signed)
Message copied by Burnell Blanks on Fri Nov 21, 2011 10:22 AM ------      Message from: Cassell Clement      Created: Thu Nov 20, 2011  8:38 PM       Please report to patient.  The recent labs are stable. Continue same medication and careful diet.

## 2011-11-21 NOTE — Telephone Encounter (Signed)
Advised of labs 

## 2011-12-10 ENCOUNTER — Ambulatory Visit: Payer: Medicare Other | Admitting: Critical Care Medicine

## 2011-12-11 ENCOUNTER — Ambulatory Visit (INDEPENDENT_AMBULATORY_CARE_PROVIDER_SITE_OTHER): Payer: Medicare Other | Admitting: Critical Care Medicine

## 2011-12-11 ENCOUNTER — Encounter: Payer: Self-pay | Admitting: Critical Care Medicine

## 2011-12-11 VITALS — BP 128/64 | HR 66 | Temp 99.7°F | Ht 59.0 in | Wt 149.8 lb

## 2011-12-11 DIAGNOSIS — J449 Chronic obstructive pulmonary disease, unspecified: Secondary | ICD-10-CM

## 2011-12-11 MED ORDER — PREDNISONE 10 MG PO TABS: ORAL_TABLET | ORAL | Status: DC

## 2011-12-11 MED ORDER — AZITHROMYCIN 250 MG PO TABS: 250.0000 mg | ORAL_TABLET | Freq: Every day | ORAL | Status: AC

## 2011-12-11 NOTE — Assessment & Plan Note (Signed)
Endstage golds stage D. COPD Acute tracheobronchitis Plan Azithromycin for 5 days Prednisone pulse

## 2011-12-11 NOTE — Patient Instructions (Signed)
Increase prednisone to 4 daily for 5 days then reduce to one daily and stay Azithromycin 250mg  Take two once then one daily until gone No other medication changes Return as needed

## 2011-12-11 NOTE — Progress Notes (Signed)
Subjective:    Patient ID: [name], female    DOB: [birth year], 78 y.o.   MRN: [id number]  HPI   This is a 78 y.o.   white female with advanced chronic obstructive lung disease with primary emphysematous component. The patient has chronic abdominal distention from partial small bowel dysfunction, and obstruction. Pt now in [facility]   12/11/2011 Not seen since 12/12.   CAT 31 Still in hospice.   Pt still in home. Symptoms are worse and stress with husband.   Now has some cough and will then settle down.  Notes pain in chest and in sides.  Anginal symptoms as well. Morphine helps.  Tolerates the morphine with bowels.  Bowels do move.  Notes some edema in feet.  Past Medical History  Diagnosis Date  . Von Willebrand's disease   . CORONARY ARTERY DISEASE   . DIVERTICULOSIS, COLON   . CEREBROVASCULAR ACCIDENT, HX OF 1991  . SMALL BOWEL OBSTRUCTION, HX OF   . CAROTID ENDARTERECTOMY, HX OF 06/23/1992  . COPD     end stage, O2 dep  . OSTEOPOROSIS   . HYPERTENSION   . GERD   . Depression   . Hyperlipidemia      Family History  Problem Relation Age of Onset  . Arthritis Mother   . Arthritis Father   . Heart disease Father   . Hyperlipidemia Father   . Hypertension Father      History   Social History  . Marital Status: Married    Spouse Name: N/A    Number of Children: N/A  . Years of Education: N/A   Occupational History  . Not on file.   Social History Main Topics  . Smoking status: Former Smoker -- 1.5 packs/day for 40 years    Quit date: 06/23/1988  . Smokeless tobacco: Never Used  . Alcohol Use: Yes     2 glass each night  . Drug Use: No  . Sexually Active: Not Currently   Other Topics Concern  . Not on file   Social History Narrative  . No narrative on file     Allergies  Allergen Reactions  . Ciprofloxacin   . Hctz (Hydrochlorothiazide)     rash  . Klotrix (Potassium Chloride)   . Other     Powder on gloves,avoids ASA re-prolonged PTT  .  Pravachol     LFT  . Sulfa Antibiotics   . Tetracyclines & Related     Vaginal rash     Outpatient Prescriptions Prior to Visit  Medication Sig Dispense Refill  . acetaminophen (TYLENOL) 325 MG tablet Take 650 mg by mouth every 6 (six) hours as needed.        Marland Kitchen albuterol (PROVENTIL) (2.5 MG/3ML) 0.083% nebulizer solution Use four times daily as needed  120 vial  6  . ALPRAZolam (XANAX) 1 MG tablet Take 1 tablet (1 mg total) by mouth 3 (three) times daily as needed.  90 tablet  3  . amLODipine (NORVASC) 5 MG tablet TAKE 1 TABLET BY MOUTH EVERY DAY  30 tablet  11  . dextromethorphan (DELSYM) 30 MG/5ML liquid Take 60 mg by mouth as needed.        Marland Kitchen esomeprazole (NEXIUM) 40 MG capsule Take 40 mg by mouth daily before breakfast.        . fluticasone (FLONASE) 50 MCG/ACT nasal spray USE 2 SPRAYS IN BOTH NOSTRILS AT BEDTIME  16 g  0  . furosemide (LASIX) 40 MG tablet TAKE  1 TABLET EVERY DAY  30 tablet  11  . guaifenesin (ROBITUSSIN) 100 MG/5ML syrup Take 200 mg by mouth as needed.        . hydroxypropyl methylcellulose (ISOPTO TEARS) 2.5 % ophthalmic solution 1 drop as needed.        . isosorbide mononitrate (IMDUR) 60 MG 24 hr tablet Take 1 tablet (60 mg total) by mouth daily.  30 tablet  11  . lovastatin (MEVACOR) 40 MG tablet TAKE 1 TABLET BY MOUTH EVERY DAY  90 tablet  PRN  . metoprolol tartrate (LOPRESSOR) 25 MG tablet TAKE 2 TABLETS BY MOUTH EVERY DAY  180 tablet  PRN  . morphine 20 MG/5ML solution Take 0.6 mLs (2.4 mg total) by mouth every 2 (two) hours as needed for pain (shortness of breath).    0  . nitroGLYCERIN (NITROSTAT) 0.4 MG SL tablet Place 0.4 mg under the tongue every 5 (five) minutes as needed.        Marland Kitchen PARoxetine (PAXIL) 10 MG tablet TAKE 1 TABLET (10 MG TOTAL) BY MOUTH EVERY MORNING.  30 tablet  3  . polyethylene glycol powder (GLYCOLAX/MIRALAX) powder USE AS DIRECTED ONCE DAILY  527 g  3  . PROAIR HFA 108 (90 BASE) MCG/ACT inhaler INHALE 1 TO 2 PUFFS EVERY 4 TO 6 HOURS AS  NEEDED  1 Inhaler  6  . prochlorperazine (COMPAZINE) 10 MG tablet Take 1 tablet by mouth every 6 hours as needed for nausea and vomiting      . SPIRIVA HANDIHALER 18 MCG inhalation capsule USE ONE CAPSULE IN HANDIHALER AS DIRECTED ONCE DAILY  30 each  6  . valsartan (DIOVAN) 160 MG tablet 1/2 tablet once daily      . predniSONE (DELTASONE) 10 MG tablet Take 10 mg by mouth daily.      Marland Kitchen arformoterol (BROVANA) 15 MCG/2ML NEBU Take 2 mLs (15 mcg total) by nebulization 2 (two) times daily. DX:  496  120 mL  6      Review of Systems  Constitutional:   No  weight loss, night sweats,  Fevers, chills, fatigue, lassitude. HEENT:   No headaches,  Difficulty swallowing,  Tooth/dental problems,  Sore throat,                No sneezing, itching, ear ache, nasal congestion, post nasal drip,   CV:  Notes anginal  chest pain,  Orthopnea, PND, swelling in lower extremities, anasarca,  Notes dizziness, no palpitations  GI  No heartburn, indigestion,  Notes abdominal pain   no nausea, vomiting, diarrhea, change in bowel habits, loss of appetite  Resp: Notes  shortness of breath with exertion and  at rest.  No excess mucus, no productive cough,  No non-productive cough,  No coughing up of blood.  No change in color of mucus.  No wheezing.  No chest wall deformity  Skin: no rash or lesions.  GU: no dysuria, change in color of urine, no urgency or frequency.  No flank pain.  MS:  No joint pain or swelling.  No decreased range of motion.  No back pain.  Psych:  No change in mood or affect. No depression or anxiety.  No memory loss.     Objective:   Physical Exam  Filed Vitals:   12/11/11 1122  BP: 128/64  Pulse: 66  Temp: 99.7 F (37.6 C)  TempSrc: Oral  Height: 4\' 11"  (1.499 m)  Weight: 149 lb 12.8 oz (67.949 kg)  SpO2: 92%  Gen: depressed WF, flat affect  ENT: No lesions,  mouth clear,  oropharynx clear, no postnasal drip  Neck: No JVD, no TMG, no carotid bruits  Lungs: No use of  accessory muscles, no dullness to percussion, distant bs , poor airflow  Cardiovascular: RRR, heart sounds normal, no murmur or gallops, no peripheral edema  Abdomen: soft and NT, no HSM,  BS normal  Musculoskeletal: No deformities, no cyanosis or clubbing  Neuro: alert, non focal  Skin: Warm, no lesions or rashes        Assessment & Plan:   COPD Endstage golds stage D. COPD Acute tracheobronchitis Plan Azithromycin for 5 days Prednisone pulse    Updated Medication List Outpatient Encounter Prescriptions as of 12/11/2011  Medication Sig Dispense Refill  . acetaminophen (TYLENOL) 325 MG tablet Take 650 mg by mouth every 6 (six) hours as needed.        Marland Kitchen albuterol (PROVENTIL) (2.5 MG/3ML) 0.083% nebulizer solution Use four times daily as needed  120 vial  6  . ALPRAZolam (XANAX) 1 MG tablet Take 1 tablet (1 mg total) by mouth 3 (three) times daily as needed.  90 tablet  3  . amLODipine (NORVASC) 5 MG tablet TAKE 1 TABLET BY MOUTH EVERY DAY  30 tablet  11  . arformoterol (BROVANA) 15 MCG/2ML NEBU Take 15 mcg by nebulization. 1-2 times daily      . dextromethorphan (DELSYM) 30 MG/5ML liquid Take 60 mg by mouth as needed.        Marland Kitchen esomeprazole (NEXIUM) 40 MG capsule Take 40 mg by mouth daily before breakfast.        . fluticasone (FLONASE) 50 MCG/ACT nasal spray USE 2 SPRAYS IN BOTH NOSTRILS AT BEDTIME  16 g  0  . furosemide (LASIX) 40 MG tablet TAKE 1 TABLET EVERY DAY  30 tablet  11  . guaifenesin (ROBITUSSIN) 100 MG/5ML syrup Take 200 mg by mouth as needed.        . hydroxypropyl methylcellulose (ISOPTO TEARS) 2.5 % ophthalmic solution 1 drop as needed.        . isosorbide mononitrate (IMDUR) 60 MG 24 hr tablet Take 1 tablet (60 mg total) by mouth daily.  30 tablet  11  . lovastatin (MEVACOR) 40 MG tablet TAKE 1 TABLET BY MOUTH EVERY DAY  90 tablet  PRN  . metoprolol tartrate (LOPRESSOR) 25 MG tablet TAKE 2 TABLETS BY MOUTH EVERY DAY  180 tablet  PRN  . morphine 20 MG/5ML  solution Take 0.6 mLs (2.4 mg total) by mouth every 2 (two) hours as needed for pain (shortness of breath).    0  . nitroGLYCERIN (NITROSTAT) 0.4 MG SL tablet Place 0.4 mg under the tongue every 5 (five) minutes as needed.        Marland Kitchen PARoxetine (PAXIL) 10 MG tablet TAKE 1 TABLET (10 MG TOTAL) BY MOUTH EVERY MORNING.  30 tablet  3  . polyethylene glycol powder (GLYCOLAX/MIRALAX) powder USE AS DIRECTED ONCE DAILY  527 g  3  . predniSONE (DELTASONE) 10 MG tablet Take 4 daily for 5 days then one daily thereafter  30 tablet  11  . PROAIR HFA 108 (90 BASE) MCG/ACT inhaler INHALE 1 TO 2 PUFFS EVERY 4 TO 6 HOURS AS NEEDED  1 Inhaler  6  . prochlorperazine (COMPAZINE) 10 MG tablet Take 1 tablet by mouth every 6 hours as needed for nausea and vomiting      . SPIRIVA HANDIHALER 18 MCG inhalation capsule USE ONE CAPSULE  IN HANDIHALER AS DIRECTED ONCE DAILY  30 each  6  . valsartan (DIOVAN) 160 MG tablet 1/2 tablet once daily      . DISCONTD: predniSONE (DELTASONE) 10 MG tablet Take 10 mg by mouth daily.      Marland Kitchen azithromycin (ZITHROMAX) 250 MG tablet Take 1 tablet (250 mg total) by mouth daily. Take two once then one daily until gone  6 each  0  . DISCONTD: arformoterol (BROVANA) 15 MCG/2ML NEBU Take 2 mLs (15 mcg total) by nebulization 2 (two) times daily. DX:  496  120 mL  6

## 2011-12-12 ENCOUNTER — Other Ambulatory Visit: Payer: Self-pay | Admitting: Critical Care Medicine

## 2011-12-15 NOTE — Telephone Encounter (Signed)
Pt is requesting a refill on Xanax 1 mg 1 tablet by mouth three times a day. Please advise if ok to refill. Last rx was on 06-19-12. Carron Curie, CMA

## 2011-12-16 NOTE — Telephone Encounter (Signed)
Refill called in. Terita Hejl, CMA  

## 2012-01-16 ENCOUNTER — Other Ambulatory Visit: Payer: Self-pay | Admitting: Critical Care Medicine

## 2012-02-11 ENCOUNTER — Other Ambulatory Visit: Payer: Self-pay | Admitting: Critical Care Medicine

## 2012-02-18 ENCOUNTER — Other Ambulatory Visit: Payer: Self-pay | Admitting: Critical Care Medicine

## 2012-02-20 ENCOUNTER — Other Ambulatory Visit: Payer: Self-pay | Admitting: Critical Care Medicine

## 2012-02-20 MED ORDER — ARFORMOTEROL TARTRATE 15 MCG/2ML IN NEBU: 15.0000 ug | INHALATION_SOLUTION | Freq: Two times a day (BID) | RESPIRATORY_TRACT | Status: DC

## 2012-02-20 NOTE — Telephone Encounter (Signed)
Called and spoke with Jennifer--patient home health nurse--stated patient needed refill on Brovana.  Verified pharmacy and Rx sent in.  Nothing further needed at this time.

## 2012-02-27 ENCOUNTER — Other Ambulatory Visit: Payer: Self-pay | Admitting: Critical Care Medicine

## 2012-03-19 ENCOUNTER — Telehealth: Payer: Self-pay | Admitting: Critical Care Medicine

## 2012-03-19 NOTE — Telephone Encounter (Signed)
Victorino Dike calling to inform Dr. Delford Field pt will be going to [facility] on Monday, Sept 30 for a 5 night respite stay.  Will forward msg to him as FYI.

## 2012-03-21 NOTE — Telephone Encounter (Signed)
noted 

## 2012-04-02 ENCOUNTER — Other Ambulatory Visit: Payer: Self-pay | Admitting: *Deleted

## 2012-04-02 MED ORDER — VALSARTAN 160 MG PO TABS: ORAL_TABLET | ORAL | Status: DC

## 2012-04-02 NOTE — Telephone Encounter (Signed)
Fax Received. Refill Completed. Neidra Girvan Chowoe (R.M.A)   

## 2012-04-16 ENCOUNTER — Other Ambulatory Visit: Payer: Self-pay | Admitting: Critical Care Medicine

## 2012-06-04 ENCOUNTER — Other Ambulatory Visit: Payer: Self-pay | Admitting: Critical Care Medicine

## 2012-06-17 ENCOUNTER — Other Ambulatory Visit: Payer: Self-pay | Admitting: *Deleted

## 2012-06-17 MED ORDER — FUROSEMIDE 40 MG PO TABS: 40.0000 mg | ORAL_TABLET | Freq: Every day | ORAL | Status: DC

## 2012-06-18 ENCOUNTER — Other Ambulatory Visit: Payer: Self-pay | Admitting: Critical Care Medicine

## 2012-06-24 ENCOUNTER — Other Ambulatory Visit: Payer: Self-pay | Admitting: Critical Care Medicine

## 2012-06-25 ENCOUNTER — Telehealth: Payer: Self-pay | Admitting: Critical Care Medicine

## 2012-06-25 NOTE — Telephone Encounter (Signed)
Will forward to PW as FYI per protocol.

## 2012-06-26 NOTE — Telephone Encounter (Signed)
noted 

## 2012-07-01 ENCOUNTER — Other Ambulatory Visit: Payer: Self-pay | Admitting: *Deleted

## 2012-07-01 MED ORDER — POLYETHYLENE GLYCOL 3350 17 GM/SCOOP PO POWD: ORAL | Status: DC

## 2012-07-02 ENCOUNTER — Other Ambulatory Visit: Payer: Self-pay | Admitting: Critical Care Medicine

## 2012-07-02 NOTE — Telephone Encounter (Signed)
Miralax rx was sent on 07/01/12.  Called, spoke with Pharmacist at [facility].  Was advised they did receive the rx from yesterday and have already filled the medication.  Nothing further needed at this time.

## 2012-07-22 ENCOUNTER — Other Ambulatory Visit: Payer: Self-pay

## 2012-07-22 MED ORDER — ISOSORBIDE MONONITRATE ER 60 MG PO TB24: 60.0000 mg | ORAL_TABLET | Freq: Every day | ORAL | Status: DC

## 2012-07-27 ENCOUNTER — Other Ambulatory Visit: Payer: Medicare Other

## 2012-07-27 ENCOUNTER — Other Ambulatory Visit (INDEPENDENT_AMBULATORY_CARE_PROVIDER_SITE_OTHER): Payer: Medicare Other

## 2012-07-27 DIAGNOSIS — I2510 Atherosclerotic heart disease of native coronary artery without angina pectoris: Secondary | ICD-10-CM

## 2012-07-27 DIAGNOSIS — I119 Hypertensive heart disease without heart failure: Secondary | ICD-10-CM

## 2012-07-27 LAB — HEPATIC FUNCTION PANEL
AST: 21 U/L (ref 0–37)
Albumin: 4.1 g/dL (ref 3.5–5.2)
Alkaline Phosphatase: 35 U/L — ABNORMAL LOW (ref 39–117)
Total Protein: 7.1 g/dL (ref 6.0–8.3)

## 2012-07-27 LAB — BASIC METABOLIC PANEL
CO2: 35 mEq/L — ABNORMAL HIGH (ref 19–32)
Calcium: 8.9 mg/dL (ref 8.4–10.5)
Creatinine, Ser: 1 mg/dL (ref 0.4–1.2)
GFR: 57.7 mL/min — ABNORMAL LOW (ref >60.00)
Sodium: 140 mEq/L (ref 135–145)

## 2012-07-27 LAB — LIPID PANEL
Cholesterol: 198 mg/dL (ref 0–200)
HDL: 50.3 mg/dL (ref >39.00)
Total CHOL/HDL Ratio: 4
Triglycerides: 157 mg/dL — ABNORMAL HIGH (ref 0.0–149.0)

## 2012-07-27 NOTE — Progress Notes (Signed)
Quick Note:  Please make copy of labs for patient visit. ______ 

## 2012-07-28 ENCOUNTER — Other Ambulatory Visit: Payer: Medicare Other

## 2012-07-30 ENCOUNTER — Ambulatory Visit (INDEPENDENT_AMBULATORY_CARE_PROVIDER_SITE_OTHER): Payer: Medicare Other | Admitting: Cardiology

## 2012-07-30 ENCOUNTER — Encounter: Payer: Self-pay | Admitting: Cardiology

## 2012-07-30 VITALS — BP 120/60 | HR 70 | Resp 18 | Ht 61.0 in | Wt 151.0 lb

## 2012-07-30 DIAGNOSIS — I119 Hypertensive heart disease without heart failure: Secondary | ICD-10-CM

## 2012-07-30 DIAGNOSIS — I2510 Atherosclerotic heart disease of native coronary artery without angina pectoris: Secondary | ICD-10-CM

## 2012-07-30 DIAGNOSIS — I259 Chronic ischemic heart disease, unspecified: Secondary | ICD-10-CM

## 2012-07-30 DIAGNOSIS — E785 Hyperlipidemia, unspecified: Secondary | ICD-10-CM

## 2012-07-30 NOTE — Patient Instructions (Addendum)
Your physician recommends that you continue on your current medications as directed. Please refer to the Current Medication list given to you today.  Your physician wants you to follow-up in: 6 months with fasting labs (lp/bmet/hfp)  You will receive a reminder letter in the mail two months in advance. If you don't receive a letter, please call our office to schedule the follow-up appointment.  

## 2012-07-30 NOTE — Assessment & Plan Note (Addendum)
The patient has a history of known coronary artery disease.  She has occasional episodes of angina pectoris requiring sublingual nitroglycerin.  She estimates that she averages about 2 or 3 sublingual nitroglycerin per week.  She is in the hospice program.  She also has oral morphine solution which she uses for pain and this also helps her shortness of breath and her anginal pain.  She is not on aspirin or Plavix because of her von Willebrand's disease.

## 2012-07-30 NOTE — Progress Notes (Signed)
[name] Date of Birth:  [birth year] [facility] [address] [place], [place]  [address] [phone]         Fax   [phone]  History of Present Illness: This pleasant 79 year old woman is seen for a four-month followup office visit. She has severe COPD and is on continuous oxygen. She is on hospice program. She has a history of ischemic heart disease. Has a history of von Willebrand's disease and a history of hypercholesterolemia and essential hypertension.  Since last visit she has been stable.  She and her husband are considering moving from her home into [facility].   Current Outpatient Prescriptions  Medication Sig Dispense Refill  . acetaminophen (TYLENOL) 325 MG tablet Take 650 mg by mouth every 6 (six) hours as needed.        Marland Kitchen albuterol (PROVENTIL) (2.5 MG/3ML) 0.083% nebulizer solution Use four times daily as needed  120 vial  6  . ALPRAZolam (XANAX) 1 MG tablet TAKE 1 TABLET BY MOUTH THREE TIMES DAILYAS NEEDED  90 tablet  4  . amLODipine (NORVASC) 5 MG tablet TAKE 1 TABLET BY MOUTH EVERY DAY  30 tablet  11  . arformoterol (BROVANA) 15 MCG/2ML NEBU Take 2 mLs (15 mcg total) by nebulization 2 (two) times daily. 1-2 times daily  120 mL  5  . dextromethorphan (DELSYM) 30 MG/5ML liquid Take 60 mg by mouth as needed.        . fluticasone (FLONASE) 50 MCG/ACT nasal spray USE 2 SPRAYS IN BOTH NOSTRILS AT BEDTIME  16 g  0  . furosemide (LASIX) 40 MG tablet Take 1 tablet (40 mg total) by mouth daily.  30 tablet  11  . guaifenesin (ROBITUSSIN) 100 MG/5ML syrup Take 200 mg by mouth as needed.        . hydroxypropyl methylcellulose (ISOPTO TEARS) 2.5 % ophthalmic solution 1 drop as needed.        . isosorbide mononitrate (IMDUR) 60 MG 24 hr tablet Take 1 tablet (60 mg total) by mouth daily.  30 tablet  3  . lovastatin (MEVACOR) 40 MG tablet TAKE 1 TABLET BY MOUTH EVERY DAY  90 tablet  PRN  . metoprolol tartrate (LOPRESSOR) 25 MG  tablet TAKE 2 TABLETS BY MOUTH EVERY DAY  180 tablet  PRN  . morphine 20 MG/5ML solution Take 0.6 mLs (2.4 mg total) by mouth every 2 (two) hours as needed for pain (shortness of breath).    0  . NEXIUM 40 MG capsule TAKE ONE (1) TABLET BY MOUTH EVERY DAY  30 capsule  3  . nitroGLYCERIN (NITROSTAT) 0.4 MG SL tablet Place 0.4 mg under the tongue every 5 (five) minutes as needed.        Marland Kitchen PARoxetine (PAXIL) 10 MG tablet TAKE 1 TABLET BY MOUTH EVERY MORNING  30 tablet  3  . polyethylene glycol powder (GLYCOLAX/MIRALAX) powder use as directed daily  527 g  1  . predniSONE (DELTASONE) 10 MG tablet Take 4 daily for 5 days then one daily thereafter  30 tablet  11  . PROAIR HFA 108 (90 BASE) MCG/ACT inhaler INHALE 1 TO 2 PUFFS EVERY 4 TO 6 HOURS AS NEEDED  1 Inhaler  6  . prochlorperazine (COMPAZINE) 10 MG tablet Take 1 tablet by mouth every 6 hours as needed for nausea and vomiting      . valsartan (DIOVAN) 160 MG tablet 0.5 tablet once daily  90 tablet  3  . SPIRIVA HANDIHALER  18 MCG inhalation capsule INHALE CONTENTS OF 1 CAPSULE  DAILY  30 each  4    Allergies  Allergen Reactions  . Aspirin     Patient has von Willebrand's disease.  Aspirin prolonged bleeding time  . Ciprofloxacin   . Hctz (Hydrochlorothiazide)     rash  . Klotrix (Potassium Chloride)   . Other     Powder on gloves,avoids ASA re-prolonged PTT  . Pravachol     LFT  . Sulfa Antibiotics   . Tetracyclines & Related     Vaginal rash    Patient Active Problem List  Diagnosis  . VON [facility] DISEASE  . ADJUSTMENT DISORDER WITH ANXIETY  . HYPERTENSION  . CORONARY ARTERY DISEASE  . COPD  . GERD  . DIVERTICULOSIS, COLON  . INFECTION, URINARY TRACT NOS  . OSTEOPOROSIS  . CEREBROVASCULAR ACCIDENT, HX OF  . SMALL BOWEL OBSTRUCTION, HX OF  . CAROTID ENDARTERECTOMY, HX OF  . Dyshidrotic eczema  . Vaginitis  . Hyperlipidemia    History  Smoking status  . Former Smoker -- 1.5 packs/day for 40 years  . Quit date:  06/23/1988  Smokeless tobacco  . Never Used    History  Alcohol Use  . Yes    Comment: 2 glass each night    Family History  Problem Relation Age of Onset  . Arthritis Mother   . Arthritis Father   . Heart disease Father   . Hyperlipidemia Father   . Hypertension Father     Review of Systems: Constitutional: no fever chills diaphoresis or fatigue or change in weight.  Head and neck: no hearing loss, no epistaxis, no photophobia or visual disturbance. Respiratory: No cough, shortness of breath or wheezing. Cardiovascular: No chest pain peripheral edema, palpitations. Gastrointestinal: No abdominal distention, no abdominal pain, no change in bowel habits hematochezia or melena. Genitourinary: No dysuria, no frequency, no urgency, no nocturia. Musculoskeletal:No arthralgias, no back pain, no gait disturbance or myalgias. Neurological: No dizziness, no headaches, no numbness, no seizures, no syncope, no weakness, no tremors. Hematologic: No lymphadenopathy, no easy bruising. Psychiatric: No confusion, no hallucinations, no sleep disturbance.    Physical Exam: Filed Vitals:   07/30/12 1546  BP: 120/60  Pulse: 70  Resp: 18   the general appearance reveals an elderly woman in no acute distress.  She has nasal oxygen in place.The head and neck exam reveals pupils equal and reactive.  Extraocular movements are full.  There is no scleral icterus.  The mouth and pharynx are normal.  The neck is supple.  The carotids reveal no bruits.  The jugular venous pressure is normal.  The  thyroid is not enlarged.  There is no lymphadenopathy.  The chest reveals prolonged expiratory phase bilaterally There are no rales or rhonchi.  Expansion of the chest is symmetrical.  The precordium is quiet.  The first heart sound is normal.  The second heart sound is physiologically split.  There is no murmur gallop rub or click.  There is no abnormal lift or heave.  The abdomen is soft and nontender.  The  bowel sounds are normal.  The liver and spleen are not enlarged.  There are no abdominal masses.  There are no abdominal bruits.  Extremities reveal good pedal pulses.  There is no phlebitis or edema.  There is no cyanosis or clubbing.  Strength is normal and symmetrical in all extremities.  There is no lateralizing weakness.  There are no sensory deficits.  The skin is  warm and dry.  There is no rash.     Assessment / Plan:  The patient is to continue same medication.  She continues under hospice care and she will continue with hospice even after she moves to [facility].  Recheck here in 6 months for followup office visit lipid panel hepatic function panel and basal metabolic panel.

## 2012-07-30 NOTE — Assessment & Plan Note (Signed)
Patient has a history of hypercholesterolemia and hyperlipidemia.  She is on lovastatin and is not having any side effects.  Blood work today shows LDL cholesterol 116 and triglycerides 157 with HDL of 50 and total cholesterol of 198 the liver function studies are normal and her blood sugar is 130.  She will continue on same dose of lovastatin and work harder on careful diet.  She is unable to exercise because of her pulmonary condition and she is on round-the-clock nasal oxygen.

## 2012-07-30 NOTE — Assessment & Plan Note (Signed)
The patient's blood pressure was remaining stable on current therapy.  No headaches.  No dizziness or syncope.

## 2012-08-04 ENCOUNTER — Other Ambulatory Visit: Payer: Self-pay | Admitting: Critical Care Medicine

## 2012-08-04 NOTE — Telephone Encounter (Signed)
Xanax 1 mg rx was last called in on 06/24/12 # 90 x 4.  Called [facility], spoke with Casimiro Needle.  Was advised they did receive the rx from 06/24/12 but refills were not added.  He will add the rxs to rx.  Nothing further needed.

## 2012-08-11 ENCOUNTER — Inpatient Hospital Stay (HOSPITAL_COMMUNITY)
Admission: EM | Admit: 2012-08-11 | Discharge: 2012-08-13 | DRG: 563 | Disposition: A | Discharge status: Hospice | Attending: Internal Medicine | Admitting: Internal Medicine

## 2012-08-11 ENCOUNTER — Encounter (HOSPITAL_COMMUNITY): Payer: Self-pay | Admitting: *Deleted

## 2012-08-11 ENCOUNTER — Emergency Department (HOSPITAL_COMMUNITY)

## 2012-08-11 DIAGNOSIS — Z9889 Other specified postprocedural states: Secondary | ICD-10-CM

## 2012-08-11 DIAGNOSIS — Z9981 Dependence on supplemental oxygen: Secondary | ICD-10-CM

## 2012-08-11 DIAGNOSIS — IMO0002 Reserved for concepts with insufficient information to code with codable children: Secondary | ICD-10-CM

## 2012-08-11 DIAGNOSIS — S4291XA Fracture of right shoulder girdle, part unspecified, initial encounter for closed fracture: Secondary | ICD-10-CM

## 2012-08-11 DIAGNOSIS — S42213A Unspecified displaced fracture of surgical neck of unspecified humerus, initial encounter for closed fracture: Principal | ICD-10-CM | POA: Diagnosis present

## 2012-08-11 DIAGNOSIS — F4322 Adjustment disorder with anxiety: Secondary | ICD-10-CM

## 2012-08-11 DIAGNOSIS — Z8719 Personal history of other diseases of the digestive system: Secondary | ICD-10-CM

## 2012-08-11 DIAGNOSIS — Z8679 Personal history of other diseases of the circulatory system: Secondary | ICD-10-CM

## 2012-08-11 DIAGNOSIS — Z515 Encounter for palliative care: Secondary | ICD-10-CM

## 2012-08-11 DIAGNOSIS — K219 Gastro-esophageal reflux disease without esophagitis: Secondary | ICD-10-CM

## 2012-08-11 DIAGNOSIS — N390 Urinary tract infection, site not specified: Secondary | ICD-10-CM

## 2012-08-11 DIAGNOSIS — E785 Hyperlipidemia, unspecified: Secondary | ICD-10-CM

## 2012-08-11 DIAGNOSIS — S42301A Unspecified fracture of shaft of humerus, right arm, initial encounter for closed fracture: Secondary | ICD-10-CM

## 2012-08-11 DIAGNOSIS — L301 Dyshidrosis [pompholyx]: Secondary | ICD-10-CM

## 2012-08-11 DIAGNOSIS — I119 Hypertensive heart disease without heart failure: Secondary | ICD-10-CM

## 2012-08-11 DIAGNOSIS — J4489 Other specified chronic obstructive pulmonary disease: Secondary | ICD-10-CM

## 2012-08-11 DIAGNOSIS — J449 Chronic obstructive pulmonary disease, unspecified: Secondary | ICD-10-CM

## 2012-08-11 DIAGNOSIS — W010XXA Fall on same level from slipping, tripping and stumbling without subsequent striking against object, initial encounter: Secondary | ICD-10-CM | POA: Diagnosis present

## 2012-08-11 DIAGNOSIS — Z66 Do not resuscitate: Secondary | ICD-10-CM | POA: Diagnosis present

## 2012-08-11 DIAGNOSIS — Y92009 Unspecified place in unspecified non-institutional (private) residence as the place of occurrence of the external cause: Secondary | ICD-10-CM

## 2012-08-11 DIAGNOSIS — M810 Age-related osteoporosis without current pathological fracture: Secondary | ICD-10-CM

## 2012-08-11 DIAGNOSIS — D6800 Von Willebrand disease, unspecified: Secondary | ICD-10-CM

## 2012-08-11 DIAGNOSIS — K5730 Diverticulosis of large intestine without perforation or abscess without bleeding: Secondary | ICD-10-CM

## 2012-08-11 DIAGNOSIS — N760 Acute vaginitis: Secondary | ICD-10-CM

## 2012-08-11 DIAGNOSIS — I2510 Atherosclerotic heart disease of native coronary artery without angina pectoris: Secondary | ICD-10-CM

## 2012-08-11 DIAGNOSIS — J441 Chronic obstructive pulmonary disease with (acute) exacerbation: Secondary | ICD-10-CM

## 2012-08-11 LAB — URINALYSIS, ROUTINE W REFLEX MICROSCOPIC
Ketones, ur: NEGATIVE mg/dL
Leukocytes, UA: NEGATIVE
Nitrite: NEGATIVE
Specific Gravity, Urine: 1.014 (ref 1.005–1.030)
Urobilinogen, UA: 0.2 mg/dL (ref 0.0–1.0)
pH: 6.5 (ref 5.0–8.0)

## 2012-08-11 LAB — BASIC METABOLIC PANEL
BUN: 12 mg/dL (ref 6–23)
Chloride: 96 mEq/L (ref 96–112)
Glucose, Bld: 165 mg/dL — ABNORMAL HIGH (ref 70–99)
Potassium: 4 mEq/L (ref 3.5–5.1)

## 2012-08-11 LAB — CBC WITH DIFFERENTIAL/PLATELET
Basophils Relative: 0 % (ref 0–1)
Eosinophils Absolute: 0 10*3/uL (ref 0.0–0.7)
HCT: 33.9 % — ABNORMAL LOW (ref 36.0–46.0)
Hemoglobin: 10.9 g/dL — ABNORMAL LOW (ref 12.0–15.0)
MCH: 31.2 pg (ref 26.0–34.0)
MCHC: 32.2 g/dL (ref 30.0–36.0)
MCV: 97.1 fL (ref 78.0–100.0)
Monocytes Absolute: 0.8 10*3/uL (ref 0.1–1.0)
Monocytes Relative: 10 % (ref 3–12)

## 2012-08-11 MED ORDER — MORPHINE SULFATE 2 MG/ML IJ SOLN
2.0000 mg | INTRAMUSCULAR | Status: DC | PRN
  Administered 2012-08-11: 2 mg via INTRAVENOUS
  Filled 2012-08-11 (×2): qty 1

## 2012-08-11 MED ORDER — ALBUTEROL SULFATE (5 MG/ML) 0.5% IN NEBU
5.0000 mg | INHALATION_SOLUTION | Freq: Once | RESPIRATORY_TRACT | Status: AC
  Administered 2012-08-11: 5 mg via RESPIRATORY_TRACT
  Filled 2012-08-11: qty 1

## 2012-08-11 MED ORDER — IPRATROPIUM BROMIDE 0.02 % IN SOLN
0.5000 mg | Freq: Once | RESPIRATORY_TRACT | Status: AC
  Administered 2012-08-11: 0.5 mg via RESPIRATORY_TRACT
  Filled 2012-08-11: qty 2.5

## 2012-08-11 MED ORDER — SODIUM CHLORIDE 0.9 % IV SOLN
Freq: Once | INTRAVENOUS | Status: AC
  Administered 2012-08-11: 22:00:00 via INTRAVENOUS

## 2012-08-11 MED ORDER — METHYLPREDNISOLONE SODIUM SUCC 125 MG IJ SOLR
125.0000 mg | Freq: Once | INTRAMUSCULAR | Status: AC
  Administered 2012-08-11: 125 mg via INTRAVENOUS
  Filled 2012-08-11: qty 2

## 2012-08-11 NOTE — ED Provider Notes (Signed)
History     CSN: [id number]  Arrival date & time 08/11/12  [place]   First MD Initiated Contact with Patient 08/11/12 1931      Chief Complaint  Patient presents with  . Fall  . Shoulder Pain  . Shortness of Breath     HPI Pt was seen at 1840.    Per pt, c/o gradual onset and worsening of persistent cough, wheezing and SOB for the past 1 week.  Describes her symptoms as "my COPD is acting up."  Has been using home MDI and nebs without transient relief. Pt states she is chronically SOB, but that it has been "worse" over the past 1 week. Denies CP/palpitations, no back pain, no abd pain, no N/V/D, no fevers, no rash.  Pt also c/o sudden onset and resolution of one episode of slip and fall that occurred at home PTA.  Pt states she tripped over her O2 N/C tubing and fell onto her right shoulder.  Pt c/o right shoulder "pain," esp with attempts at movement.  Denies focal motor weakness, no tingling/numbness in extremities, no head injury, no LOC, no AMS, no neck or back pain, no hips pain, no syncope, no prodromal symptoms before fall.     On hospice for COPD Past Medical History  Diagnosis Date  . Von Willebrand's disease   . CORONARY ARTERY DISEASE   . DIVERTICULOSIS, COLON   . CEREBROVASCULAR ACCIDENT, HX OF 1991  . SMALL BOWEL OBSTRUCTION, HX OF   . CAROTID ENDARTERECTOMY, HX OF 06/23/1992  . COPD     end stage, O2 dep  . OSTEOPOROSIS   . HYPERTENSION   . GERD   . Depression   . Hyperlipidemia     Past Surgical History  Procedure Laterality Date  . Cholecystectomy  1965  . Appendectomy  1965  . Exploratory laparotomy  1965    peritonitis due to ruptured appx    Family History  Problem Relation Age of Onset  . Arthritis Mother   . Arthritis Father   . Heart disease Father   . Hyperlipidemia Father   . Hypertension Father     History  Substance Use Topics  . Smoking status: Former Smoker -- 1.50 packs/day for 40 years    Quit date: 06/23/1988  . Smokeless tobacco:  Never Used  . Alcohol Use: Yes     Comment: 2 glass each night    Review of Systems ROS: Statement: All systems negative except as marked or noted in the HPI; Constitutional: Negative for fever and chills. ; ; Eyes: Negative for eye pain, redness and discharge. ; ; ENMT: Negative for ear pain, hoarseness, nasal congestion, sinus pressure and sore throat. ; ; Cardiovascular: Negative for chest pain, palpitations, diaphoresis, and peripheral edema. ; ; Respiratory: +cough, SOB, wheezing. Negative for stridor. ; ; Gastrointestinal: Negative for nausea, vomiting, diarrhea, abdominal pain, blood in stool, hematemesis, jaundice and rectal bleeding. . ; ; Genitourinary: Negative for dysuria, flank pain and hematuria. ; ; Musculoskeletal: +right shoulder pain. Negative for back pain and neck pain. ; Skin: Negative for pruritus, rash, abrasions, blisters, bruising and skin lesion.; ; Neuro: Negative for headache, lightheadedness and neck stiffness. Negative for weakness, altered level of consciousness , altered mental status, extremity weakness, paresthesias, involuntary movement, seizure and syncope.       Allergies  Aspirin; Ciprofloxacin; Hctz; Klotrix; Other; Pravachol; Sulfa antibiotics; and Tetracyclines & related  Home Medications   Current Outpatient Rx  Name  Route  Sig  Dispense  Refill  . acetaminophen (TYLENOL) 325 MG tablet   Oral   Take 650 mg by mouth every 6 (six) hours as needed.           Marland Kitchen albuterol (PROVENTIL) (2.5 MG/3ML) 0.083% nebulizer solution      Use four times daily as needed   120 vial   6   . arformoterol (BROVANA) 15 MCG/2ML NEBU   Nebulization   Take 2 mLs (15 mcg total) by nebulization 2 (two) times daily. 1-2 times daily   120 mL   5   . dextromethorphan (DELSYM) 30 MG/5ML liquid   Oral   Take 60 mg by mouth as needed.           . furosemide (LASIX) 40 MG tablet   Oral   Take 1 tablet (40 mg total) by mouth daily.   30 tablet   11   .  guaifenesin (ROBITUSSIN) 100 MG/5ML syrup   Oral   Take 200 mg by mouth as needed.           . hydroxypropyl methylcellulose (ISOPTO TEARS) 2.5 % ophthalmic solution      1 drop as needed.           . isosorbide mononitrate (IMDUR) 60 MG 24 hr tablet   Oral   Take 1 tablet (60 mg total) by mouth daily.   30 tablet   3     Please have the patient to call the office at Ph:3 ...   . morphine 20 MG/5ML solution   Oral   Take 0.6 mLs (2.4 mg total) by mouth every 2 (two) hours as needed for pain (shortness of breath).      0   . nitroGLYCERIN (NITROSTAT) 0.4 MG SL tablet   Sublingual   Place 0.4 mg under the tongue every 5 (five) minutes as needed.           . polyethylene glycol powder (GLYCOLAX/MIRALAX) powder      use as directed daily   527 g   1   . predniSONE (DELTASONE) 10 MG tablet      Take 4 daily for 5 days then one daily thereafter   30 tablet   11   . prochlorperazine (COMPAZINE) 10 MG tablet      Take 1 tablet by mouth every 6 hours as needed for nausea and vomiting         . valsartan (DIOVAN) 160 MG tablet      0.5 tablet once daily   90 tablet   3     BP 184/63  Pulse 79  Temp(Src) 98.5 F (36.9 C) (Oral)  Resp 19  SpO2 99%  Physical Exam 1845: Physical examination:  Nursing notes reviewed; Vital signs and O2 SAT reviewed;  Constitutional: Well developed, Well nourished, Uncomfortable appearing; Head:  Normocephalic, atraumatic; Eyes: EOMI, PERRL, No scleral icterus; ENMT: Mouth and pharynx normal, Mucous membranes dry; Neck: Supple, Full range of motion, No lymphadenopathy; Cardiovascular: Regular rate and rhythm, No gallop; Respiratory: Breath sounds diminished & equal bilaterally, scattered wheezes bilat. No audible wheezing. Tachypneic, +access mm use, speaking few words only; Chest: Nontender, Movement normal; Abdomen: Soft, Nontender, Nondistended, Normal bowel sounds; Genitourinary: No CVA tenderness; Extremities: Pulses normal,  +right shoulder tenderness to palp. No open wounds, no ecchymosis. Right clavicle NT. NT right elbow/wrist/hand. Pelvis stable, hips NT.  No calf edema or asymmetry.; Neuro: AA&Ox3, Major CN grossly intact.  Speech clear. No gross focal motor or sensory deficits in  extremities.; Skin: Color normal, Warm, Dry.   ED Course  Procedures    MDM  MDM Reviewed: nursing note, previous chart and vitals Reviewed previous: labs and ECG Interpretation: labs, ECG and x-ray      Date: 08/11/2012  Rate: 81  Rhythm: normal sinus rhythm  QRS Axis: normal  Intervals: normal  ST/T Wave abnormalities: normal  Conduction Disutrbances:none  Narrative Interpretation:   Old EKG Reviewed: unchanged; no significant changes from previous EKG dated 03/09/2001.  Results for orders placed during the hospital encounter of 08/11/12  TROPONIN I      Result Value Range   Troponin I <0.30  <0.30 ng/mL  BASIC METABOLIC PANEL      Result Value Range   Sodium 140  135 - 145 mEq/L   Potassium 4.0  3.5 - 5.1 mEq/L   Chloride 96  96 - 112 mEq/L   CO2 34 (*) 19 - 32 mEq/L   Glucose, Bld 165 (*) 70 - 99 mg/dL   BUN 12  6 - 23 mg/dL   Creatinine, Ser 4.09  0.50 - 1.10 mg/dL   Calcium 8.9  8.4 - 81.1 mg/dL   GFR calc non Af Amer 81 (*) >90 mL/min   GFR calc Af Amer >90  >90 mL/min  CBC WITH DIFFERENTIAL      Result Value Range   WBC 8.0  4.0 - 10.5 K/uL   RBC 3.49 (*) 3.87 - 5.11 MIL/uL   Hemoglobin 10.9 (*) 12.0 - 15.0 g/dL   HCT 91.4 (*) 78.2 - 95.6 %   MCV 97.1  78.0 - 100.0 fL   MCH 31.2  26.0 - 34.0 pg   MCHC 32.2  30.0 - 36.0 g/dL   RDW 21.3  08.6 - 57.8 %   Platelets 203  150 - 400 K/uL   Neutrophils Relative 78 (*) 43 - 77 %   Neutro Abs 6.3  1.7 - 7.7 K/uL   Lymphocytes Relative 11 (*) 12 - 46 %   Lymphs Abs 0.9  0.7 - 4.0 K/uL   Monocytes Relative 10  3 - 12 %   Monocytes Absolute 0.8  0.1 - 1.0 K/uL   Eosinophils Relative 1  0 - 5 %   Eosinophils Absolute 0.0  0.0 - 0.7 K/uL   Basophils  Relative 0  0 - 1 %   Basophils Absolute 0.0  0.0 - 0.1 K/uL  URINALYSIS, ROUTINE W REFLEX MICROSCOPIC      Result Value Range   Color, Urine YELLOW  YELLOW   APPearance CLEAR  CLEAR   Specific Gravity, Urine 1.014  1.005 - 1.030   pH 6.5  5.0 - 8.0   Glucose, UA NEGATIVE  NEGATIVE mg/dL   Hgb urine dipstick NEGATIVE  NEGATIVE   Bilirubin Urine NEGATIVE  NEGATIVE   Ketones, ur NEGATIVE  NEGATIVE mg/dL   Protein, ur NEGATIVE  NEGATIVE mg/dL   Urobilinogen, UA 0.2  0.0 - 1.0 mg/dL   Nitrite NEGATIVE  NEGATIVE   Leukocytes, UA NEGATIVE  NEGATIVE  PRO B NATRIURETIC PEPTIDE      Result Value Range   Pro B Natriuretic peptide (BNP) 976.9 (*) 0 - 450 pg/mL   Dg Chest 2 View 08/11/2012  *RADIOLOGY REPORT*  Clinical Data: Fall.  Cough and shortness of breath.  CHEST - 2 VIEW  Comparison: Report of chest x-ray 11/21/2001.  The images are no longer available.  Findings: The heart is enlarged.  There is no edema or effusion to suggest  failure.  Mild emphysematous changes are noted. Degenerative changes are present in the thoracic spine.  IMPRESSION:  1.  Cardiomegaly without failure. 2.  Mild emphysematous changes.   Original Report Authenticated By: Marin Roberts, M.D.    Dg Shoulder Right 08/11/2012  *RADIOLOGY REPORT*  Clinical Data: Fall, right shoulder and elbow pain  RIGHT SHOULDER - 2+ VIEW  Comparison: Concurrently obtained radiographs of the chest and elbow  Findings: Acute impacted and mildly comminuted fracture through the surgical neck of the humerus.  The humeral head remains located on the scapular Y view.  The bones are osteopenic.  Degenerative changes are noted at the acromioclavicular joint.  There is associated soft tissue swelling and probable hematoma formation in the deltoid.  No acute thoracic abnormality.  IMPRESSION: Acute impacted and mildly comminuted fracture through the surgical neck of the humerus.  The humeral head remains located.   Original Report Authenticated By:  Malachy Moan, M.D.    Dg Elbow Complete Right 08/11/2012  *RADIOLOGY REPORT*  Clinical Data: Fall, right shoulder and elbow pain  RIGHT ELBOW - COMPLETE 3+ VIEW  Comparison: Concurrently obtained radiographs of the shoulder  Findings: Multiple views of the elbow demonstrate no acute fracture, malalignment or elbow joint effusion.  Bony mineralization within normal limits for age.  No focal soft tissue swelling.  IMPRESSION: Negative radiographs of the elbow.   Original Report Authenticated By: Malachy Moan, M.D.     Results for FRAYA, UEDA (MRN [id number]) as of 08/11/2012 22:02  Ref. Range 07/18/2008 11:51 03/13/2011 10:46 08/11/2012 20:15  Hemoglobin Latest Range: 12.0-15.0 g/dL 41.3 24.4 (L) 01.0 (L)  HCT Latest Range: 36.0-46.0 % 34.5 (L) 33.5 (L) 33.9 (L)    2200:  Pt continues to c/o SOB, stating she is "always SOB" but "it's just worse than usual for the past week."  Resps continue tachypneic with access mm use, speaking in long phrases now, lungs diminished with scattered wheezes bilat.  Will dose another neb, IV solumedrol.  Sling placed for Fx right humerus, will dose IV pain meds.  Pt lives with elderly husband with dementia, walks with walker per baseline.  Needs admit for further nebs and PT consult for safety at home.    2345:  T/C to [facility]. Conley Rolls, case discussed, including:  HPI, pertinent PM/SHx, VS/PE, dx testing, ED course and treatment:  Agreeable to observation admit to [facility], requests he will come to ED for eval.        Laray Anger, DO 08/12/12 1455

## 2012-08-11 NOTE — ED Notes (Signed)
Per EMS pt is a hospice pt for COPD, wears 4L [place] at home, this evening tripped over nasal cannula cord falling on R shoulder, complaining of R shoulder pain, no deformity noted, placed in KED, nausea noted, 20G LAC started, 4mg  Zofran given and 50 mcg fentanyl given, 200 NS infused at this time.

## 2012-08-11 NOTE — ED Notes (Signed)
Patient transported to X-ray 

## 2012-08-11 NOTE — ED Notes (Signed)
ZOX:[id number]<AV> Expected date:08/11/12<BR> Expected time: 6:42 PM<BR> Means of arrival:Ambulance<BR> Comments:<BR> fall

## 2012-08-12 ENCOUNTER — Encounter (HOSPITAL_COMMUNITY): Payer: Self-pay | Admitting: *Deleted

## 2012-08-12 DIAGNOSIS — I119 Hypertensive heart disease without heart failure: Secondary | ICD-10-CM

## 2012-08-12 DIAGNOSIS — F4322 Adjustment disorder with anxiety: Secondary | ICD-10-CM

## 2012-08-12 MED ORDER — OXYCODONE HCL 5 MG PO TABS: 5.0000 mg | ORAL_TABLET | ORAL | Status: DC | PRN

## 2012-08-12 MED ORDER — DEXTROSE 5 % IV SOLN
500.0000 mg | INTRAVENOUS | Status: DC
  Administered 2012-08-13: 500 mg via INTRAVENOUS
  Filled 2012-08-12: qty 500

## 2012-08-12 MED ORDER — ALBUTEROL SULFATE (5 MG/ML) 0.5% IN NEBU: 2.5000 mg | INHALATION_SOLUTION | RESPIRATORY_TRACT | Status: DC | PRN

## 2012-08-12 MED ORDER — ENOXAPARIN SODIUM 40 MG/0.4ML ~~LOC~~ SOLN
40.0000 mg | Freq: Every day | SUBCUTANEOUS | Status: DC
  Administered 2012-08-12: 40 mg via SUBCUTANEOUS
  Filled 2012-08-12 (×2): qty 0.4

## 2012-08-12 MED ORDER — ALPRAZOLAM 1 MG PO TABS
1.0000 mg | ORAL_TABLET | Freq: Three times a day (TID) | ORAL | Status: DC
Start: 2012-08-12 — End: 2012-08-13
  Administered 2012-08-12 – 2012-08-13 (×3): 1 mg via ORAL
  Filled 2012-08-12 (×3): qty 1

## 2012-08-12 MED ORDER — IRBESARTAN 150 MG PO TABS
150.0000 mg | ORAL_TABLET | Freq: Every day | ORAL | Status: DC
  Administered 2012-08-12 – 2012-08-13 (×2): 150 mg via ORAL
  Filled 2012-08-12 (×2): qty 1

## 2012-08-12 MED ORDER — PAROXETINE HCL 10 MG PO TABS
10.0000 mg | ORAL_TABLET | Freq: Every day | ORAL | Status: DC
  Administered 2012-08-12 – 2012-08-13 (×2): 10 mg via ORAL
  Filled 2012-08-12 (×3): qty 1

## 2012-08-12 MED ORDER — POLYETHYLENE GLYCOL 3350 17 G PO PACK
1.0000 | PACK | Freq: Every day | ORAL | Status: DC
  Administered 2012-08-12: 17 g via ORAL

## 2012-08-12 MED ORDER — OXYCODONE HCL 5 MG PO TABS
5.0000 mg | ORAL_TABLET | ORAL | Status: DC | PRN
  Administered 2012-08-12 – 2012-08-13 (×3): 5 mg via ORAL
  Filled 2012-08-12 (×2): qty 1

## 2012-08-12 MED ORDER — FUROSEMIDE 40 MG PO TABS
40.0000 mg | ORAL_TABLET | Freq: Every day | ORAL | Status: DC
  Administered 2012-08-12 – 2012-08-13 (×2): 40 mg via ORAL
  Filled 2012-08-12 (×2): qty 1

## 2012-08-12 MED ORDER — SENNOSIDES-DOCUSATE SODIUM 8.6-50 MG PO TABS
1.0000 | ORAL_TABLET | Freq: Two times a day (BID) | ORAL | Status: DC
  Administered 2012-08-12: 1 via ORAL
  Administered 2012-08-13: 2 via ORAL
  Filled 2012-08-12 (×3): qty 2

## 2012-08-12 MED ORDER — ISOSORBIDE MONONITRATE ER 60 MG PO TB24
60.0000 mg | ORAL_TABLET | Freq: Every day | ORAL | Status: DC
  Administered 2012-08-12 – 2012-08-13 (×2): 60 mg via ORAL
  Filled 2012-08-12 (×2): qty 1

## 2012-08-12 MED ORDER — METHYLPREDNISOLONE SODIUM SUCC 125 MG IJ SOLR
60.0000 mg | Freq: Four times a day (QID) | INTRAMUSCULAR | Status: DC
  Administered 2012-08-12 – 2012-08-13 (×4): 60 mg via INTRAVENOUS
  Filled 2012-08-12 (×7): qty 0.96

## 2012-08-12 MED ORDER — METHYLPREDNISOLONE SODIUM SUCC 125 MG IJ SOLR
60.0000 mg | INTRAMUSCULAR | Status: DC
  Administered 2012-08-12 (×3): 60 mg via INTRAVENOUS
  Filled 2012-08-12 (×8): qty 0.96

## 2012-08-12 MED ORDER — BISACODYL 10 MG RE SUPP: 10.0000 mg | Freq: Every day | RECTAL | Status: DC | PRN

## 2012-08-12 MED ORDER — ACETAMINOPHEN 325 MG PO TABS
650.0000 mg | ORAL_TABLET | ORAL | Status: DC | PRN
  Administered 2012-08-12 – 2012-08-13 (×2): 650 mg via ORAL
  Filled 2012-08-12 (×3): qty 2

## 2012-08-12 MED ORDER — NITROGLYCERIN 0.4 MG SL SUBL
0.4000 mg | SUBLINGUAL_TABLET | SUBLINGUAL | Status: DC | PRN
  Administered 2012-08-12: 0.4 mg via SUBLINGUAL
  Filled 2012-08-12: qty 25

## 2012-08-12 MED ORDER — OXYCODONE HCL 5 MG PO TABS
5.0000 mg | ORAL_TABLET | ORAL | Status: DC | PRN
  Administered 2012-08-12: 5 mg via ORAL
  Filled 2012-08-12 (×2): qty 1

## 2012-08-12 MED ORDER — ALBUTEROL SULFATE (5 MG/ML) 0.5% IN NEBU
2.5000 mg | INHALATION_SOLUTION | RESPIRATORY_TRACT | Status: DC
  Administered 2012-08-12: 2.5 mg via RESPIRATORY_TRACT
  Filled 2012-08-12: qty 0.5

## 2012-08-12 MED ORDER — GUAIFENESIN 100 MG/5ML PO SOLN
200.0000 mg | Freq: Four times a day (QID) | ORAL | Status: DC | PRN
  Administered 2012-08-12: 200 mg via ORAL
  Filled 2012-08-12: qty 10

## 2012-08-12 MED ORDER — ARFORMOTEROL TARTRATE 15 MCG/2ML IN NEBU
15.0000 ug | INHALATION_SOLUTION | Freq: Two times a day (BID) | RESPIRATORY_TRACT | Status: DC
  Administered 2012-08-12 – 2012-08-13 (×3): 15 ug via RESPIRATORY_TRACT
  Filled 2012-08-12 (×5): qty 2

## 2012-08-12 MED ORDER — POLYVINYL ALCOHOL 1.4 % OP SOLN
1.0000 [drp] | Freq: Four times a day (QID) | OPHTHALMIC | Status: DC | PRN
  Filled 2012-08-12: qty 15

## 2012-08-12 MED ORDER — DEXTROMETHORPHAN POLISTIREX 30 MG/5ML PO LQCR
60.0000 mg | Freq: Every evening | ORAL | Status: DC | PRN
  Filled 2012-08-12: qty 10

## 2012-08-12 MED ORDER — PROCHLORPERAZINE MALEATE 10 MG PO TABS
10.0000 mg | ORAL_TABLET | Freq: Four times a day (QID) | ORAL | Status: DC | PRN
  Filled 2012-08-12: qty 1

## 2012-08-12 NOTE — Progress Notes (Signed)
Patient ID: [name], female   DOB: August 21, 1927, 79 y.o.   MRN: [id number]  [facility] PROGRESS NOTE  [name] NWG:[id number] DOB: 1928-01-03 DOA: 08/11/2012 PCP: Rene Paci, MD  Brief narrative: Pt is 79 y.o. female with endstage COPD on chronic steroid and oxygen, under hospice care, hx of HTN, osteoporosis, CAD, Von Willebrand disease, prior CVA, tripped over the oxygen cord, fell several hours prior to this admission, and hurt her right shoulder. In the ER, she was found to have a comminuted fracture of the right humerus. She also was having increase DOE, and had required neb txs along with IV solumedrol. She lives with her husband who is frail and unable to take care of her. Hospitalist was asked to admit her as she cannot perform her ADLs and to treat her more aggressively for her COPD.   Principal Problem:   Shoulder fracture, right - keep arm in sling for now - conservative management with analgesia as needed - OT evaluation - will ask ortho team for any additional recommendations  Active Problems:   CORONARY ARTERY DISEASE - stable clinically   End stage COPD - continue Solumedrol and Azithromycin day #2/5 - oxygen via [place], titrate to keep oxygen > 90%  Consultants:  Hospice team notified   Procedures/Studies: Dg Chest 2 View 08/11/2012  1.  Cardiomegaly without failure. 2.  Mild emphysematous changes.  Dg Shoulder Right 08/11/2012  Acute impacted and mildly comminuted fracture through the surgical neck of the humerus.  The humeral head remains located.    Dg Elbow Complete Right  08/11/2012  Negtive radiographs of the elbow  Antibiotics:  Azithromycin 02/19 -->  Code Status: DNR Family Communication: Pt at bedside Disposition Plan: Home when medically stable  HPI/Subjective: No events overnight. Pt reports pain in the right shoulder area and is unable to move it around.   Objective: Filed Vitals:   08/12/12 0519 08/12/12 0821 08/12/12  0832 08/12/12 1016  BP: 154/83   155/78  Pulse: 79   100  Temp: 98.6 F (37 C)   99.3 F (37.4 C)  TempSrc: Oral   Oral  Resp: 20   22  Height:      Weight:      SpO2: 94% 86% 94% 94%    Intake/Output Summary (Last 24 hours) at 08/12/12 1141 Last data filed at 08/12/12 0630  Gross per 24 hour  Intake      0 ml  Output    900 ml  Net   -900 ml    Exam:   General:  Pt is alert, follows commands appropriately, not in acute distress  Cardiovascular: Regular rate and rhythm, S1/S2, no murmurs, no rubs, no gallops  Respiratory: Decreased breath sounds at bases with mild end expiratory wheezing   Abdomen: Soft, non tender, non distended, bowel sounds present, no guarding  Extremities: Right arm in sling, pt unable to move around, radial pulses palpabel bilaterally   Neuro: Grossly nonfocal  Data Reviewed: Basic Metabolic Panel:  Recent Labs Lab 08/11/12 2015  NA 140  K 4.0  CL 96  CO2 34*  GLUCOSE 165*  BUN 12  CREATININE 0.64  CALCIUM 8.9   CBC:  Recent Labs Lab 08/11/12 2015  WBC 8.0  NEUTROABS 6.3  HGB 10.9*  HCT 33.9*  MCV 97.1  PLT 203   Cardiac Enzymes:  Recent Labs Lab 08/11/12 2015  TROPONINI <0.30    Scheduled Meds: . arformoterol  15 mcg Nebulization BID  .  azithromycin  500 mg Intravenous Q24H  . enoxaparin injection  40 mg Subcutaneous Daily  . furosemide  40 mg Oral Daily  . irbesartan  150 mg Oral Daily  . isosorbide mononitrate  60 mg Oral Daily  . methylPREDNISolone inj  60 mg Intravenous Q4H  . polyethylene glycol  1 packet Oral Daily   Continuous Infusions:    Debbora Presto, MD  [facility] Pager [phone]  If 7PM-7AM, please contact night-coverage [email or web address] Password [facility] 08/12/2012, 11:41 AM   LOS: 1 day

## 2012-08-12 NOTE — Care Management Note (Addendum)
    Page 1 of 2   08/13/2012     5:20:20 PM   CARE MANAGEMENT NOTE 08/13/2012  Patient:  [name], [name]   Account Number:  [id number]  Date Initiated:  08/12/2012  Documentation initiated by:  Colleen Can  Subjective/Objective Assessment:   DX RT HUMERUS FRACTURE  ENDSTAGE COPD    PT IS ACTIVE WITH HOME HOSPICE-[facility]; PT HAS HOME O2     Action/Plan:   Hospice is currently following patient . CM will f/u for needs   Anticipated DC Date:  08/15/2012   Anticipated DC Plan:  HOME W [facility] CARE  In-house referral  Clinical Social Worker  Hospice / Palliative Care      DC Planning Services  CM consult      [facility] Choice  Resumption Of Svcs/PTA Provider   Choice offered to / List presented to:  C-1 Patient        HH arranged  HH-1 RN  HH-2 PT  HH-3 OT      [facility] agency  HOSPICE AND PALLIATIVE CARE OF [place]   Status of service:  Completed, signed off Medicare Important Message given?  NA - LOS <3 / Initial given by admissions (If response is "NO", the following Medicare IM given date fields will be blank) Date Medicare IM given:   Date Additional Medicare IM given:    Discharge Disposition:  HOME W HOSPICE CARE  Per UR Regulation:  Reviewed for med. necessity/level of care/duration of stay  If discussed at Long Length of Stay Meetings, dates discussed:    Comments:

## 2012-08-12 NOTE — Progress Notes (Signed)
Clinical Social Work Department BRIEF PSYCHOSOCIAL ASSESSMENT 08/12/2012  Patient:  [name], [name]     Account Number:  [id number]     Admit date:  08/11/2012  Clinical Social Worker:  Candie Chroman  Date/Time:  08/12/2012 05:26 PM  Referred by:  CSW  Date Referred:  08/12/2012 Referred for  ALF Placement   Other Referral:   Interview type:  Patient Other interview type:    PSYCHOSOCIAL DATA Living Status:  FACILITY Admitted from facility:  [facility] Level of care:  Independent Living Primary support name:   Primary support relationship to patient:  FAMILY Degree of support available:   supportive    CURRENT CONCERNS Current Concerns  Post-Acute Placement   Other Concerns:    SOCIAL WORK ASSESSMENT / PLAN Pt is an 79 yr old female who recently moved into [place] with her spouse. CSW met with pt / niece / hospice csw to assist with d/c planning. Pt plans to return to independent Living at [facility] following hospital d/c. Pt and spouse are both planning to trans. from Independent Living to Assisted Livng when possible. CSW will complete FL2 prior to d/c. Nsg will request PPD be placed tonite.   Assessment/plan status:  Psychosocial Support/Ongoing Assessment of Needs Other assessment/ plan:   Information/referral to community resources:   Pt is currently being followed by [facility].    PATIENT'S/FAMILY'S RESPONSE TO PLAN OF CARE: Pt is looking forward to returning home. Spouse has dementia and she worries about him.   Cori Razor LCSW [phone]

## 2012-08-12 NOTE — ED Notes (Signed)
CALLED TO GIVE REPORT ON PT NURSE UNAVAILABLE WILL CALL BACK.

## 2012-08-12 NOTE — Progress Notes (Addendum)
Room 1620 - Chase Caller - [facility] RN Visit-R.Lasonia Casino RN  Related admission to [facility] diagnosis of COPD.  Pt is DNR code.    Pt alert & oriented, sitting up in lounge chair with feet elevated, with complaints of pain at 7/10 - has been medicated with Tylenol and pt states it has "not kicked in yet."    No family present.   Pt states she fell in her [place] @ [employer].  Per EPIC note, pt tripped over her oxygen tubing and fell on the right shoulder, sustaining an acute impacted and mildly comminuted fracture through the surgical neck of the right humerus.  Pt is in shoulder sling.  She is most concerned about her husband who lives in same [place] apartment and has dementia.  Per pt, her plan is to return home to [place] @ [facility] when discharged.   Pt also concerned about delivery from [employer] of portable-fill 02 system at her apartment.  LMOVM for [facility] DME specialist-will follow up.  Patient's home medication list is on shadow chart.   Please call [facility] @ [phone]- ask for RN Liaison or after hours,ask for on-call RN with any hospice needs.    Thank you.  Joneen Boers, RN  [facility]  Hospice Liaison

## 2012-08-12 NOTE — Consult Note (Addendum)
ORTHOPAEDIC CONSULTATION  REQUESTING PHYSICIAN: Dorothea Ogle, MD  Chief Complaint: Right shoulder pain  HPI: [name] is a 79 y.o. female who complains of  right shoulder pain after a fall. Pain is rated as moderate to severe. She has been on oxycodone 5 mg by mouth every 4, but indicates that this is not satisfactorily taking care of her pain. She does have diverticulitis as well, and has to be careful about bowel obstruction. The pain in her shoulder is located laterally, and radiates down the arm. She denies any loss of consciousness in the fall. She normally takes care of her husband at home, who apparently has advanced dementia. She denies pain in any other location in her body.  Past Medical History  Diagnosis Date  . Von Willebrand's disease   . CORONARY ARTERY DISEASE   . DIVERTICULOSIS, COLON   . CEREBROVASCULAR ACCIDENT, HX OF 1991  . SMALL BOWEL OBSTRUCTION, HX OF   . CAROTID ENDARTERECTOMY, HX OF 06/23/1992  . COPD     end stage, O2 dep  . OSTEOPOROSIS   . HYPERTENSION   . GERD   . Depression   . Hyperlipidemia    Past Surgical History  Procedure Laterality Date  . Cholecystectomy  1965  . Appendectomy  1965  . Exploratory laparotomy  1965    peritonitis due to ruptured appx   History   Social History  . Marital Status: Married    Spouse Name: N/A    Number of Children: N/A  . Years of Education: N/A   Social History Main Topics  . Smoking status: Former Smoker -- 1.50 packs/day for 40 years    Quit date: 06/23/1988  . Smokeless tobacco: Never Used  . Alcohol Use: Yes     Comment: 2 glass each night  . Drug Use: No  . Sexually Active: Not Currently   Other Topics Concern  . None   Social History Narrative  . None   Family History  Problem Relation Age of Onset  . Arthritis Mother   . Arthritis Father   . Heart disease Father   . Hyperlipidemia Father   . Hypertension Father    Allergies  Allergen Reactions  . Aspirin      Patient has von Willebrand's disease.  Aspirin prolonged bleeding time  . Ciprofloxacin   . Hctz (Hydrochlorothiazide)     rash  . Klotrix (Potassium Chloride)   . Other     Powder on gloves,avoids ASA re-prolonged PTT  . Pravachol     LFT  . Sulfa Antibiotics   . Tetracyclines & Related     Vaginal rash   Prior to Admission medications   Medication Sig Start Date End Date Taking? Authorizing Provider  acetaminophen (TYLENOL) 500 MG tablet Take 1,000 mg by mouth every 6 (six) hours as needed for pain.   Yes Historical Provider, MD  albuterol (PROVENTIL HFA;VENTOLIN HFA) 108 (90 BASE) MCG/ACT inhaler Inhale 2 puffs into the lungs every 4 (four) hours as needed for shortness of breath.   Yes Historical Provider, MD  ALPRAZolam Prudy Feeler) 1 MG tablet Take 1 mg by mouth every 4 (four) hours as needed for anxiety.   Yes Historical Provider, MD  ALPRAZolam Prudy Feeler) 1 MG tablet Take 1 mg by mouth 3 (three) times daily.   Yes Historical Provider, MD  amLODipine (NORVASC) 5 MG tablet Take 5 mg by mouth daily. Hold if SBP < 100.   Yes Historical Provider, MD  arformoterol Rosalyn Gess)  15 MCG/2ML NEBU Take 15 mcg by nebulization 2 (two) times daily.   Yes Historical Provider, MD  dextromethorphan (DELSYM) 30 MG/5ML liquid Take 60 mg by mouth every 12 (twelve) hours as needed for cough.   Yes Historical Provider, MD  esomeprazole (NEXIUM) 40 MG capsule Take 40 mg by mouth daily before breakfast.   Yes Historical Provider, MD  furosemide (LASIX) 40 MG tablet Take 1 tablet (40 mg total) by mouth daily. 06/17/12  Yes Cassell Clement, MD  guaiFENesin-dextromethorphan (ROBITUSSIN DM) 100-10 MG/5ML syrup Take 5-10 mLs by mouth 4 (four) times daily as needed for cough.   Yes Historical Provider, MD  ipratropium-albuterol (DUONEB) 0.5-2.5 (3) MG/3ML SOLN Take 3 mLs by nebulization every 3 (three) hours as needed (for shortness of breath.).   Yes Historical Provider, MD  isosorbide mononitrate (IMDUR) 60 MG 24 hr  tablet Take 1 tablet (60 mg total) by mouth daily. 07/22/12  Yes Cassell Clement, MD  lovastatin (ALTOPREV) 40 MG 24 hr tablet Take 40 mg by mouth daily.    Yes Historical Provider, MD  metoprolol succinate (TOPROL-XL) 25 MG 24 hr tablet Take 25 mg by mouth 2 (two) times daily.   Yes Historical Provider, MD  morphine 20 MG/5ML solution Take 10 mg by mouth every 4 (four) hours as needed for pain.   Yes Historical Provider, MD  nitroGLYCERIN (NITROSTAT) 0.4 MG SL tablet Place 0.4 mg under the tongue every 5 (five) minutes as needed for chest pain.   Yes Historical Provider, MD  PARoxetine (PAXIL) 10 MG tablet Take 10 mg by mouth daily.   Yes Historical Provider, MD  polyethylene glycol (MIRALAX / GLYCOLAX) packet Take 17 g by mouth daily.   Yes Historical Provider, MD  predniSONE (DELTASONE) 10 MG tablet Take 10 mg by mouth daily.   Yes Historical Provider, MD  prochlorperazine (COMPAZINE) 10 MG tablet Take 10 mg by mouth every 6 (six) hours as needed (for nausea.).   Yes Historical Provider, MD  senna-docusate (SENOKOT-S) 8.6-50 MG per tablet Take 1 tablet by mouth at bedtime.   Yes Historical Provider, MD  sodium chloride (OCEAN) 0.65 % nasal spray Place 1 spray into the nose as needed for congestion.   Yes Historical Provider, MD  valsartan (DIOVAN) 80 MG tablet Take 40 mg by mouth daily.    Yes Historical Provider, MD  Vitamins A & D (VITAMIN A & D) ointment Apply 1 application topically as needed (for bed sores.).    Yes Historical Provider, MD   Dg Chest 2 View  08/11/2012  *RADIOLOGY REPORT*  Clinical Data: Fall.  Cough and shortness of breath.  CHEST - 2 VIEW  Comparison: Report of chest x-ray 11/21/2001.  The images are no longer available.  Findings: The heart is enlarged.  There is no edema or effusion to suggest failure.  Mild emphysematous changes are noted. Degenerative changes are present in the thoracic spine.  IMPRESSION:  1.  Cardiomegaly without failure. 2.  Mild emphysematous  changes.   Original Report Authenticated By: Marin Roberts, M.D.    Dg Shoulder Right  08/11/2012  *RADIOLOGY REPORT*  Clinical Data: Fall, right shoulder and elbow pain  RIGHT SHOULDER - 2+ VIEW  Comparison: Concurrently obtained radiographs of the chest and elbow  Findings: Acute impacted and mildly comminuted fracture through the surgical neck of the humerus.  The humeral head remains located on the scapular Y view.  The bones are osteopenic.  Degenerative changes are noted at the acromioclavicular joint.  There  is associated soft tissue swelling and probable hematoma formation in the deltoid.  No acute thoracic abnormality.  IMPRESSION: Acute impacted and mildly comminuted fracture through the surgical neck of the humerus.  The humeral head remains located.   Original Report Authenticated By: Malachy Moan, M.D.    Dg Elbow Complete Right  08/11/2012  *RADIOLOGY REPORT*  Clinical Data: Fall, right shoulder and elbow pain  RIGHT ELBOW - COMPLETE 3+ VIEW  Comparison: Concurrently obtained radiographs of the shoulder  Findings: Multiple views of the elbow demonstrate no acute fracture, malalignment or elbow joint effusion.  Bony mineralization within normal limits for age.  No focal soft tissue swelling.  IMPRESSION: Negative radiographs of the elbow.   Original Report Authenticated By: Malachy Moan, M.D.     Positive ROS: All other systems have been reviewed and were otherwise negative with the exception of those mentioned in the HPI and as above.  Physical Exam: General: Alert, no acute distress Cardiovascular: No pedal edema Respiratory: Mild use of accessory musculature with respiration GI: Abdomen is slightly distended, but soft, no rebound or guarding Skin: No lesions in the area of chief complaint, with the exception of bruising Neurologic: Sensation intact distally Psychiatric: Patient is competent for consent with normal mood and affect Lymphatic: No axillary or cervical  lymphadenopathy, although she indicates that she thinks that she has some type of lipomas in her cervical region  MUSCULOSKELETAL: Right hand has sensation intact throughout. All fingers flex extend and abduct. Her elbow is nontender. Her shoulder has extreme tenderness and cannot abduct.  Assessment: Right proximal humerus fracture with multiple coexisting risk factors including COPD, coronary disease, diverticulitis, among others listed above.  Plan: This is an acute severe injury, and then the significantly impair her function, and use of that upper extremity, as well as her independence. I discussed the options with her, and given the fact that the fracture is minimally displaced, recommended nonsurgical management with a sling, and I will also get physical therapy to work with her on ambulation, as well as occupational therapy for activities of daily living, use of the right hand and elbow, but no lifting with the shoulder. She can be in a sling for approximately 6 weeks, and come back and see me in 2 weeks, and we will get a new set of x-rays to make sure that she does not displace her fracture.  She is also did have to be very careful from the narcotic utilization standpoint, particularly with her history of bowel obstruction and diverticulitis. I have increased her pain medication from 5 mg of oxycodone every 4 hours to 5-10 mg, due to the inadequate control of her current pain level.  Will also increase bowel regimen, this is important.    Thank you for this consultation.    Aylyn Wenzler P, MD Cell [phone] Pager [phone]  08/12/2012 8:00 PM

## 2012-08-12 NOTE — H&P (Signed)
[facility] History and Physical  [name] ZOX:[id number] DOB: 1928/02/06    PCP:   Rene Paci, MD   Chief Complaint:  Larey Seat and fracture right shoulder.  HPI: [name] is an 79 y.o. female with endstage COPD on chronic steroid and oxygen, under hospice care, hx of HTN, osteoporosis, CAD, Von Willebrand disease, prior CVA, tripped over the oxygen cord, fell, and hurt her right shoulder.  In the ER, she was found to have a comminuted fracture of the right humerus.  She also was having increase DOE, and had required neb txs along with IV solumedrol.  She lives with her husband who is frail and unable to take care of her.  Hospitalist was asked to admit her as she cannot perform her ADLs and to treat her more aggressively for her COPD.    Rewiew of Systems:  Constitutional: Negative for malaise, fever and chills. No significant weight loss or weight gain Eyes: Negative for eye pain, redness and discharge, diplopia, visual changes, or flashes of light. ENMT: Negative for ear pain, hoarseness, nasal congestion, sinus pressure and sore throat. No headaches; tinnitus, drooling, or problem swallowing. Cardiovascular: Negative for chest pain, palpitations, diaphoresis, and peripheral edema. ; No orthopnea, PND Respiratory: Negative for cough, hemoptysis,  and stridor. No pleuritic chestpain. Gastrointestinal: Negative for nausea, vomiting, diarrhea, constipation, abdominal pain, melena, blood in stool, hematemesis, jaundice and rectal bleeding.    Genitourinary: Negative for frequency, dysuria, incontinence,flank pain and hematuria; Musculoskeletal: Negative for back pain and neck pain.  Skin: . Negative for pruritus, rash, abrasions, bruising and skin lesion.; ulcerations Neuro: Negative for headache, lightheadedness and neck stiffness. Negative for weakness, altered level of consciousness , altered mental status, extremity weakness, burning feet, involuntary movement,  seizure and syncope.  Psych: negative for anxiety, depression, insomnia, tearfulness, panic attacks, hallucinations, paranoia, suicidal or homicidal ideation   Past Medical History  Diagnosis Date  . Von Willebrand's disease   . CORONARY ARTERY DISEASE   . DIVERTICULOSIS, COLON   . CEREBROVASCULAR ACCIDENT, HX OF 1991  . SMALL BOWEL OBSTRUCTION, HX OF   . CAROTID ENDARTERECTOMY, HX OF 06/23/1992  . COPD     end stage, O2 dep  . OSTEOPOROSIS   . HYPERTENSION   . GERD   . Depression   . Hyperlipidemia     Past Surgical History  Procedure Laterality Date  . Cholecystectomy  1965  . Appendectomy  1965  . Exploratory laparotomy  1965    peritonitis due to ruptured appx    Medications:  HOME MEDS: Prior to Admission medications   Medication Sig Start Date End Date Taking? Authorizing Provider  acetaminophen (TYLENOL) 325 MG tablet Take 650 mg by mouth every 6 (six) hours as needed.      Historical Provider, MD  albuterol (PROVENTIL) (2.5 MG/3ML) 0.083% nebulizer solution Use four times daily as needed 07/10/11   Storm Frisk, MD  arformoterol (BROVANA) 15 MCG/2ML NEBU Take 2 mLs (15 mcg total) by nebulization 2 (two) times daily. 1-2 times daily 02/20/12   Storm Frisk, MD  dextromethorphan (DELSYM) 30 MG/5ML liquid Take 60 mg by mouth as needed.      Historical Provider, MD  furosemide (LASIX) 40 MG tablet Take 1 tablet (40 mg total) by mouth daily. 06/17/12   Cassell Clement, MD  guaifenesin (ROBITUSSIN) 100 MG/5ML syrup Take 200 mg by mouth as needed.      Historical Provider, MD  hydroxypropyl methylcellulose (ISOPTO TEARS) 2.5 % ophthalmic solution  1 drop as needed.      Historical Provider, MD  isosorbide mononitrate (IMDUR) 60 MG 24 hr tablet Take 1 tablet (60 mg total) by mouth daily. 07/22/12   Cassell Clement, MD  morphine 20 MG/5ML solution Take 0.6 mLs (2.4 mg total) by mouth every 2 (two) hours as needed for pain (shortness of breath). 10/15/11   Newt Lukes, MD  nitroGLYCERIN (NITROSTAT) 0.4 MG SL tablet Place 0.4 mg under the tongue every 5 (five) minutes as needed.      Historical Provider, MD  polyethylene glycol powder (GLYCOLAX/MIRALAX) powder use as directed daily 07/01/12   Storm Frisk, MD  predniSONE (DELTASONE) 10 MG tablet Take 4 daily for 5 days then one daily thereafter 12/11/11   Storm Frisk, MD  prochlorperazine (COMPAZINE) 10 MG tablet Take 1 tablet by mouth every 6 hours as needed for nausea and vomiting    Historical Provider, MD  valsartan (DIOVAN) 160 MG tablet 0.5 tablet once daily 04/02/12   Vesta Mixer, MD     Allergies:  Allergies  Allergen Reactions  . Aspirin     Patient has von Willebrand's disease.  Aspirin prolonged bleeding time  . Ciprofloxacin   . Hctz (Hydrochlorothiazide)     rash  . Klotrix (Potassium Chloride)   . Other     Powder on gloves,avoids ASA re-prolonged PTT  . Pravachol     LFT  . Sulfa Antibiotics   . Tetracyclines & Related     Vaginal rash    Social History:   reports that she quit smoking about 24 years ago. She has never used smokeless tobacco. She reports that  drinks alcohol. She reports that she does not use illicit drugs.  Family History: Family History  Problem Relation Age of Onset  . Arthritis Mother   . Arthritis Father   . Heart disease Father   . Hyperlipidemia Father   . Hypertension Father      Physical Exam: Filed Vitals:   08/11/12 1903 08/11/12 2000 08/11/12 2041 08/11/12 2328  BP: 202/66 184/63  160/52  Pulse: 76 79  93  Temp: 98.5 F (36.9 C)   98.7 F (37.1 C)  TempSrc: Oral   Oral  Resp: 17 19  23   SpO2: 100% 100% 99% 95%   Blood pressure 160/52, pulse 93, temperature 98.7 F (37.1 C), temperature source Oral, resp. rate 23, SpO2 95.00%.  GEN:  Pleasant patient lying in the stretcher in no acute distress; cooperative with exam. PSYCH:  alert and oriented x4; does not appear anxious or depressed; affect is  appropriate. HEENT: Mucous membranes pink and anicteric; PERRLA; EOM intact; no cervical lymphadenopathy nor thyromegaly or carotid bruit; no JVD; There were no stridor. Neck is very supple. Breasts:: Not examined CHEST WALL: No tenderness CHEST: severely shallow breaths, moderate wheezing both insp and exp. HEART: Regular rate and rhythm.  There are no murmur, rub, or gallops.   BACK: No kyphosis or scoliosis; no CVA tenderness ABDOMEN: soft and non-tender; no masses, no organomegaly, normal abdominal bowel sounds; no pannus; no intertriginous candida. There is no rebound and no distention. Rectal Exam: Not done EXTREMITIES: No bone or joint deformity; age-appropriate arthropathy of the hands and knees; no edema; no ulcerations.  There is no calf tenderness.  Right shoulder is in the sling. Genitalia: not examined PULSES: 2+ and symmetric SKIN: Normal hydration no rash or ulceration CNS: Cranial nerves 2-12 grossly intact no focal lateralizing neurologic deficit.  Speech is  fluent; uvula elevated with phonation, facial symmetry and tongue midline. DTR are normal bilaterally, cerebella exam is intact, barbinski is negative and strengths are equaled bilaterally.  No sensory loss.   Labs on Admission:  Basic Metabolic Panel:  Recent Labs Lab 08/11/12 2015  NA 140  K 4.0  CL 96  CO2 34*  GLUCOSE 165*  BUN 12  CREATININE 0.64  CALCIUM 8.9   Liver Function Tests: No results found for this basename: AST, ALT, ALKPHOS, BILITOT, PROT, ALBUMIN,  in the last 168 hours No results found for this basename: LIPASE, AMYLASE,  in the last 168 hours No results found for this basename: AMMONIA,  in the last 168 hours CBC:  Recent Labs Lab 08/11/12 2015  WBC 8.0  NEUTROABS 6.3  HGB 10.9*  HCT 33.9*  MCV 97.1  PLT 203   Cardiac Enzymes:  Recent Labs Lab 08/11/12 2015  TROPONINI <0.30    CBG: No results found for this basename: GLUCAP,  in the last 168 hours   Radiological Exams  on Admission: Dg Chest 2 View  08/11/2012  *RADIOLOGY REPORT*  Clinical Data: Fall.  Cough and shortness of breath.  CHEST - 2 VIEW  Comparison: Report of chest x-ray 11/21/2001.  The images are no longer available.  Findings: The heart is enlarged.  There is no edema or effusion to suggest failure.  Mild emphysematous changes are noted. Degenerative changes are present in the thoracic spine.  IMPRESSION:  1.  Cardiomegaly without failure. 2.  Mild emphysematous changes.   Original Report Authenticated By: Marin Roberts, M.D.    Dg Shoulder Right  08/11/2012  *RADIOLOGY REPORT*  Clinical Data: Fall, right shoulder and elbow pain  RIGHT SHOULDER - 2+ VIEW  Comparison: Concurrently obtained radiographs of the chest and elbow  Findings: Acute impacted and mildly comminuted fracture through the surgical neck of the humerus.  The humeral head remains located on the scapular Y view.  The bones are osteopenic.  Degenerative changes are noted at the acromioclavicular joint.  There is associated soft tissue swelling and probable hematoma formation in the deltoid.  No acute thoracic abnormality.  IMPRESSION: Acute impacted and mildly comminuted fracture through the surgical neck of the humerus.  The humeral head remains located.   Original Report Authenticated By: Malachy Moan, M.D.    Dg Elbow Complete Right  08/11/2012  *RADIOLOGY REPORT*  Clinical Data: Fall, right shoulder and elbow pain  RIGHT ELBOW - COMPLETE 3+ VIEW  Comparison: Concurrently obtained radiographs of the shoulder  Findings: Multiple views of the elbow demonstrate no acute fracture, malalignment or elbow joint effusion.  Bony mineralization within normal limits for age.  No focal soft tissue swelling.  IMPRESSION: Negative radiographs of the elbow.   Original Report Authenticated By: Malachy Moan, M.D.      Assessment/Plan Present on Admission:  . End stage COPD . Von Willebrand's disease . OSTEOPOROSIS . CORONARY ARTERY  DISEASE . Hyperlipidemia +++  Right shoulder Fx   PLAN:  Will admit her for right shoulder Fx.  Please consult orthopedics in the am.  I will continue her on pain meds.  For her COPD, will continue with frequent nebs, IV steroids, and IV Levaquin.  Her CAD, HTN, and hyperlipidemia are stable, and I will continue her meds.  Palliative care consult along with social service consult tomorrow would be appropriate.   I have confirmed that she wishes DNR/DNI, and we will honor her wishes.  She is stable and will be admitted  to [facility] service.  Thank you for allowing me to partake in the care of your nice patient.  Other plans as per orders.  Code Status: DNR.   Houston Siren, MD. [facility] Pager [phone] 7pm to 7am.  08/12/2012, 12:12 AM

## 2012-08-13 LAB — BASIC METABOLIC PANEL
Chloride: 94 mEq/L — ABNORMAL LOW (ref 96–112)
GFR calc Af Amer: >90 mL/min (ref >90)
Potassium: 3.6 mEq/L (ref 3.5–5.1)
Sodium: 138 mEq/L (ref 135–145)

## 2012-08-13 LAB — URINE CULTURE

## 2012-08-13 LAB — CBC
Platelets: 216 10*3/uL (ref 150–400)
RDW: 12.8 % (ref 11.5–15.5)
WBC: 11.9 10*3/uL — ABNORMAL HIGH (ref 4.0–10.5)

## 2012-08-13 MED ORDER — AZITHROMYCIN 250 MG PO TABS: 250.0000 mg | ORAL_TABLET | Freq: Every day | ORAL | Status: DC

## 2012-08-13 MED ORDER — OXYCODONE HCL 5 MG PO TABS: 5.0000 mg | ORAL_TABLET | ORAL | Status: DC | PRN

## 2012-08-13 MED ORDER — TUBERCULIN PPD 5 UNIT/0.1ML ID SOLN
5.0000 [IU] | Freq: Once | INTRADERMAL | Status: DC
  Administered 2012-08-13: 5 [IU] via INTRADERMAL
  Filled 2012-08-13: qty 0.1

## 2012-08-13 NOTE — Evaluation (Signed)
Occupational Therapy Evaluation Patient Details Name: [name] MRN: [id number] DOB: 03/28/1928 Today's Date: 08/13/2012 Time: 7829-5621 OT Time Calculation (min): 24 min  OT Assessment / Plan / Recommendation Clinical Impression  Pt presents to OT s/p fall in which pt sustained a R shoulder fracture. Pt with decreased I with ADL activity and will benefit from skilled OT to increase I with ADL acitivity., decrease chance of falls and increase safety in her apartment    OT Assessment  Patient needs continued OT Services    Follow Up Recommendations  Home health OT    Barriers to Discharge Decreased caregiver support pt needs increased A in her apartment        Frequency  Min 2X/week    Precautions / Restrictions Precautions Precautions: Shoulder;Fall Type of Shoulder Precautions: sling, NWB and oxygen Restrictions Weight Bearing Restrictions: Yes RUE Weight Bearing: Non weight bearing       ADL  Eating/Feeding: Simulated Where Assessed - Eating/Feeding: Chair Grooming: Performed;Wash/dry face;Teeth care;Min guard Where Assessed - Grooming: Unsupported standing Upper Body Bathing: Simulated;Moderate assistance Where Assessed - Upper Body Bathing: Unsupported sitting Lower Body Bathing: Simulated;Maximal assistance Where Assessed - Lower Body Bathing: Supported sit to stand Upper Body Dressing: Simulated;Moderate assistance Where Assessed - Upper Body Dressing: Supported sit to stand Lower Body Dressing: Maximal assistance Where Assessed - Lower Body Dressing: Unsupported sit to stand Toilet Transfer: Simulated;Moderate assistance Toilet Transfer Method: Sit to stand Toileting - Clothing Manipulation and Hygiene: Simulated;Moderate assistance Where Assessed - Toileting Clothing Manipulation and Hygiene: Standing ADL Comments: Pt will need significant A with ADL activity upon DC.  Pt understands and agrees    OT Diagnosis: Generalized weakness  OT Problem List:  Decreased strength;Decreased activity tolerance;Decreased range of motion;Impaired balance (sitting and/or standing);Decreased safety awareness;Decreased knowledge of precautions;Impaired UE functional use OT Treatment Interventions: Self-care/ADL training;Therapeutic activities;Patient/family education       Visit Information  Last OT Received On: 08/13/12    Subjective Data  Subjective: i live in I living but need to transition to ALF   Prior Functioning     Home Living Lives With: Spouse;Other (Comment) (spouse has dementia) Type of Home: Independent living facility Bathroom Toilet: Handicapped height Home Adaptive Equipment: Walker - rolling Prior Function Level of Independence: Independent with assistive device(s) Communication Communication: No difficulties Dominant Hand: Right         Vision/Perception Vision - History Patient Visual Report: No change from baseline   Cognition  Cognition Overall Cognitive Status: Appears within functional limits for tasks assessed/performed Arousal/Alertness: Awake/alert Orientation Level: Appears intact for tasks assessed Behavior During Session: [facility] for tasks performed    Extremity/Trunk Assessment Right Upper Extremity Assessment RUE ROM/Strength/Tone: Deficits RUE ROM/Strength/Tone Deficits: due to fracture Left Upper Extremity Assessment LUE ROM/Strength/Tone: Deficits LUE ROM/Strength/Tone Deficits: decreased shoulder flexion, but pt able to perform simple ADL activity     Mobility Transfers Transfers: Sit to Stand;Stand to Sit Sit to Stand: 3: Mod assist Stand to Sit: 3: Mod assist           End of Session OT - End of Session Equipment Utilized During Treatment: Other (comment) (arm sling) Activity Tolerance: Patient tolerated treatment well Patient left: in chair;Other (comment) (with PT)  GO     Willene Holian, [place]ung 08/13/2012, 10:27 AM

## 2012-08-13 NOTE — Discharge Summary (Signed)
Physician Discharge Summary  FALISA LAMORA AVW:[id number] DOB: [birth year] DOA: 08/11/2012  PCP: Rene Paci, MD  Admit date: 08/11/2012 Discharge date: 08/13/2012  Recommendations for Outpatient Follow-up:  1. Pt will need to follow up with PCP in 2-3 weeks post discharge if needed 2. Please note that patient will see orthopedic specialists in 2 weeks to reevaluate right humerus fracture, this was recommended by ortho specialist 3. Per orthopedic specialists, patient will need to keep an arm in the sling for 6 weeks 4. Patient will need to have home health PT, OT RN, assistance as possible 5. Patient is currently under hospice team care 6. Please note that use of narcotics for adequate pain control and bowel regimen has been discussed with pt 7. Patient will be discharged on Zithromax to complete therapy for 4 more days post-discharge  Discharge Diagnoses:   Shoulder fracture, right Principal Problem:   Shoulder fracture, right Active Problems:   Von Willebrand's disease   CORONARY ARTERY DISEASE   OSTEOPOROSIS   Hyperlipidemia   End stage COPD  Discharge Condition: Stable  Diet recommendation: Heart healthy diet discussed in details   Brief narrative:  Pt is 79 y.o. female with endstage COPD on chronic steroid and oxygen, under hospice care, hx of HTN, osteoporosis, CAD, Von Willebrand disease, prior CVA, tripped over the oxygen cord, fell several hours prior to this admission, and hurt her right shoulder. In the ER, she was found to have a comminuted fracture of the right humerus. She also was having increase DOE, and had required neb txs along with IV solumedrol. She lives with her husband who is frail and unable to take care of her. Hospitalist was asked to admit her as she cannot perform her ADLs and to treat her more aggressively for her COPD.   Principal Problem:  Shoulder fracture, right  - keep arm in sling for now  - conservative management with analgesia as  needed  - Appreciate orthopedic specialist input - Awaiting PT, OT evaluation - Social worker consulted to help with assistance in obtaining home health PT, OT, RN Active Problems:  CORONARY ARTERY DISEASE  - stable clinically  End stage COPD  - Patient will continue taking daily dose of prednisone 10 mg by mouth - We will also prescribe Zithromax to complete therapy for 4 more days upon discharge, this will complete 7 days of therapy  Consultants:  Hospice team notified  Procedures/Studies:  Dg Chest 2 View 08/11/2012 1. Cardiomegaly without failure. 2. Mild emphysematous changes.  Dg Shoulder Right 08/11/2012 Acute impacted and mildly comminuted fracture through the surgical neck of the humerus. The humeral head remains located.  Dg Elbow Complete Right 08/11/2012 Negtive radiographs of the elbow Antibiotics:  Azithromycin 02/19 --> 08/16/2012  Code Status: DNR   Discharge Exam: Filed Vitals:   08/13/12 0518  BP: 179/99  Pulse: 109  Temp: 98.2 F (36.8 C)  Resp: 20   Filed Vitals:   08/12/12 2138 08/12/12 2226 08/13/12 0518 08/13/12 0922  BP:  177/61 179/99   Pulse:  100 109   Temp:  99 F (37.2 C) 98.2 F (36.8 C)   TempSrc:  Oral Oral   Resp:  20 20   Height:      Weight:      SpO2: 96% 94% 92% 94%    General: Pt is alert, follows commands appropriately, not in acute distress Cardiovascular: Regular rhythm, tachycardic, S1/S2 +, no murmurs, no rubs, no gallops Respiratory: Clear to auscultation bilaterally, no wheezing,  no crackles, no rhonchi Abdominal: Soft, non tender, non distended, bowel sounds +, no guarding Extremities: right arm in the sling  Neuro: Grossly nonfocal  Discharge Instructions  Discharge Orders   Future Orders Complete By Expires     Diet - low sodium heart healthy  As directed     Increase activity slowly  As directed         Medication List    TAKE these medications       acetaminophen 500 MG tablet  Commonly known as:  TYLENOL   Take 1,000 mg by mouth every 6 (six) hours as needed for pain.     albuterol 108 (90 BASE) MCG/ACT inhaler  Commonly known as:  PROVENTIL HFA;VENTOLIN HFA  Inhale 2 puffs into the lungs every 4 (four) hours as needed for shortness of breath.     ALPRAZolam 1 MG tablet  Commonly known as:  XANAX  Take 1 mg by mouth every 4 (four) hours as needed for anxiety.     ALPRAZolam 1 MG tablet  Commonly known as:  XANAX  Take 1 mg by mouth 3 (three) times daily.     amLODipine 5 MG tablet  Commonly known as:  NORVASC  Take 5 mg by mouth daily. Hold if SBP < 100.     arformoterol 15 MCG/2ML Nebu  Commonly known as:  BROVANA  Take 15 mcg by nebulization 2 (two) times daily.     azithromycin 250 MG tablet  Commonly known as:  ZITHROMAX  Take 1 tablet (250 mg total) by mouth daily.     dextromethorphan 30 MG/5ML liquid  Commonly known as:  DELSYM  Take 60 mg by mouth every 12 (twelve) hours as needed for cough.     esomeprazole 40 MG capsule  Commonly known as:  NEXIUM  Take 40 mg by mouth daily before breakfast.     furosemide 40 MG tablet  Commonly known as:  LASIX  Take 1 tablet (40 mg total) by mouth daily.     guaiFENesin-dextromethorphan 100-10 MG/5ML syrup  Commonly known as:  ROBITUSSIN DM  Take 5-10 mLs by mouth 4 (four) times daily as needed for cough.     ipratropium-albuterol 0.5-2.5 (3) MG/3ML Soln  Commonly known as:  DUONEB  Take 3 mLs by nebulization every 3 (three) hours as needed (for shortness of breath.).     isosorbide mononitrate 60 MG 24 hr tablet  Commonly known as:  IMDUR  Take 1 tablet (60 mg total) by mouth daily.     lovastatin 40 MG 24 hr tablet  Commonly known as:  ALTOPREV  Take 40 mg by mouth daily.     metoprolol succinate 25 MG 24 hr tablet  Commonly known as:  TOPROL-XL  Take 25 mg by mouth 2 (two) times daily.     morphine 20 MG/5ML solution  Take 10 mg by mouth every 4 (four) hours as needed for pain.     nitroGLYCERIN 0.4 MG SL  tablet  Commonly known as:  NITROSTAT  Place 0.4 mg under the tongue every 5 (five) minutes as needed for chest pain.     oxyCODONE 5 MG immediate release tablet  Commonly known as:  Oxy IR/ROXICODONE  Take 1-2 tablets (5-10 mg total) by mouth every 4 (four) hours as needed.     PARoxetine 10 MG tablet  Commonly known as:  PAXIL  Take 10 mg by mouth daily.     polyethylene glycol packet  Commonly known as:  MIRALAX / GLYCOLAX  Take 17 g by mouth daily.     predniSONE 10 MG tablet  Commonly known as:  DELTASONE  Take 10 mg by mouth daily.     prochlorperazine 10 MG tablet  Commonly known as:  COMPAZINE  Take 10 mg by mouth every 6 (six) hours as needed (for nausea.).     senna-docusate 8.6-50 MG per tablet  Commonly known as:  Senokot-S  Take 1 tablet by mouth at bedtime.     sodium chloride 0.65 % nasal spray  Commonly known as:  OCEAN  Place 1 spray into the nose as needed for congestion.     valsartan 80 MG tablet  Commonly known as:  DIOVAN  Take 40 mg by mouth daily.     vitamin A & D ointment  Apply 1 application topically as needed (for bed sores.).           Follow-up Information   Follow up with Eulas Post, MD. Schedule an appointment as soon as possible for a visit in 2 weeks.   Contact information:   [address] [place] [address] [phone]       Follow up with Rene Paci, MD. (As needed if symptoms worsen)    Contact information:   [address] [place] [address] [phone]        The results of significant diagnostics from this hospitalization (including imaging, microbiology, ancillary and laboratory) are listed below for reference.     Microbiology: Recent Results (from the past 240 hour(s))  URINE CULTURE     Status: None   Collection Time    08/11/12  9:00 PM      Result Value Range Status   Specimen Description URINE, CLEAN CATCH   Final   Special Requests  NONE   Final   Culture  Setup Time 08/12/2012 05:21   Final   Colony Count 80,000 COLONIES/ML   Final   Culture     Final   Value: Multiple bacterial morphotypes present, none predominant. Suggest appropriate recollection if clinically indicated.   Report Status 08/13/2012 FINAL   Final     Labs: Basic Metabolic Panel:  Recent Labs Lab 08/11/12 2015 08/13/12 0420  NA 140 138  K 4.0 3.6  CL 96 94*  CO2 34* 37*  GLUCOSE 165* 210*  BUN 12 20  CREATININE 0.64 0.70  CALCIUM 8.9 9.2   CBC:  Recent Labs Lab 08/11/12 2015 08/13/12 0420  WBC 8.0 11.9*  NEUTROABS 6.3  --   HGB 10.9* 10.5*  HCT 33.9* 32.1*  MCV 97.1 96.4  PLT 203 216   Cardiac Enzymes:  Recent Labs Lab 08/11/12 2015  TROPONINI <0.30   BNP: BNP (last 3 results)  Recent Labs  08/11/12 2015  PROBNP 976.9*   SIGNED: Time coordinating discharge: Over 30 minutes  Debbora Presto, MD  [facility] 08/13/2012, 9:44 AM Pager [phone]  If 7PM-7AM, please contact night-coverage [email or web address] Password [email or web address]

## 2012-08-13 NOTE — Progress Notes (Signed)
1620 Joann, [name]  Ppd skin test applied to left forearm circled and dated 08/13/12  D Susann Givens RN

## 2012-08-13 NOTE — Progress Notes (Signed)
Patient ID: [name], female   DOB: [birth year], 79 y.o.   MRN: [id number]     Subjective:  Patient reports pain as mild to moderate.  Alert and aware of time and place follows commands.  Objective:   VITALS:   Filed Vitals:   08/12/12 2138 08/12/12 2226 08/13/12 0518 08/13/12 0922  BP:  177/61 179/99   Pulse:  100 109   Temp:  99 F (37.2 C) 98.2 F (36.8 C)   TempSrc:  Oral Oral   Resp:  20 20   Height:      Weight:      SpO2: 96% 94% 92% 94%    ABD soft Sensation intact distally Dorsiflexion/Plantar flexion intact Sling at all times to right upper ext   Lab Results  Component Value Date   WBC 11.9* 08/13/2012   HGB 10.5* 08/13/2012   HCT 32.1* 08/13/2012   MCV 96.4 08/13/2012   PLT 216 08/13/2012     Assessment/Plan:     Principal Problem:   Shoulder fracture, right Active Problems:   Von Willebrand's disease   CORONARY ARTERY DISEASE   OSTEOPOROSIS   Hyperlipidemia   End stage COPD   Advance diet Up with therapy Continue plan per medicine NWB right upper ext. Sling at all times.   Haskel Khan 08/13/2012, 10:15 AM   Teryl Lucy, MD Cell [phone] Pager [phone]

## 2012-08-13 NOTE — Progress Notes (Signed)
Room 1620 - Chase Caller - [facility] RN Visit-R.Leeland Lovelady RN  Related admission to [facility] diagnosis of COPD.  Pt is DNR code.    Pt alert & oriented, sitting up  in bedside lounge chair, with complaints of pain in R/shoulder easing up since pain med administered earlier.  Pt encouraged to be diligent with her bowel regimen due to prior bowel obstruction due to narcotics.- pt well aware of consequences.     No family present.   Pt scheduled for discharge back to AL @ [facility] - this is a level up from her IL prior to admission.  Per [facility] SW, pt to be evaluated by PT to ensure safety in travel home (pt anticipating travel by private car with her niece) and in AL.   Pt very concerned about her husband (with dementia) and pleased AL will assist with his care.   Pt's portable 02 set up to be delivered to [facility] today by Willette Cluster per [facility] DME specialist.  Patient's home medication list is on shadow chart.   Please call [facility] @ [phone]- ask for RN Liaison or after hours,ask for on-call RN with any hospice needs.    Thank you.  Joneen Boers, RN  [facility]  Hospice Liaison

## 2012-08-13 NOTE — Progress Notes (Addendum)
[facility] Social work note: Pt well-known to this LCSW from home care under hospice care, now in independent living for about a week. She and her husband, with dementia, moved to [facility] Independent living last Saturday.  This week, Pt fell, sustaining fx. Discussed d/c plans yesterday with administrator at [facility] , this am with [facility] social worker, PT, OT, RN case manager, Pt, niece/hcpoa.  [facility] assisted living bed available but room is being readied for Pt and her husband.  In the interim, as Pt is medically ready, she will return to independent living with 24 hour care until facility helps Pt and her husband move to new AL room B133. Hospice will continue to follow with RN, LCSW, CNA, chaplain. PT/OT will also follow due to orthopedic injury.  Discussed with [facility] social worker and ambulance transport being set up for 1 pm today. PPD skin test placed last pm; FL-2 prepared by Child psychotherapist.  Niece pursuing FL-2, etc for Pt's husband via MD so that the couple can remain together.  Addendum: Albin Felling, LCSW [facility]

## 2012-08-13 NOTE — Evaluation (Signed)
Physical Therapy Evaluation Patient Details Name: [name] MRN: [id number] DOB: [birth year] Today's Date: 08/13/2012 Time: 1000-1020 PT Time Calculation (min): 20 min  PT Assessment / Plan / Recommendation Clinical Impression  79 yo femlale admitted with R comminuted prox humerus fracture. hx of copd, cva and under hospice care. On eval pt required Min assist for all mobility. Demonstrates general weakness, decreased activity tolerance, and impaired dynamic balance. Pt is at high risk for falls and needs assist with all mobility tasks. Pts plan is to return to [facility] ILF for a few days before transitioning to ALF. Spoke with hospice rep who states that 24 hour S/A will be availabe those few days. Recommend HHPT and 24 hour supervision/assist.     PT Assessment  Patient needs continued PT services    Follow Up Recommendations  Home health PT;Supervision/Assistance - 24 hour    Does the patient have the potential to tolerate intense rehabilitation      Barriers to Discharge        Equipment Recommendations  Other (comment) (to be determined)    Recommendations for Other Services OT consult   Frequency Min 3X/week    Precautions / Restrictions Precautions Precautions: Fall;Shoulder Type of Shoulder Precautions: sling, NWB and oxygen Restrictions Weight Bearing Restrictions: Yes RUE Weight Bearing: Non weight bearing   Pertinent Vitals/Pain 5/10 R UE      Mobility  Transfers Transfers: Sit to Stand;Stand to Sit Sit to Stand: 4: Min assist;From chair/3-in-1;With armrests Stand to Sit: 4: Min assist;To chair/3-in-1;With armrests Details for Transfer Assistance: VCs safety, technique, hand placement. Assist to rise, stabilize, control descent Ambulation/Gait Ambulation/Gait Assistance: 4: Min assist Ambulation Distance (Feet): 20 Feet Assistive device: Small based quad cane Ambulation/Gait Assistance Details: VCs safety, technique, sequence, distance from  cane. Assist to stabilize pt and cane. Slow gait speed. Unsteady with giait. Fatigues easily. O2 92% on 4L  Gait Pattern: Step-to pattern;Trunk flexed;Decreased stride length;Decreased step length - left;Decreased step length - right    Exercises     PT Diagnosis: Difficulty walking;Abnormality of gait;Generalized weakness;Acute pain  PT Problem List: Decreased strength;Decreased activity tolerance;Decreased balance;Decreased mobility;Decreased knowledge of use of DME;Cardiopulmonary status limiting activity PT Treatment Interventions: DME instruction;Gait training;Functional mobility training;Therapeutic activities;Therapeutic exercise;Balance training;Patient/family education   PT Goals Acute Rehab PT Goals PT Goal Formulation: With patient Time For Goal Achievement: 08/20/12 Potential to Achieve Goals: Good Pt will go Supine/Side to Sit: with supervision PT Goal: Supine/Side to Sit - Progress: Goal set today Pt will go Sit to Supine/Side: with supervision PT Goal: Sit to Supine/Side - Progress: Goal set today Pt will go Sit to Stand: with supervision PT Goal: Sit to Stand - Progress: Goal set today Pt will Transfer Bed to Chair/Chair to Bed: with supervision PT Transfer Goal: Bed to Chair/Chair to Bed - Progress: Goal set today Pt will Ambulate: 51 - 150 feet;with supervision;with least restrictive assistive device PT Goal: Ambulate - Progress: Goal set today  Visit Information  Last PT Received On: 08/13/12 Assistance Needed: +1    Subjective Data  Subjective: I thought I could walk further than that Patient Stated Goal: home   Prior Functioning  Home Living Lives With: Spouse;Other (Comment) (spouse has dementia) Type of Home: Independent living facility Bathroom Toilet: Handicapped height Home Adaptive Equipment: Walker - rolling Prior Function Level of Independence: Independent with assistive device(s) Communication Communication: No difficulties Dominant Hand: Right     Cognition  Cognition Overall Cognitive Status: Appears within functional limits for tasks  assessed/performed Arousal/Alertness: Awake/alert Orientation Level: Appears intact for tasks assessed Behavior During Session: [facility] for tasks performed    Extremity/Trunk Assessment Right Upper Extremity Assessment RUE ROM/Strength/Tone: Deficits RUE ROM/Strength/Tone Deficits: due to fracture Left Upper Extremity Assessment LUE ROM/Strength/Tone: Deficits LUE ROM/Strength/Tone Deficits: decreased shoulder flexion, but pt able to perform simple ADL activity   Balance Balance Balance Assessed: Yes Static Standing Balance Static Standing - Balance Support: Left upper extremity supported Static Standing - Level of Assistance: 4: Min assist Dynamic Standing Balance Dynamic Standing - Balance Support: Left upper extremity supported Dynamic Standing - Level of Assistance: 4: Min assist  End of Session PT - End of Session Equipment Utilized During Treatment: Gait belt;Other (comment) (R UE sling) Activity Tolerance: Patient limited by fatigue Patient left: in chair;with call bell/phone within reach  GP     Rebeca Alert [facility] 08/13/2012, 11:37 AM [phone]

## 2012-08-16 ENCOUNTER — Telehealth: Payer: Self-pay | Admitting: General Practice

## 2012-08-16 NOTE — Telephone Encounter (Signed)
LMOM for pt to call and schedule follow up appointment with Dr. Felicity Coyer.

## 2012-08-16 NOTE — Telephone Encounter (Signed)
Left message with receptionist at [facility] for pt to return call to schedule follow up hospital visit with Dr. Felicity Coyer

## 2012-08-17 ENCOUNTER — Telehealth: Payer: Self-pay | Admitting: General Practice

## 2012-08-17 NOTE — Telephone Encounter (Signed)
Transitional care call:  Patient discharged from hospital on 2/21 to home at [facility] after having surgery on shoulder.  Patient lives with husband.  Hospice is involved with patient care.  Home health, PT, OT and RN have been ordered but according to patient she has not yet been contacted.  Patient states that she has hired help at [facility] to help with ADL's.   Patient states that she is wearing her sling and that her pain is under control.  She says that she quit taking oxycodone because it made her feel swimmy headed.  Patient states that she has no questions regarding discharge instructions or medications.  I reviewed medications with patient and pt states that she has all medicines in the home.  Patient denies any questions for Dr. Felicity Coyer.  I tried to made follow up appointment but pt states that she has no transportation at this time.  I did present some options such as a taxi or [facility] and pt would not commit.  Will follow up with patient at a later date.

## 2012-08-26 ENCOUNTER — Other Ambulatory Visit: Payer: Self-pay | Admitting: *Deleted

## 2012-08-26 MED ORDER — LOVASTATIN ER 40 MG PO TB24: 40.0000 mg | ORAL_TABLET | Freq: Every day | ORAL | Status: DC

## 2012-08-31 ENCOUNTER — Other Ambulatory Visit: Payer: Self-pay | Admitting: *Deleted

## 2012-08-31 MED ORDER — LOVASTATIN 40 MG PO TABS: 40.0000 mg | ORAL_TABLET | Freq: Every day | ORAL | Status: DC

## 2012-09-03 ENCOUNTER — Other Ambulatory Visit: Payer: Self-pay | Admitting: *Deleted

## 2012-09-03 DIAGNOSIS — E7800 Pure hypercholesterolemia, unspecified: Secondary | ICD-10-CM

## 2012-09-03 MED ORDER — LOVASTATIN 40 MG PO TABS: 40.0000 mg | ORAL_TABLET | Freq: Every day | ORAL | Status: DC

## 2012-09-06 ENCOUNTER — Telehealth: Payer: Self-pay | Admitting: Critical Care Medicine

## 2012-09-06 NOTE — Telephone Encounter (Signed)
alyson returned call. I asked to refax this again. She is going to fax it to [phone] since she has not had luck with the triage fax.

## 2012-09-06 NOTE — Telephone Encounter (Signed)
I do not see form and do not recall seeing. [email or web address] -- can she refax to triage?

## 2012-09-06 NOTE — Telephone Encounter (Signed)
Form was received and placed in PW lookat  LMOM to make [facility] aware that this was received and that we will fax back to her once done

## 2012-09-23 ENCOUNTER — Other Ambulatory Visit: Payer: Self-pay | Admitting: *Deleted

## 2012-09-23 MED ORDER — AMLODIPINE BESYLATE 5 MG PO TABS: 5.0000 mg | ORAL_TABLET | Freq: Every day | ORAL | Status: DC

## 2012-09-24 ENCOUNTER — Emergency Department (HOSPITAL_COMMUNITY): Payer: Medicare Other

## 2012-09-24 ENCOUNTER — Telehealth: Payer: Self-pay | Admitting: Pulmonary Disease

## 2012-09-24 ENCOUNTER — Encounter (HOSPITAL_COMMUNITY): Payer: Self-pay | Admitting: Vascular Surgery

## 2012-09-24 ENCOUNTER — Inpatient Hospital Stay (HOSPITAL_COMMUNITY)
Admission: EM | Admit: 2012-09-24 | Discharge: 2012-09-29 | DRG: 389 | Disposition: A | Discharge status: Home / Self Care | Payer: Medicare Other | Attending: Internal Medicine | Admitting: Internal Medicine

## 2012-09-24 DIAGNOSIS — S4291XD Fracture of right shoulder girdle, part unspecified, subsequent encounter for fracture with routine healing: Secondary | ICD-10-CM

## 2012-09-24 DIAGNOSIS — Z79899 Other long term (current) drug therapy: Secondary | ICD-10-CM

## 2012-09-24 DIAGNOSIS — D6800 Von Willebrand disease, unspecified: Secondary | ICD-10-CM | POA: Diagnosis present

## 2012-09-24 DIAGNOSIS — Z8673 Personal history of transient ischemic attack (TIA), and cerebral infarction without residual deficits: Secondary | ICD-10-CM

## 2012-09-24 DIAGNOSIS — S4291XA Fracture of right shoulder girdle, part unspecified, initial encounter for closed fracture: Secondary | ICD-10-CM

## 2012-09-24 DIAGNOSIS — F329 Major depressive disorder, single episode, unspecified: Secondary | ICD-10-CM | POA: Diagnosis present

## 2012-09-24 DIAGNOSIS — R0902 Hypoxemia: Secondary | ICD-10-CM | POA: Diagnosis present

## 2012-09-24 DIAGNOSIS — E876 Hypokalemia: Secondary | ICD-10-CM | POA: Diagnosis not present

## 2012-09-24 DIAGNOSIS — I509 Heart failure, unspecified: Secondary | ICD-10-CM | POA: Diagnosis present

## 2012-09-24 DIAGNOSIS — J9610 Chronic respiratory failure, unspecified whether with hypoxia or hypercapnia: Secondary | ICD-10-CM | POA: Diagnosis present

## 2012-09-24 DIAGNOSIS — Z8679 Personal history of other diseases of the circulatory system: Secondary | ICD-10-CM

## 2012-09-24 DIAGNOSIS — K560 Paralytic ileus: Secondary | ICD-10-CM | POA: Diagnosis present

## 2012-09-24 DIAGNOSIS — K56609 Unspecified intestinal obstruction, unspecified as to partial versus complete obstruction: Principal | ICD-10-CM | POA: Diagnosis present

## 2012-09-24 DIAGNOSIS — I2510 Atherosclerotic heart disease of native coronary artery without angina pectoris: Secondary | ICD-10-CM | POA: Diagnosis present

## 2012-09-24 DIAGNOSIS — F4322 Adjustment disorder with anxiety: Secondary | ICD-10-CM | POA: Diagnosis present

## 2012-09-24 DIAGNOSIS — Z881 Allergy status to other antibiotic agents status: Secondary | ICD-10-CM

## 2012-09-24 DIAGNOSIS — Z886 Allergy status to analgesic agent status: Secondary | ICD-10-CM

## 2012-09-24 DIAGNOSIS — J449 Chronic obstructive pulmonary disease, unspecified: Secondary | ICD-10-CM

## 2012-09-24 DIAGNOSIS — Z8719 Personal history of other diseases of the digestive system: Secondary | ICD-10-CM

## 2012-09-24 DIAGNOSIS — Z66 Do not resuscitate: Secondary | ICD-10-CM | POA: Diagnosis present

## 2012-09-24 DIAGNOSIS — M810 Age-related osteoporosis without current pathological fracture: Secondary | ICD-10-CM | POA: Diagnosis present

## 2012-09-24 DIAGNOSIS — I119 Hypertensive heart disease without heart failure: Secondary | ICD-10-CM

## 2012-09-24 DIAGNOSIS — Z888 Allergy status to other drugs, medicaments and biological substances status: Secondary | ICD-10-CM

## 2012-09-24 DIAGNOSIS — J4489 Other specified chronic obstructive pulmonary disease: Secondary | ICD-10-CM | POA: Diagnosis present

## 2012-09-24 DIAGNOSIS — Z8249 Family history of ischemic heart disease and other diseases of the circulatory system: Secondary | ICD-10-CM

## 2012-09-24 DIAGNOSIS — Z882 Allergy status to sulfonamides status: Secondary | ICD-10-CM

## 2012-09-24 DIAGNOSIS — J9611 Chronic respiratory failure with hypoxia: Secondary | ICD-10-CM | POA: Diagnosis present

## 2012-09-24 DIAGNOSIS — K5730 Diverticulosis of large intestine without perforation or abscess without bleeding: Secondary | ICD-10-CM | POA: Diagnosis present

## 2012-09-24 DIAGNOSIS — K219 Gastro-esophageal reflux disease without esophagitis: Secondary | ICD-10-CM | POA: Diagnosis present

## 2012-09-24 DIAGNOSIS — IMO0002 Reserved for concepts with insufficient information to code with codable children: Secondary | ICD-10-CM

## 2012-09-24 DIAGNOSIS — Z8261 Family history of arthritis: Secondary | ICD-10-CM

## 2012-09-24 DIAGNOSIS — F3289 Other specified depressive episodes: Secondary | ICD-10-CM | POA: Diagnosis present

## 2012-09-24 DIAGNOSIS — E785 Hyperlipidemia, unspecified: Secondary | ICD-10-CM | POA: Diagnosis present

## 2012-09-24 DIAGNOSIS — S42309D Unspecified fracture of shaft of humerus, unspecified arm, subsequent encounter for fracture with routine healing: Secondary | ICD-10-CM

## 2012-09-24 DIAGNOSIS — Z9981 Dependence on supplemental oxygen: Secondary | ICD-10-CM

## 2012-09-24 DIAGNOSIS — Z87891 Personal history of nicotine dependence: Secondary | ICD-10-CM

## 2012-09-24 DIAGNOSIS — Z9889 Other specified postprocedural states: Secondary | ICD-10-CM

## 2012-09-24 DIAGNOSIS — I10 Essential (primary) hypertension: Secondary | ICD-10-CM | POA: Diagnosis present

## 2012-09-24 HISTORY — DX: Peptic ulcer, site unspecified, unspecified as acute or chronic, without hemorrhage or perforation: K27.9

## 2012-09-24 HISTORY — DX: Diverticulitis of large intestine without perforation or abscess without bleeding: K57.32

## 2012-09-24 HISTORY — DX: Heart failure, unspecified: I50.9

## 2012-09-24 HISTORY — DX: Polyp of colon: K63.5

## 2012-09-24 HISTORY — DX: Unspecified asthma, uncomplicated: J45.909

## 2012-09-24 HISTORY — DX: Fracture of right shoulder girdle, part unspecified, initial encounter for closed fracture: S42.91XA

## 2012-09-24 LAB — COMPREHENSIVE METABOLIC PANEL
ALT: 15 U/L (ref 0–35)
AST: 23 U/L (ref 0–37)
CO2: 32 mEq/L (ref 19–32)
Chloride: 99 mEq/L (ref 96–112)
Creatinine, Ser: 0.68 mg/dL (ref 0.50–1.10)
GFR calc Af Amer: >90 mL/min (ref >90)

## 2012-09-24 LAB — URINALYSIS, ROUTINE W REFLEX MICROSCOPIC
Nitrite: NEGATIVE
Specific Gravity, Urine: 1.022 (ref 1.005–1.030)
Urobilinogen, UA: 0.2 mg/dL (ref 0.0–1.0)
pH: 6.5 (ref 5.0–8.0)

## 2012-09-24 LAB — CBC WITH DIFFERENTIAL/PLATELET
Basophils Absolute: 0 10*3/uL (ref 0.0–0.1)
HCT: 34.9 % — ABNORMAL LOW (ref 36.0–46.0)
Hemoglobin: 11.4 g/dL — ABNORMAL LOW (ref 12.0–15.0)
Lymphocytes Relative: 4 % — ABNORMAL LOW (ref 12–46)
Monocytes Absolute: 0.7 10*3/uL (ref 0.1–1.0)
Monocytes Relative: 4 % (ref 3–12)
Neutro Abs: 14.7 10*3/uL — ABNORMAL HIGH (ref 1.7–7.7)
WBC: 16 10*3/uL — ABNORMAL HIGH (ref 4.0–10.5)

## 2012-09-24 LAB — LIPASE, BLOOD: Lipase: 14 U/L (ref 11–59)

## 2012-09-24 IMAGING — CT CT ABD-PELV W/ CM
2 of 5 series · 16 of 46 positions shown, 18 images · IV contrast (APPLIED)
Comparison: [DATE]

CLINICAL DATA: Abdominal pain, distention, nausea and vomiting.
History of small bowel obstruction and diverticulosis.

CT ABDOMEN AND PELVIS WITH CONTRAST
TECHNIQUE: Multidetector CT imaging of the abdomen and pelvis was
performed following the standard protocol during bolus
administration of intravenous contrast.
Contrast: 100mL OMNIPAQUE IOHEXOL 300 MG/ML  SOLN

[Series 2: abd/pelv with 5.0 b31f st · axial · 0.71mm/px · z∈[-524,-144]mm · 13 of 86 slices shown, 15 images]
[im 5/86  soft-tissue]
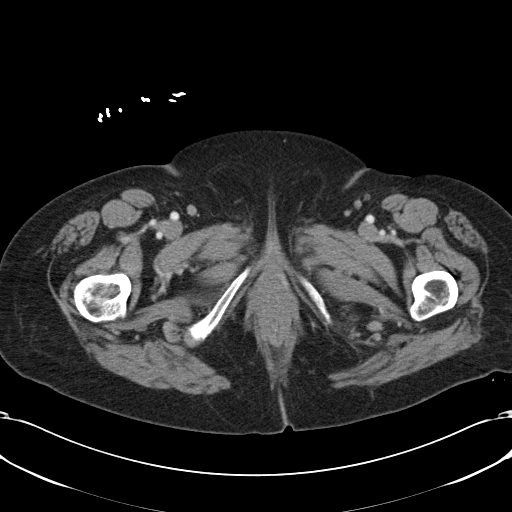
[im 5/86  bone]
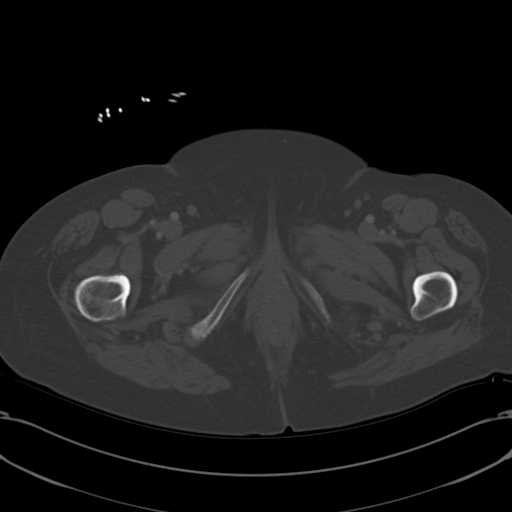
[im 13/86  soft-tissue]
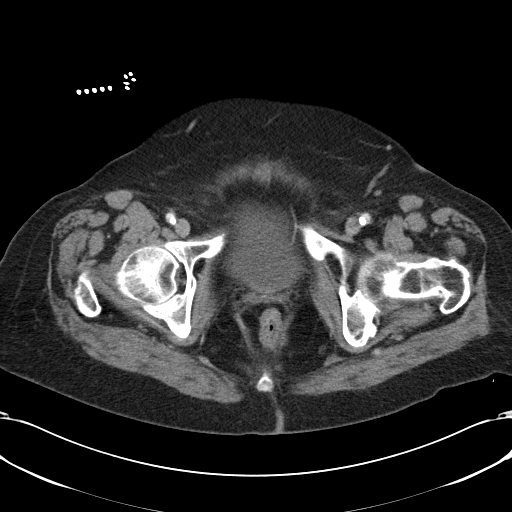
[im 17/86  soft-tissue]
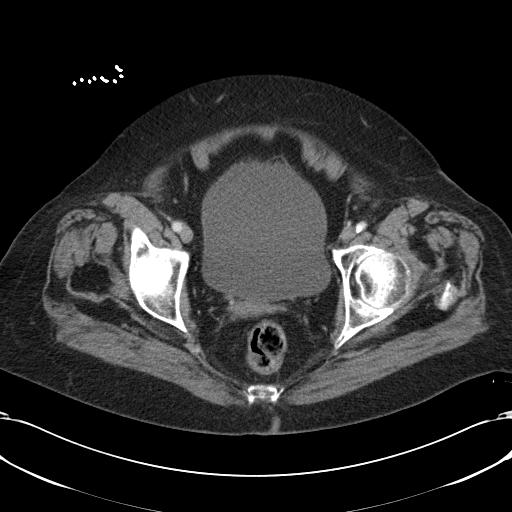
[im 25/86  soft-tissue]
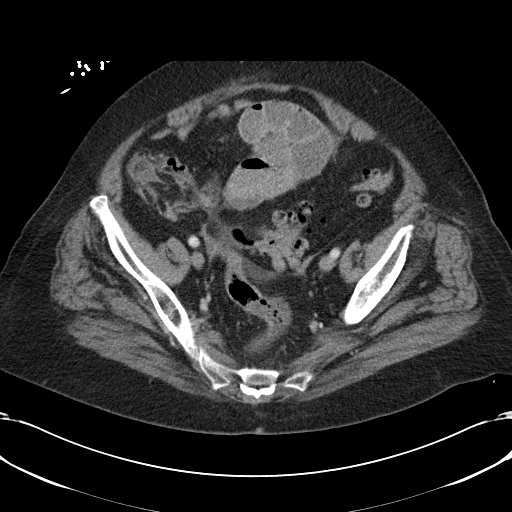
[im 29/86  soft-tissue]
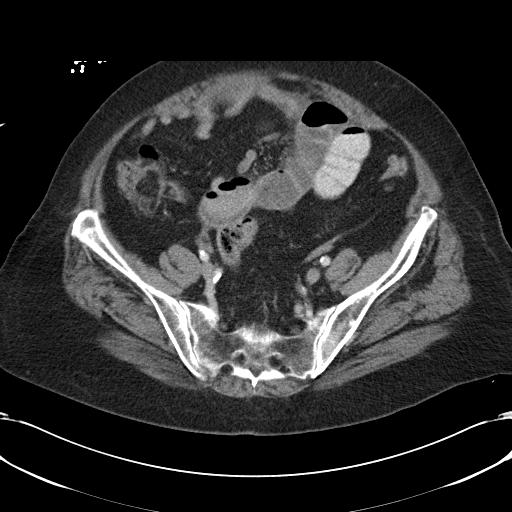
[im 37/86  soft-tissue]
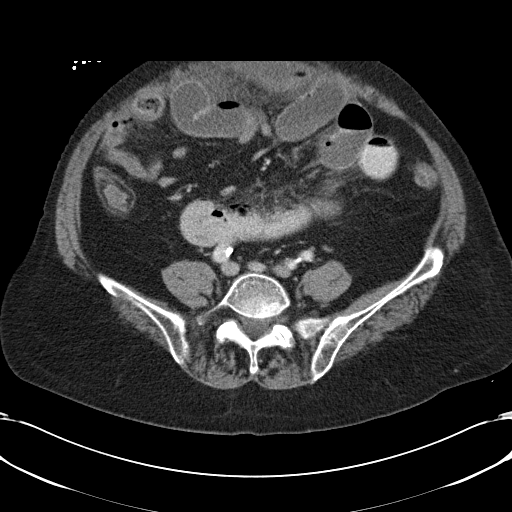
[im 45/86  soft-tissue]
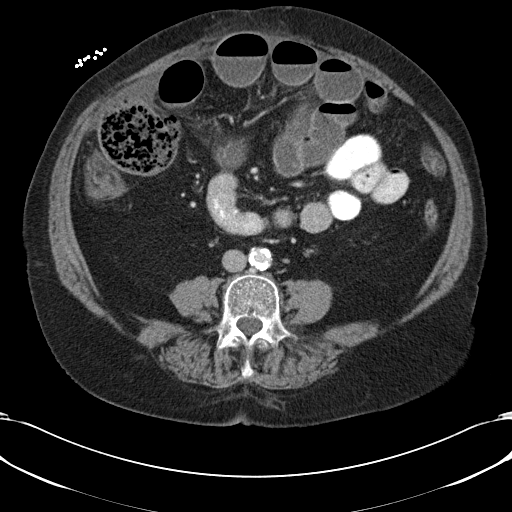
[im 49/86  soft-tissue]
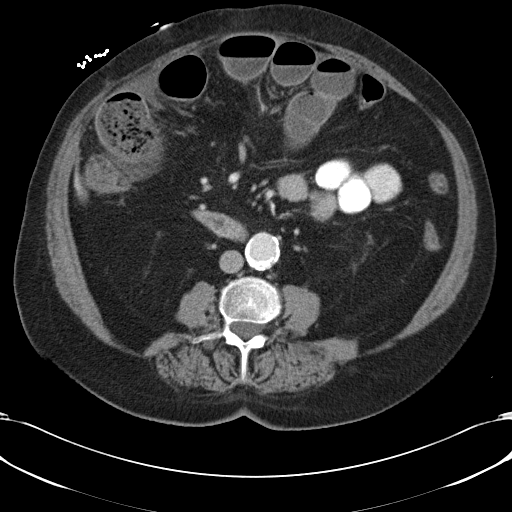
[im 57/86  soft-tissue]
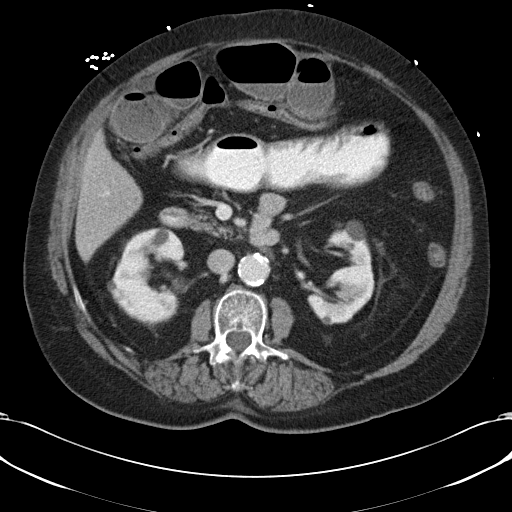
[im 57/86  bone]
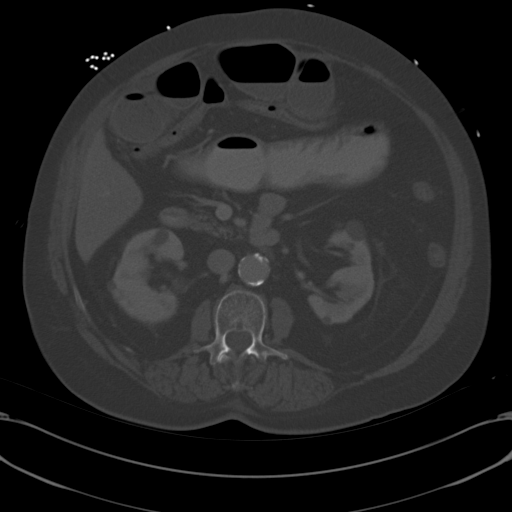
[im 61/86  soft-tissue]
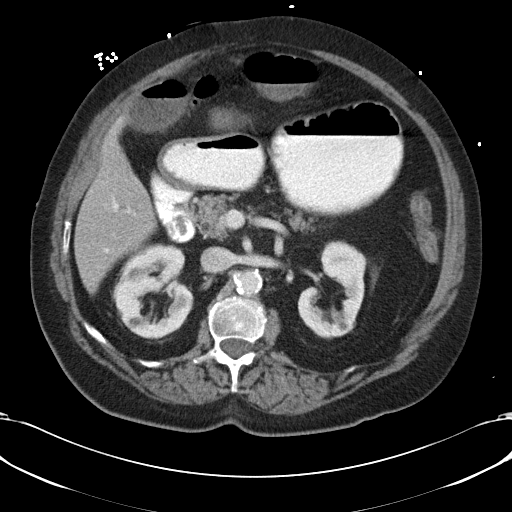
[im 69/86  soft-tissue]
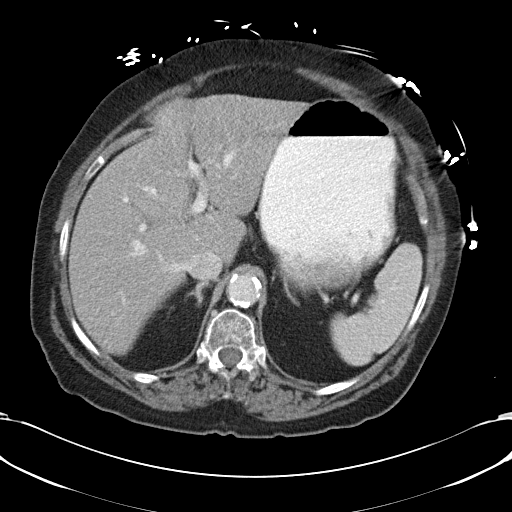
[im 73/86  soft-tissue]
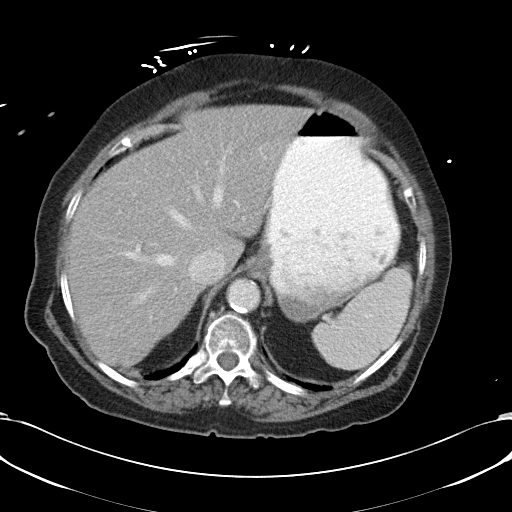
[im 81/86  soft-tissue]
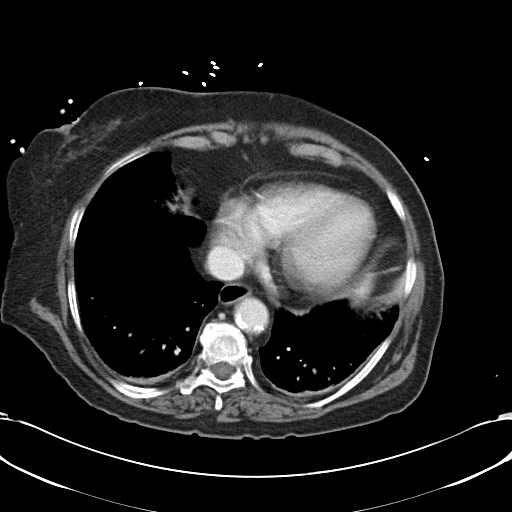

[Series 5: coronals · coronal · 0.62mm/px · 3 of 139 slices shown]
[im 47/139  soft-tissue]
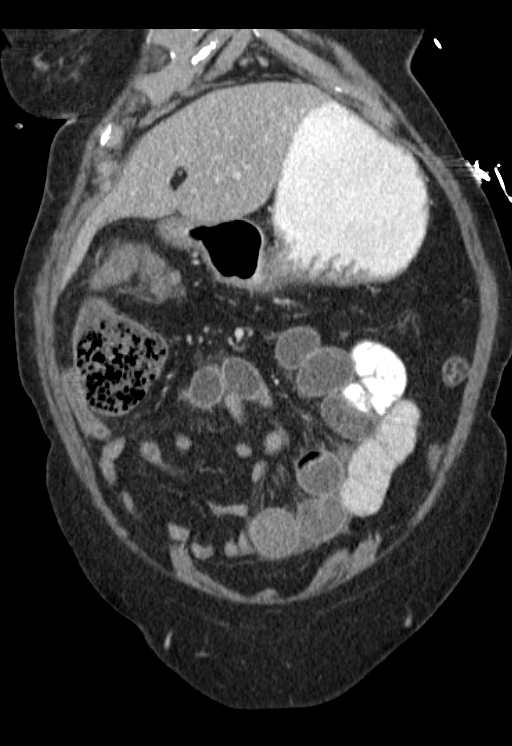
[im 62/139  soft-tissue]
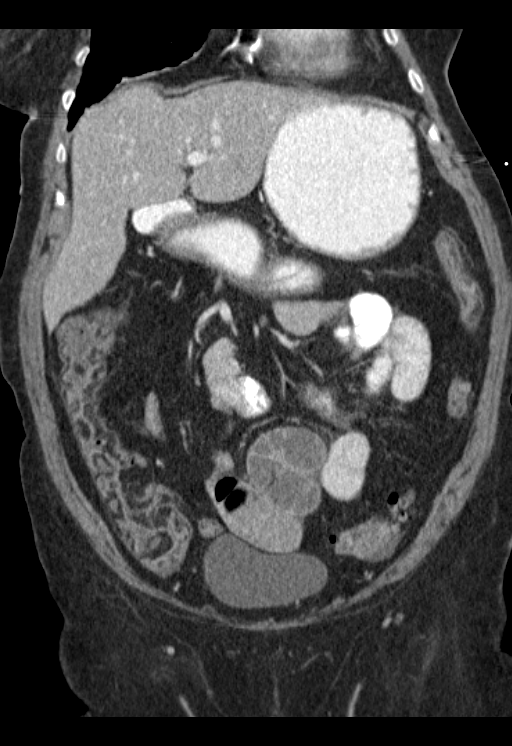
[im 77/139  soft-tissue]
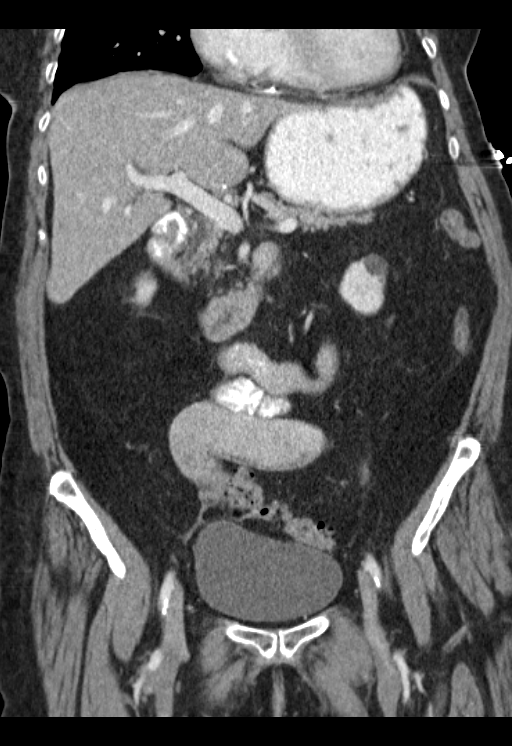

[16 of 46 positions shown; findings below may reference images not displayed]

FINDINGS: Dilated and fluid-filled small bowel loops are present
involving several jejunal loops.  Maximal diameter of small bowel
loops is approximately 3.8 cm.  There is associated adjacent
mesenteric edema.  No pneumatosis, perforation or abscess is
identified.

The colon is decompressed.  There is diverticulosis of the sigmoid
colon without evidence of acute diverticulitis.  No significant
free fluid is identified.

The liver, spleen, pancreas and adrenal glands are within normal
limits.  Multiple small cysts are noted in both kidneys.  There is
no evidence of hydronephrosis.  The abdominal aorta shows
atherosclerotic plaque without evidence of aneurysm.

No bony abnormalities are identified.  No hernias, focal masses or
enlarged lymph nodes are identified. The bladder is unremarkable.
IMPRESSION: Partial small bowel obstruction with dilated mid jejunal loops
present.

## 2012-09-24 MED ORDER — ONDANSETRON HCL 4 MG/2ML IJ SOLN
INTRAMUSCULAR | Status: AC
  Filled 2012-09-24: qty 2

## 2012-09-24 MED ORDER — SODIUM CHLORIDE 0.9 % IV BOLUS (SEPSIS)
1000.0000 mL | Freq: Once | INTRAVENOUS | Status: AC
  Administered 2012-09-24: 1000 mL via INTRAVENOUS

## 2012-09-24 MED ORDER — MORPHINE SULFATE 4 MG/ML IJ SOLN
4.0000 mg | Freq: Once | INTRAMUSCULAR | Status: AC
  Administered 2012-09-24: 4 mg via INTRAVENOUS
  Filled 2012-09-24: qty 1

## 2012-09-24 MED ORDER — IOHEXOL 300 MG/ML  SOLN
100.0000 mL | Freq: Once | INTRAMUSCULAR | Status: AC | PRN
  Administered 2012-09-24: 100 mL via INTRAVENOUS

## 2012-09-24 MED ORDER — IOHEXOL 300 MG/ML  SOLN
50.0000 mL | Freq: Once | INTRAMUSCULAR | Status: AC | PRN
  Administered 2012-09-24: 50 mL via ORAL

## 2012-09-24 MED ORDER — ONDANSETRON HCL 4 MG/2ML IJ SOLN
4.0000 mg | Freq: Once | INTRAMUSCULAR | Status: AC
  Administered 2012-09-24: 4 mg via INTRAVENOUS
  Filled 2012-09-24: qty 2

## 2012-09-24 MED ORDER — ONDANSETRON HCL 4 MG/2ML IJ SOLN
4.0000 mg | Freq: Once | INTRAMUSCULAR | Status: AC
  Administered 2012-09-24: 4 mg via INTRAVENOUS

## 2012-09-24 NOTE — ED Provider Notes (Signed)
History     CSN: [id number]  Arrival date & time 09/24/12  1940   First MD Initiated Contact with Patient 09/24/12 1941      Chief Complaint  Patient presents with  . Nausea  . Abdominal Pain    (Consider location/radiation/quality/duration/timing/severity/associated sxs/prior treatment) The history is provided by the patient, medical records and a relative. No language interpreter was used.    [name] is a 79 y.o. female  with a hx of von Willebrand's disease,coronary artery disease, diverticulosis CVA, small bowel obstruction, COPD, hypertension, GERD presents to the Emergency Department complaining of gradual, persistent, progressively worsening abdominal pain onset this morning.  Patient states she's had abdominal pain, associated nausea and vomiting as well as abdominal bloating which has progressively worsened throughout the day. Also reports the patient has vomited greater than 7 times. EMS reports that patient was not actively vomiting upon their arrival they did administer Zofran IV. Nothing makes it better and nothing makes it worse.  Pt denies fever, chills, headache, chest pain, shortness of breath more than usual, diarrhea, weakness, dizziness, syncope, dysuria, hematuria.     Past Medical History  Diagnosis Date  . Von Willebrand's disease   . CORONARY ARTERY DISEASE   . DIVERTICULOSIS, COLON   . CEREBROVASCULAR ACCIDENT, HX OF 1991  . SMALL BOWEL OBSTRUCTION, HX OF   . CAROTID ENDARTERECTOMY, HX OF 06/23/1992  . COPD     end stage, O2 dep  . OSTEOPOROSIS   . HYPERTENSION   . GERD   . Depression   . Hyperlipidemia   . Shoulder fracture, right   . Asthma   . CHF (congestive heart failure)     Past Surgical History  Procedure Laterality Date  . Cholecystectomy  1965  . Appendectomy  1965  . Exploratory laparotomy  1965    peritonitis due to ruptured appx    Family History  Problem Relation Age of Onset  . Arthritis Mother   . Arthritis Father    . Heart disease Father   . Hyperlipidemia Father   . Hypertension Father     History  Substance Use Topics  . Smoking status: Former Smoker -- 1.50 packs/day for 40 years    Quit date: 06/23/1988  . Smokeless tobacco: Never Used  . Alcohol Use: Yes     Comment: 2 glass each night    OB History   Grav Para Term Preterm Abortions TAB SAB Ect Mult Living                  Review of Systems  Constitutional: Negative for fever, diaphoresis, appetite change, fatigue and unexpected weight change.  HENT: Negative for mouth sores, trouble swallowing, neck pain and neck stiffness.   Respiratory: Negative for cough, chest tightness, shortness of breath, wheezing and stridor.   Cardiovascular: Negative for chest pain and palpitations.  Gastrointestinal: Positive for nausea, vomiting, abdominal pain and abdominal distention. Negative for diarrhea, constipation, blood in stool and rectal pain.  Genitourinary: Negative for dysuria, urgency, frequency, hematuria, flank pain and difficulty urinating.  Musculoskeletal: Negative for back pain.  Skin: Negative for rash.  Neurological: Negative for weakness.  Hematological: Negative for adenopathy.  Psychiatric/Behavioral: Negative for confusion.    Allergies  Aspirin; Ciprofloxacin; Hctz; Klotrix; Other; Pravachol; Sulfa antibiotics; and Tetracyclines & related  Home Medications   Current Outpatient Rx  Name  Route  Sig  Dispense  Refill  . acetaminophen (TYLENOL) 500 MG tablet   Oral   Take 1,000  mg by mouth every 6 (six) hours as needed for pain.         Marland Kitchen albuterol (PROVENTIL HFA;VENTOLIN HFA) 108 (90 BASE) MCG/ACT inhaler   Inhalation   Inhale 2 puffs into the lungs every 4 (four) hours as needed for shortness of breath.         . ALPRAZolam (XANAX) 1 MG tablet   Oral   Take 1 mg by mouth every 4 (four) hours as needed for anxiety.         . ALPRAZolam (XANAX) 1 MG tablet   Oral   Take 1 mg by mouth 3 (three) times  daily.         Marland Kitchen amLODipine (NORVASC) 5 MG tablet   Oral   Take 1 tablet (5 mg total) by mouth daily. Hold if SBP < 100.   30 tablet   5   . esomeprazole (NEXIUM) 40 MG capsule   Oral   Take 40 mg by mouth daily before breakfast.         . furosemide (LASIX) 40 MG tablet   Oral   Take 1 tablet (40 mg total) by mouth daily.   30 tablet   11   . ipratropium-albuterol (DUONEB) 0.5-2.5 (3) MG/3ML SOLN   Nebulization   Take 3 mLs by nebulization every 3 (three) hours as needed (for shortness of breath.).         Marland Kitchen isosorbide mononitrate (IMDUR) 60 MG 24 hr tablet   Oral   Take 1 tablet (60 mg total) by mouth daily.   30 tablet   3     Please have the patient to call the office at Ph:3 ...   . lovastatin (MEVACOR) 40 MG tablet   Oral   Take 1 tablet (40 mg total) by mouth at bedtime.   90 tablet   1   . metoprolol succinate (TOPROL-XL) 25 MG 24 hr tablet   Oral   Take 25 mg by mouth 2 (two) times daily.         Marland Kitchen morphine 20 MG/5ML solution   Oral   Take 10 mg by mouth every 4 (four) hours as needed for pain.         . nitroGLYCERIN (NITROSTAT) 0.4 MG SL tablet   Sublingual   Place 0.4 mg under the tongue every 5 (five) minutes as needed for chest pain.         Marland Kitchen PARoxetine (PAXIL) 10 MG tablet   Oral   Take 10 mg by mouth daily.         . polyethylene glycol (MIRALAX / GLYCOLAX) packet   Oral   Take 17 g by mouth daily.         . predniSONE (DELTASONE) 10 MG tablet   Oral   Take 10 mg by mouth daily.         . prochlorperazine (COMPAZINE) 10 MG tablet   Oral   Take 10 mg by mouth every 6 (six) hours as needed (for nausea.).         Marland Kitchen senna-docusate (SENOKOT-S) 8.6-50 MG per tablet   Oral   Take 1 tablet by mouth at bedtime.         . sodium chloride (OCEAN) 0.65 % nasal spray   Nasal   Place 1 spray into the nose as needed for congestion.         . valsartan (DIOVAN) 80 MG tablet   Oral   Take 40 mg by mouth  daily.           . Vitamins A & D (VITAMIN A & D) ointment   Topical   Apply 1 application topically as needed (for bed sores.).            BP 152/72  Pulse 93  Temp(Src) 98 F (36.7 C) (Oral)  Resp 16  SpO2 93%  Physical Exam  Nursing note and vitals reviewed. Constitutional: She is oriented to person, place, and time. She appears well-developed and well-nourished. No distress.  HENT:  Head: Normocephalic and atraumatic.  Nose: Nose normal.  Mouth/Throat: Oropharynx is clear and moist. No oropharyngeal exudate.  Eyes: Conjunctivae and EOM are normal. Pupils are equal, round, and reactive to light. No scleral icterus.  Neck: Normal range of motion. Neck supple.  Cardiovascular: Normal rate, regular rhythm, normal heart sounds and intact distal pulses.  Exam reveals no gallop and no friction rub.   No murmur heard. Pulmonary/Chest: Effort normal and breath sounds normal. No respiratory distress. She has no wheezes. She has no rales. She exhibits no tenderness.  Abdominal: Soft. Bowel sounds are normal. She exhibits distension. She exhibits no mass. There is generalized tenderness. There is rebound (mild) and guarding. There is no rigidity, no CVA tenderness, no tenderness at McBurney's point and negative Murphy's sign.  Abd is firm to the touch but not rigid, pain to palpation throughout but worse in the epigastric region, no palpable pulsatile masses  Musculoskeletal: Normal range of motion. She exhibits no edema.  Lymphadenopathy:    She has no cervical adenopathy.  Neurological: She is alert and oriented to person, place, and time. She exhibits normal muscle tone. Coordination normal.  Speech is clear and goal oriented Moves extremities without ataxia  Skin: Skin is warm and dry. No rash noted. She is not diaphoretic. No erythema.  Psychiatric: She has a normal mood and affect.    ED Course  Procedures (including critical care time)  Labs Reviewed  CBC WITH DIFFERENTIAL - Abnormal;  Notable for the following:    WBC 16.0 (*)    RBC 3.74 (*)    Hemoglobin 11.4 (*)    HCT 34.9 (*)    Neutrophils Relative 91 (*)    Neutro Abs 14.7 (*)    Lymphocytes Relative 4 (*)    All other components within normal limits  COMPREHENSIVE METABOLIC PANEL - Abnormal; Notable for the following:    Potassium 3.4 (*)    Glucose, Bld 157 (*)    GFR calc non Af Amer 79 (*)    All other components within normal limits  LIPASE, BLOOD  URINALYSIS, ROUTINE W REFLEX MICROSCOPIC  POCT I-STAT TROPONIN I   Dg Chest 1 View  09/24/2012  *RADIOLOGY REPORT*  Clinical Data: Epigastric pain and cough.  CHEST - 1 VIEW  Comparison: 08/11/2012  Findings: Stable cardiomegaly and underlying chronic lung disease. No edema, focal consolidation or pleural fluid is identified. Fracture of proximal right humerus shows poor healing.  IMPRESSION: Stable cardiomegaly and chronic lung disease.  No acute findings. Fracture of proximal right humerus again noted.   Original Report Authenticated By: Irish Lack, M.D.    Ct Abdomen Pelvis W Contrast  09/24/2012  *RADIOLOGY REPORT*  Clinical Data: Abdominal pain, distention, nausea and vomiting. History of small bowel obstruction and diverticulosis.  CT ABDOMEN AND PELVIS WITH CONTRAST  Technique:  Multidetector CT imaging of the abdomen and pelvis was performed following the standard protocol during bolus administration of intravenous contrast.  Contrast:  OMNIPAQUE IOHEXOL 300 MG/ML  SOLN  Comparison: 04/14/2004  Findings: Dilated and fluid-filled small bowel loops are present involving several jejunal loops.  Maximal diameter of small bowel loops is approximately 3.8 cm.  There is associated adjacent mesenteric edema.  No pneumatosis, perforation or abscess is identified.  The colon is decompressed.  There is diverticulosis of the sigmoid colon without evidence of acute diverticulitis.  No significant free fluid is identified.  The liver, spleen, pancreas and adrenal  glands are within normal limits.  Multiple small cysts are noted in both kidneys.  There is no evidence of hydronephrosis.  The abdominal aorta shows atherosclerotic plaque without evidence of aneurysm.  No bony abnormalities are identified.  No hernias, focal masses or enlarged lymph nodes are identified. The bladder is unremarkable.  IMPRESSION: Partial small bowel obstruction with dilated mid jejunal loops present.   Original Report Authenticated By: Irish Lack, M.D.      1. Small bowel obstruction   2. End stage COPD   3. Hyperlipidemia   4. SMALL BOWEL OBSTRUCTION, HX OF   5. Von Willebrand's disease   6. CORONARY ARTERY DISEASE   7. GERD   8. CEREBROVASCULAR ACCIDENT, HX OF   9. DIVERTICULOSIS, COLON       MDM  Ileene Hutchinson presents for abdominal pain, nausea and vomiting. Concern for diverticulitis versus small bowel obstruction versus perforated ulcer versus abdominal aortic aneurysm versus peritonitis.    UA without evidence of urinary tract infection, troponin negative, showed mild hypokalemia at 3.4 and normal lipase. Leukocytosis of 16 with left shift. Chest x-ray with stable cardiomegaly chronic lung disease, fracture of the proximal right humerus, stable no evidence of pneumonia.  CT scan with partial small bowel structure in and dilated mid jejunal loops Present.  I personally reviewed the imaging tests through PACS system.  I reviewed available ER/hospitalization records through the EMR.    NG tube placed, will consult triad hospitalist for admission.  Dr. Gerhard Munch was consulted, evaluated this patient with me and agrees with the plan.              Dahlia Client Ryker [place], PA-C 09/24/12 2308

## 2012-09-24 NOTE — ED Notes (Addendum)
Pt reports to the ED for eval of nausea, vomiting, and abdominal pain that began about 1 week ago. Pt has hx of diverticulosis. Pt A&O x4. Pt has chronic COPD and on home O2. Pt denies any coffee ground or gross blood in her emesis. Pt denies any diarrhea. Abdomen appears distended and is diffusely tender. Pt had 4 mg Zofran en route. Pt denies any acute SOB or CP.

## 2012-09-24 NOTE — Telephone Encounter (Signed)
79 yo end stage gold D on home hospice, DNR/DNI;   The home hospice nurse called, the patient has been having severe nausea and vomiting, complaining of abdominal pain; has not been able to keep anything down.    I advised her to bring the patient to the ED for evaluation and symptom treatment. She does not desire agressive care, but symptom management.

## 2012-09-24 NOTE — Telephone Encounter (Signed)
noted 

## 2012-09-25 DIAGNOSIS — I119 Hypertensive heart disease without heart failure: Secondary | ICD-10-CM

## 2012-09-25 LAB — BASIC METABOLIC PANEL
BUN: 7 mg/dL (ref 6–23)
CO2: 34 mEq/L — ABNORMAL HIGH (ref 19–32)
Chloride: 100 mEq/L (ref 96–112)
Creatinine, Ser: 0.67 mg/dL (ref 0.50–1.10)

## 2012-09-25 MED ORDER — IRBESARTAN 75 MG PO TABS
75.0000 mg | ORAL_TABLET | Freq: Every day | ORAL | Status: DC
  Administered 2012-09-25: 75 mg via ORAL
  Filled 2012-09-25: qty 1

## 2012-09-25 MED ORDER — SIMVASTATIN 20 MG PO TABS
20.0000 mg | ORAL_TABLET | Freq: Every day | ORAL | Status: DC
  Filled 2012-09-25: qty 1

## 2012-09-25 MED ORDER — HYDROMORPHONE HCL PF 1 MG/ML IJ SOLN: 1.0000 mg | INTRAMUSCULAR | Status: DC | PRN

## 2012-09-25 MED ORDER — BIOTENE DRY MOUTH MT LIQD
15.0000 mL | Freq: Two times a day (BID) | OROMUCOSAL | Status: DC
  Administered 2012-09-25 – 2012-09-27 (×6): 15 mL via OROMUCOSAL

## 2012-09-25 MED ORDER — PREDNISONE 10 MG PO TABS
10.0000 mg | ORAL_TABLET | Freq: Every day | ORAL | Status: DC
  Administered 2012-09-25 – 2012-09-29 (×5): 10 mg via ORAL
  Filled 2012-09-25 (×6): qty 1

## 2012-09-25 MED ORDER — SALINE NASAL SPRAY 0.65 % NA SOLN
1.0000 | NASAL | Status: DC | PRN
Start: 2012-09-25 — End: 2012-09-25

## 2012-09-25 MED ORDER — ISOSORBIDE MONONITRATE ER 60 MG PO TB24
60.0000 mg | ORAL_TABLET | Freq: Every day | ORAL | Status: DC
  Administered 2012-09-25 – 2012-09-29 (×5): 60 mg via ORAL
  Filled 2012-09-25 (×6): qty 1

## 2012-09-25 MED ORDER — ALPRAZOLAM 0.5 MG PO TABS
1.0000 mg | ORAL_TABLET | ORAL | Status: DC | PRN
  Administered 2012-09-26: 1 mg via ORAL
  Filled 2012-09-25: qty 2
  Filled 2012-09-25 (×3): qty 1
  Filled 2012-09-25 (×2): qty 2
  Filled 2012-09-25: qty 1
  Filled 2012-09-25: qty 2

## 2012-09-25 MED ORDER — PAROXETINE HCL 10 MG PO TABS
10.0000 mg | ORAL_TABLET | Freq: Every day | ORAL | Status: DC
  Administered 2012-09-25: 10 mg via ORAL
  Filled 2012-09-25: qty 1

## 2012-09-25 MED ORDER — KCL IN DEXTROSE-NACL 20-5-0.9 MEQ/L-%-% IV SOLN
INTRAVENOUS | Status: DC
  Administered 2012-09-25: 1000 mL via INTRAVENOUS
  Administered 2012-09-25 – 2012-09-26 (×2): via INTRAVENOUS
  Filled 2012-09-25 (×4): qty 1000

## 2012-09-25 MED ORDER — SALINE SPRAY 0.65 % NA SOLN: 1.0000 | NASAL | Status: DC | PRN

## 2012-09-25 MED ORDER — HYDRALAZINE HCL 20 MG/ML IJ SOLN: 10.0000 mg | Freq: Three times a day (TID) | INTRAMUSCULAR | Status: DC | PRN

## 2012-09-25 MED ORDER — PANTOPRAZOLE SODIUM 40 MG IV SOLR
40.0000 mg | Freq: Two times a day (BID) | INTRAVENOUS | Status: DC
  Administered 2012-09-25 – 2012-09-28 (×7): 40 mg via INTRAVENOUS
  Filled 2012-09-25 (×7): qty 40

## 2012-09-25 MED ORDER — ONDANSETRON HCL 4 MG/2ML IJ SOLN
4.0000 mg | Freq: Four times a day (QID) | INTRAMUSCULAR | Status: DC | PRN
  Administered 2012-09-25 (×2): 4 mg via INTRAVENOUS
  Filled 2012-09-25 (×2): qty 2

## 2012-09-25 MED ORDER — AMLODIPINE BESYLATE 5 MG PO TABS
5.0000 mg | ORAL_TABLET | Freq: Every day | ORAL | Status: DC
  Administered 2012-09-25: 5 mg via ORAL
  Filled 2012-09-25: qty 1

## 2012-09-25 MED ORDER — VITAMINS A & D EX OINT: 1.0000 "application " | TOPICAL_OINTMENT | CUTANEOUS | Status: DC | PRN

## 2012-09-25 MED ORDER — ALPRAZOLAM 0.5 MG PO TABS
1.0000 mg | ORAL_TABLET | Freq: Three times a day (TID) | ORAL | Status: DC
  Administered 2012-09-25 – 2012-09-29 (×13): 1 mg via ORAL
  Filled 2012-09-25 (×8): qty 2

## 2012-09-25 MED ORDER — PANTOPRAZOLE SODIUM 40 MG PO TBEC: 80.0000 mg | DELAYED_RELEASE_TABLET | Freq: Every day | ORAL | Status: DC

## 2012-09-25 MED ORDER — HYDROMORPHONE HCL PF 1 MG/ML IJ SOLN
1.0000 mg | INTRAMUSCULAR | Status: DC | PRN
  Administered 2012-09-26: 0.5 mg via INTRAVENOUS
  Filled 2012-09-25: qty 1

## 2012-09-25 MED ORDER — ENOXAPARIN SODIUM 40 MG/0.4ML ~~LOC~~ SOLN
40.0000 mg | SUBCUTANEOUS | Status: DC
  Filled 2012-09-25 (×2): qty 0.4

## 2012-09-25 MED ORDER — ACETAMINOPHEN 500 MG PO TABS
1000.0000 mg | ORAL_TABLET | Freq: Four times a day (QID) | ORAL | Status: DC | PRN
  Administered 2012-09-26 – 2012-09-27 (×2): 1000 mg via ORAL
  Filled 2012-09-25 (×4): qty 2

## 2012-09-25 MED ORDER — METOPROLOL SUCCINATE ER 25 MG PO TB24
25.0000 mg | ORAL_TABLET | Freq: Two times a day (BID) | ORAL | Status: DC
  Administered 2012-09-25 – 2012-09-29 (×10): 25 mg via ORAL
  Filled 2012-09-25 (×11): qty 1

## 2012-09-25 MED ORDER — ONDANSETRON HCL 4 MG PO TABS: 4.0000 mg | ORAL_TABLET | Freq: Four times a day (QID) | ORAL | Status: DC | PRN

## 2012-09-25 MED ORDER — ALBUTEROL SULFATE HFA 108 (90 BASE) MCG/ACT IN AERS
2.0000 | INHALATION_SPRAY | RESPIRATORY_TRACT | Status: DC | PRN
  Administered 2012-09-25 – 2012-09-26 (×3): 2 via RESPIRATORY_TRACT
  Filled 2012-09-25: qty 6.7

## 2012-09-25 NOTE — Progress Notes (Signed)
Patient c/o some abdominal discomfort; flushed NG tube and reassessed 1 hour later and patient reports no more abdominal discomfort; visit from husband (dementia) and his CNA today for about 2 hours; no signs or symptoms of distress noted.

## 2012-09-25 NOTE — ED Provider Notes (Signed)
  Medical screening examination/treatment/procedure(s) were performed by non-physician practitioner and as supervising physician I was immediately available for consultation/collaboration.  On my exam the patient was uncomfortable appearing, but had some analgesia following several rounds of Dilaudid.  I saw the ECG (if appropriate), relevant labs and studies - I agree with the interpretation.  Date: 09/25/2012  Rate: 86  Rhythm: normal sinus rhythm  QRS Axis: normal  Intervals: normal  ST/T Wave abnormalities: nonspecific T wave changes  Conduction Disutrbances:none  Narrative Interpretation:   Old EKG Reviewed: none available borderline    Gerhard Munch, MD 09/25/12 [phone]

## 2012-09-25 NOTE — H&P (Signed)
[facility] History and Physical  [name] RUE:[id number] DOB: [birth year]    PCP:   Rene Paci, MD   Chief Complaint: abdominal pain and nausea/vomiting.  HPI:  [name] is an 79 y.o. female with hx of prior abdominal surgeries (Appx, CCY, exploratory laparatomy) with hx of prior SBO, CVA, HTN, CAD, COPD,  Hx of Von Villebrand's disease,  s/p carotid endarterectomy, presents to the ER with one day hx of abdominal pain, increased distention, nausea, and recurrent vomiting.  She was found to have a partial SBO, with no evidence of perforation by CT.  She has a leukocytosis with WBC 16K, Hb 11.4 g/DL,  Lipase of 14, and normal LFTs.  Hospitalist was asked to admit her for partial SBO.  Rewiew of Systems:  Constitutional: Negative for malaise, fever and chills. No significant weight loss or weight gain Eyes: Negative for eye pain, redness and discharge, diplopia, visual changes, or flashes of light. ENMT: Negative for ear pain, hoarseness, nasal congestion, sinus pressure and sore throat. No headaches; tinnitus, drooling, or problem swallowing. Cardiovascular: Negative for chest pain, palpitations, diaphoresis, dyspnea and peripheral edema. ; No orthopnea, PND Respiratory: Negative for cough, hemoptysis, wheezing and stridor. No pleuritic chestpain. Gastrointestinal: Negative for diarrhea, constipation, melena, blood in stool, hematemesis, jaundice and rectal bleeding.    Genitourinary: Negative for frequency, dysuria, incontinence,flank pain and hematuria; Musculoskeletal: Negative for back pain and neck pain. Negative for swelling and trauma.;  Skin: . Negative for pruritus, rash, abrasions, bruising and skin lesion.; ulcerations Neuro: Negative for headache, lightheadedness and neck stiffness. Negative for weakness, altered level of consciousness , altered mental status, extremity weakness, burning feet, involuntary movement, seizure and syncope.  Psych: negative  for anxiety, depression, insomnia, tearfulness, panic attacks, hallucinations, paranoia, suicidal or homicidal ideation    Past Medical History  Diagnosis Date  . Von Willebrand's disease   . CORONARY ARTERY DISEASE   . DIVERTICULOSIS, COLON   . CEREBROVASCULAR ACCIDENT, HX OF 1991  . SMALL BOWEL OBSTRUCTION, HX OF   . CAROTID ENDARTERECTOMY, HX OF 06/23/1992  . COPD     end stage, O2 dep  . OSTEOPOROSIS   . HYPERTENSION   . GERD   . Depression   . Hyperlipidemia   . Shoulder fracture, right   . Asthma   . CHF (congestive heart failure)     Past Surgical History  Procedure Laterality Date  . Cholecystectomy  1965  . Appendectomy  1965  . Exploratory laparotomy  1965    peritonitis due to ruptured appx    Medications:  HOME MEDS: Prior to Admission medications   Medication Sig Start Date End Date Taking? Authorizing Provider  acetaminophen (TYLENOL) 500 MG tablet Take 1,000 mg by mouth every 6 (six) hours as needed for pain.   Yes Historical Provider, MD  albuterol (PROVENTIL HFA;VENTOLIN HFA) 108 (90 BASE) MCG/ACT inhaler Inhale 2 puffs into the lungs every 4 (four) hours as needed for shortness of breath.   Yes Historical Provider, MD  ALPRAZolam Prudy Feeler) 1 MG tablet Take 1 mg by mouth every 4 (four) hours as needed for anxiety.   Yes Historical Provider, MD  ALPRAZolam Prudy Feeler) 1 MG tablet Take 1 mg by mouth 3 (three) times daily.   Yes Historical Provider, MD  amLODipine (NORVASC) 5 MG tablet Take 1 tablet (5 mg total) by mouth daily. Hold if SBP < 100. 09/23/12  Yes Cassell Clement, MD  esomeprazole (NEXIUM) 40 MG capsule Take 40 mg by mouth daily  before breakfast.   Yes Historical Provider, MD  furosemide (LASIX) 40 MG tablet Take 1 tablet (40 mg total) by mouth daily. 06/17/12  Yes Cassell Clement, MD  ipratropium-albuterol (DUONEB) 0.5-2.5 (3) MG/3ML SOLN Take 3 mLs by nebulization every 3 (three) hours as needed (for shortness of breath.).   Yes Historical Provider, MD   isosorbide mononitrate (IMDUR) 60 MG 24 hr tablet Take 1 tablet (60 mg total) by mouth daily. 07/22/12  Yes Cassell Clement, MD  lovastatin (MEVACOR) 40 MG tablet Take 1 tablet (40 mg total) by mouth at bedtime. 09/03/12  Yes Cassell Clement, MD  metoprolol succinate (TOPROL-XL) 25 MG 24 hr tablet Take 25 mg by mouth 2 (two) times daily.   Yes Historical Provider, MD  morphine 20 MG/5ML solution Take 10 mg by mouth every 4 (four) hours as needed for pain.   Yes Historical Provider, MD  nitroGLYCERIN (NITROSTAT) 0.4 MG SL tablet Place 0.4 mg under the tongue every 5 (five) minutes as needed for chest pain.   Yes Historical Provider, MD  PARoxetine (PAXIL) 10 MG tablet Take 10 mg by mouth daily.   Yes Historical Provider, MD  polyethylene glycol (MIRALAX / GLYCOLAX) packet Take 17 g by mouth daily.   Yes Historical Provider, MD  predniSONE (DELTASONE) 10 MG tablet Take 10 mg by mouth daily.   Yes Historical Provider, MD  prochlorperazine (COMPAZINE) 10 MG tablet Take 10 mg by mouth every 6 (six) hours as needed (for nausea.).   Yes Historical Provider, MD  senna-docusate (SENOKOT-S) 8.6-50 MG per tablet Take 1 tablet by mouth at bedtime.   Yes Historical Provider, MD  sodium chloride (OCEAN) 0.65 % nasal spray Place 1 spray into the nose as needed for congestion.   Yes Historical Provider, MD  valsartan (DIOVAN) 80 MG tablet Take 40 mg by mouth daily.    Yes Historical Provider, MD  Vitamins A & D (VITAMIN A & D) ointment Apply 1 application topically as needed (for bed sores.).    Yes Historical Provider, MD     Allergies:  Allergies  Allergen Reactions  . Aspirin     Patient has von Willebrand's disease.  Aspirin prolonged bleeding time  . Ciprofloxacin     Unknown reaction  . Hctz (Hydrochlorothiazide)     rash  . Klotrix (Potassium Chloride)   . Other     Powder on gloves,avoids ASA re-prolonged PTT  . Pravachol     LFT  . Sulfa Antibiotics     Unknown reaction  . Tetracyclines &  Related     Vaginal rash    Social History:   reports that she quit smoking about 24 years ago. She has never used smokeless tobacco. She reports that  drinks alcohol. She reports that she does not use illicit drugs.  Family History: Family History  Problem Relation Age of Onset  . Arthritis Mother   . Arthritis Father   . Heart disease Father   . Hyperlipidemia Father   . Hypertension Father      Physical Exam: Filed Vitals:   09/24/12 2230 09/24/12 2245 09/24/12 2300 09/25/12 0055  BP: 193/72 129/98 152/72 196/79  Pulse: 92 91 93 95  Temp:      TempSrc:      Resp: 23 23 16 18   SpO2: 98% 98% 93% 98%   Blood pressure 196/79, pulse 95, temperature 98 F (36.7 C), temperature source Oral, resp. rate 18, SpO2 98.00%.  GEN:  Pleasant patient lying in the stretcher  in no acute distress; cooperative with exam. PSYCH:  alert and oriented x4; does not appear anxious or depressed; affect is appropriate. HEENT: Mucous membranes pink and anicteric; PERRLA; EOM intact; no cervical lymphadenopathy nor thyromegaly or carotid bruit; no JVD; There were no stridor. Neck is very supple. Breasts:: Not examined CHEST WALL: No tenderness CHEST: Normal respiration, clear to auscultation bilaterally.  HEART: Regular rate and rhythm.  There are no murmur, rub, or gallops.   BACK: No kyphosis or scoliosis; no CVA tenderness ABDOMEN: soft and slightly tender with no rebound. no masses, no organomegaly, normal abdominal bowel sounds; no pannus; no intertriginous candida. There is no rebound and no distention. Rectal Exam: Not done EXTREMITIES: No bone or joint deformity; age-appropriate arthropathy of the hands and knees; no edema; no ulcerations.  There is no calf tenderness. Genitalia: not examined PULSES: 2+ and symmetric SKIN: Normal hydration no rash or ulceration CNS: Cranial nerves 2-12 grossly intact no focal lateralizing neurologic deficit.  Speech is fluent; uvula elevated with phonation,  facial symmetry and tongue midline. DTR are normal bilaterally, cerebella exam is intact, barbinski is negative and strengths are equaled bilaterally.  No sensory loss.   Labs on Admission:  Basic Metabolic Panel:  Recent Labs Lab 09/24/12 2000  NA 143  K 3.4*  CL 99  CO2 32  GLUCOSE 157*  BUN 11  CREATININE 0.68  CALCIUM 8.8   Liver Function Tests:  Recent Labs Lab 09/24/12 2000  AST 23  ALT 15  ALKPHOS 58  BILITOT 0.3  PROT 6.9  ALBUMIN 3.7    Recent Labs Lab 09/24/12 2000  LIPASE 14   No results found for this basename: AMMONIA,  in the last 168 hours CBC:  Recent Labs Lab 09/24/12 2000  WBC 16.0*  NEUTROABS 14.7*  HGB 11.4*  HCT 34.9*  MCV 93.3  PLT 175   Cardiac Enzymes: No results found for this basename: CKTOTAL, CKMB, CKMBINDEX, TROPONINI,  in the last 168 hours  CBG: No results found for this basename: GLUCAP,  in the last 168 hours   Radiological Exams on Admission: Dg Chest 1 View  09/24/2012  *RADIOLOGY REPORT*  Clinical Data: Epigastric pain and cough.  CHEST - 1 VIEW  Comparison: 08/11/2012  Findings: Stable cardiomegaly and underlying chronic lung disease. No edema, focal consolidation or pleural fluid is identified. Fracture of proximal right humerus shows poor healing.  IMPRESSION: Stable cardiomegaly and chronic lung disease.  No acute findings. Fracture of proximal right humerus again noted.   Original Report Authenticated By: Irish Lack, M.D.    Ct Abdomen Pelvis W Contrast  09/24/2012  *RADIOLOGY REPORT*  Clinical Data: Abdominal pain, distention, nausea and vomiting. History of small bowel obstruction and diverticulosis.  CT ABDOMEN AND PELVIS WITH CONTRAST  Technique:  Multidetector CT imaging of the abdomen and pelvis was performed following the standard protocol during bolus administration of intravenous contrast.  Contrast: OMNIPAQUE IOHEXOL 300 MG/ML  SOLN  Comparison: 04/14/2004  Findings: Dilated and fluid-filled small  bowel loops are present involving several jejunal loops.  Maximal diameter of small bowel loops is approximately 3.8 cm.  There is associated adjacent mesenteric edema.  No pneumatosis, perforation or abscess is identified.  The colon is decompressed.  There is diverticulosis of the sigmoid colon without evidence of acute diverticulitis.  No significant free fluid is identified.  The liver, spleen, pancreas and adrenal glands are within normal limits.  Multiple small cysts are noted in both kidneys.  There is  no evidence of hydronephrosis.  The abdominal aorta shows atherosclerotic plaque without evidence of aneurysm.  No bony abnormalities are identified.  No hernias, focal masses or enlarged lymph nodes are identified. The bladder is unremarkable.  IMPRESSION: Partial small bowel obstruction with dilated mid jejunal loops present.   Original Report Authenticated By: Irish Lack, M.D.     Assessment/Plan Present on Admission:  . SBO (small bowel obstruction) . Von Willebrand's disease . DIVERTICULOSIS, COLON . CORONARY ARTERY DISEASE . Adjustment disorder with anxiety . Hyperlipidemia . OSTEOPOROSIS Right humerus fracture.  PLAN:  This patient has partial SBO with no evidence of a transition point nor evidence of perforation.  Will admit her for IVF, bowel rest, and GT placement.  I suspect she has adhesion.  She had several abdominal surgeries in the past along with prior hx of SBO as well.  I have continued her on her home medication.  I had admitted her for right humerus fracture before, so I have known her.  She is a DNR and we will honor her wishes.  She is stable, and will be admitted to [facility] service.    Other plans as per orders.  Code Status: DNR   Houston Siren, MD. [facility] Pager [phone] 7pm to 7am.  09/25/2012, 1:27 AM

## 2012-09-25 NOTE — Progress Notes (Signed)
DNR.  Uncertain if admission is related at this time.  Patient under hospice services with dx of COPD.  Patient awake and verbal during this visit.  Denies any pain at present.  No family at bedside.  Chart reviewed and spoke with primary RN.  Please contact [facility] [phone] with any questions or concerns.  Willette Pa, RN [facility]

## 2012-09-25 NOTE — Progress Notes (Signed)
[facility] PROGRESS NOTE  [name] WUJ:[id number] DOB: 14-Nov-1927 DOA: 09/24/2012 PCP: Rene Paci, MD  Assessment/Plan: 1. Partial Small Bowel obstruction: NPO, continue with NG tube. IV fluids. Repeat X ray tomorrow. No passing gas.  2. Stage gold D COPD: Stable. On home hospice. Continue with albuterol PRN.   Von Willebrand's disease DIVERTICULOSIS, COLON CORONARY ARTERY DISEASE Adjustment disorder with anxiety Hyperlipidemia    Code Status: DNR Family Communication: Care discussed with patient.  Disposition Plan: to be determine.    Consultants:  none  Procedures:  NG tube.   Antibiotics:  none  HPI/Subjective: No worsening abdominal pain. Feeling a litter better. Ng tube in place. No passing gas.   Objective: Filed Vitals:   09/25/12 0115 09/25/12 0632 09/25/12 1001 09/25/12 1414  BP: 160/79 134/63 145/72 138/70  Pulse: 96 97 88 80  Temp: 98 F (36.7 C) 98.6 F (37 C) 98.4 F (36.9 C) 98.2 F (36.8 C)  TempSrc: Oral Oral Oral Oral  Resp: 18 18 16 16   Height: 4\' 11"  (1.499 m)     Weight: 68 kg (149 lb 14.6 oz)     SpO2: 98% 97% 96% 97%    Intake/Output Summary (Last 24 hours) at 09/25/12 1656 Last data filed at 09/25/12 1613  Gross per 24 hour  Intake  262.5 ml  Output   1500 ml  Net -1237.5 ml   Filed Weights   09/25/12 0115  Weight: 68 kg (149 lb 14.6 oz)    Exam:   General:  No distress.  Cardiovascular: S 1, S 2 RRR  Respiratory: no wheezes.  Abdomen: decreases BS. Mild generalized tenderness, no rigidity.   Musculoskeletal: no edema.   Data Reviewed: Basic Metabolic Panel:  Recent Labs Lab 09/24/12 2000  NA 143  K 3.4*  CL 99  CO2 32  GLUCOSE 157*  BUN 11  CREATININE 0.68  CALCIUM 8.8   Liver Function Tests:  Recent Labs Lab 09/24/12 2000  AST 23  ALT 15  ALKPHOS 58  BILITOT 0.3  PROT 6.9  ALBUMIN 3.7    Recent Labs Lab 09/24/12 2000  LIPASE 14   No results found for this  basename: AMMONIA,  in the last 168 hours CBC:  Recent Labs Lab 09/24/12 2000  WBC 16.0*  NEUTROABS 14.7*  HGB 11.4*  HCT 34.9*  MCV 93.3  PLT 175   Cardiac Enzymes: No results found for this basename: CKTOTAL, CKMB, CKMBINDEX, TROPONINI,  in the last 168 hours BNP (last 3 results)  Recent Labs  08/11/12 2015  PROBNP 976.9*   CBG: No results found for this basename: GLUCAP,  in the last 168 hours  No results found for this or any previous visit (from the past 240 hour(s)).   Studies: Dg Chest 1 View  09/24/2012  *RADIOLOGY REPORT*  Clinical Data: Epigastric pain and cough.  CHEST - 1 VIEW  Comparison: 08/11/2012  Findings: Stable cardiomegaly and underlying chronic lung disease. No edema, focal consolidation or pleural fluid is identified. Fracture of proximal right humerus shows poor healing.  IMPRESSION: Stable cardiomegaly and chronic lung disease.  No acute findings. Fracture of proximal right humerus again noted.   Original Report Authenticated By: Irish Lack, M.D.    Ct Abdomen Pelvis W Contrast  09/24/2012  *RADIOLOGY REPORT*  Clinical Data: Abdominal pain, distention, nausea and vomiting. History of small bowel obstruction and diverticulosis.  CT ABDOMEN AND PELVIS WITH CONTRAST  Technique:  Multidetector CT imaging of the abdomen and pelvis was  performed following the standard protocol during bolus administration of intravenous contrast.  Contrast: OMNIPAQUE IOHEXOL 300 MG/ML  SOLN  Comparison: 04/14/2004  Findings: Dilated and fluid-filled small bowel loops are present involving several jejunal loops.  Maximal diameter of small bowel loops is approximately 3.8 cm.  There is associated adjacent mesenteric edema.  No pneumatosis, perforation or abscess is identified.  The colon is decompressed.  There is diverticulosis of the sigmoid colon without evidence of acute diverticulitis.  No significant free fluid is identified.  The liver, spleen, pancreas and adrenal glands  are within normal limits.  Multiple small cysts are noted in both kidneys.  There is no evidence of hydronephrosis.  The abdominal aorta shows atherosclerotic plaque without evidence of aneurysm.  No bony abnormalities are identified.  No hernias, focal masses or enlarged lymph nodes are identified. The bladder is unremarkable.  IMPRESSION: Partial small bowel obstruction with dilated mid jejunal loops present.   Original Report Authenticated By: Irish Lack, M.D.     Scheduled Meds: . ALPRAZolam  1 mg Oral TID  . antiseptic oral rinse  15 mL Mouth Rinse BID  . enoxaparin (LOVENOX) injection  40 mg Subcutaneous Q24H  . isosorbide mononitrate  60 mg Oral Daily  . metoprolol succinate  25 mg Oral BID  . pantoprazole (PROTONIX) IV  40 mg Intravenous Q12H  . PARoxetine  10 mg Oral Daily  . predniSONE  10 mg Oral Daily  . simvastatin  20 mg Oral q1800   Continuous Infusions: . dextrose 5 % and 0.9 % NaCl with KCl 20 mEq/L 1,000 mL (09/25/12 1553)    Principal Problem:   SBO (small bowel obstruction) Active Problems:   Von Willebrand's disease   Adjustment disorder with anxiety   CORONARY ARTERY DISEASE   DIVERTICULOSIS, COLON   OSTEOPOROSIS   SMALL BOWEL OBSTRUCTION, HX OF   CAROTID ENDARTERECTOMY, HX OF   Hyperlipidemia   Shoulder fracture, right    Time spent: 35 minutes.    [name]  [facility] Pager [phone]. If 7PM-7AM, please contact night-coverage at [email or web address], password [facility] 09/25/2012, 4:56 PM  LOS: 1 day

## 2012-09-26 ENCOUNTER — Inpatient Hospital Stay (HOSPITAL_COMMUNITY): Payer: Medicare Other

## 2012-09-26 DIAGNOSIS — R0902 Hypoxemia: Secondary | ICD-10-CM

## 2012-09-26 DIAGNOSIS — J9611 Chronic respiratory failure with hypoxia: Secondary | ICD-10-CM | POA: Diagnosis present

## 2012-09-26 DIAGNOSIS — J9610 Chronic respiratory failure, unspecified whether with hypoxia or hypercapnia: Secondary | ICD-10-CM

## 2012-09-26 LAB — CBC
HCT: 31.5 % — ABNORMAL LOW (ref 36.0–46.0)
Hemoglobin: 10.1 g/dL — ABNORMAL LOW (ref 12.0–15.0)
MCHC: 32.1 g/dL (ref 30.0–36.0)
MCV: 94.9 fL (ref 78.0–100.0)
RDW: 13.3 % (ref 11.5–15.5)
WBC: 8.5 10*3/uL (ref 4.0–10.5)

## 2012-09-26 LAB — BASIC METABOLIC PANEL
BUN: 4 mg/dL — ABNORMAL LOW (ref 6–23)
Chloride: 103 mEq/L (ref 96–112)
Creatinine, Ser: 0.71 mg/dL (ref 0.50–1.10)
GFR calc Af Amer: >90 mL/min (ref >90)
Glucose, Bld: 122 mg/dL — ABNORMAL HIGH (ref 70–99)

## 2012-09-26 MED ORDER — ARFORMOTEROL TARTRATE 15 MCG/2ML IN NEBU
15.0000 ug | INHALATION_SOLUTION | Freq: Two times a day (BID) | RESPIRATORY_TRACT | Status: DC
  Administered 2012-09-26 – 2012-09-29 (×7): 15 ug via RESPIRATORY_TRACT
  Filled 2012-09-26 (×11): qty 2

## 2012-09-26 MED ORDER — ALBUTEROL SULFATE (5 MG/ML) 0.5% IN NEBU
2.5000 mg | INHALATION_SOLUTION | RESPIRATORY_TRACT | Status: DC
  Administered 2012-09-26 – 2012-09-29 (×18): 2.5 mg via RESPIRATORY_TRACT
  Filled 2012-09-26 (×18): qty 0.5

## 2012-09-26 MED ORDER — PAROXETINE HCL 10 MG PO TABS
10.0000 mg | ORAL_TABLET | Freq: Every day | ORAL | Status: DC
  Administered 2012-09-26 – 2012-09-29 (×4): 10 mg via ORAL
  Filled 2012-09-26 (×5): qty 1

## 2012-09-26 MED ORDER — KCL IN DEXTROSE-NACL 20-5-0.2 MEQ/L-%-% IV SOLN
INTRAVENOUS | Status: DC
  Administered 2012-09-26: 13:00:00 via INTRAVENOUS
  Administered 2012-09-27: 75 mL/h via INTRAVENOUS
  Administered 2012-09-27: 07:00:00 via INTRAVENOUS
  Filled 2012-09-26 (×6): qty 1000

## 2012-09-26 MED ORDER — DEXTROSE-NACL 5-0.45 % IV SOLN: INTRAVENOUS | Status: DC

## 2012-09-26 MED ORDER — POTASSIUM CHLORIDE 10 MEQ/100ML IV SOLN
10.0000 meq | INTRAVENOUS | Status: AC
  Administered 2012-09-26 (×2): 10 meq via INTRAVENOUS
  Filled 2012-09-26 (×2): qty 100

## 2012-09-26 MED ORDER — NITROGLYCERIN 0.4 MG SL SUBL: 0.4000 mg | SUBLINGUAL_TABLET | SUBLINGUAL | Status: DC | PRN

## 2012-09-26 MED ORDER — ARFORMOTEROL TARTRATE 15 MCG/2ML IN NEBU: 15.0000 ug | INHALATION_SOLUTION | Freq: Two times a day (BID) | RESPIRATORY_TRACT | Status: DC

## 2012-09-26 MED ORDER — NITROGLYCERIN 0.4 MG SL SUBL
SUBLINGUAL_TABLET | SUBLINGUAL | Status: AC
  Filled 2012-09-26: qty 25

## 2012-09-26 MED ORDER — TIOTROPIUM BROMIDE MONOHYDRATE 18 MCG IN CAPS
18.0000 ug | ORAL_CAPSULE | Freq: Every day | RESPIRATORY_TRACT | Status: DC
  Administered 2012-09-26 – 2012-09-29 (×4): 18 ug via RESPIRATORY_TRACT
  Filled 2012-09-26 (×3): qty 5

## 2012-09-26 MED ORDER — NITROGLYCERIN 0.4 MG SL SUBL
0.4000 mg | SUBLINGUAL_TABLET | Freq: Once | SUBLINGUAL | Status: AC
  Administered 2012-09-26: 0.4 mg via SUBLINGUAL

## 2012-09-26 MED ORDER — IPRATROPIUM BROMIDE 0.02 % IN SOLN
0.5000 mg | RESPIRATORY_TRACT | Status: DC
  Administered 2012-09-26: 0.5 mg via RESPIRATORY_TRACT
  Filled 2012-09-26: qty 2.5

## 2012-09-26 NOTE — Progress Notes (Signed)
Pt has done well with NG tube being clamped since this AM.  No complaints of nausea and pain only when abd is pressed or when moving.  Will cont to monitor

## 2012-09-26 NOTE — Progress Notes (Signed)
[facility] PROGRESS NOTE  [name] JYN:[id number] DOB: May 26, 1928 DOA: 09/24/2012 PCP: Rene Paci, MD  Assessment/Plan: 1. Partial Small Bowel obstruction: NPO, continue with NG tube. Will try to clamp NG tube for few hours to see respond. Continue with  IV fluids. Repeat X ray 4-6, non obstructive Bowel gas pattern. Patient is passing gas today. No worsening abdominal pain. Will replete potassium.  Check Mg level. WBC decrease to 8.6 from 16. Ambulate patient as possible. Limit narcotics. 2. Stage gold D COPD: Patient was complaining of SOB this morning. After nebulizer treatment, her dyspnea has improve. Appreciate Dr Delford Field visit. Bronava and spiriva was ordered.  3. CAD: Continue with Metoprolol, Imdur.   Von Willebrand's disease DIVERTICULOSIS, COLON Adjustment disorder with anxiety Hyperlipidemia    Code Status: DNR Family Communication: Care discussed with patient.  Disposition Plan: will consult SW to help with disposition at time of discharge. Will consult PT, OT.    Consultants:  none  Procedures:  NG tube.   Antibiotics:  none  HPI/Subjective: No worsening abdominal pain. Feeling a litter better after nebulizer treatment. Ng tube in place. Passing gas.   Objective: Filed Vitals:   09/25/12 1414 09/25/12 1800 09/25/12 2120 09/26/12 0515  BP: 138/70 140/68 156/67 167/72  Pulse: 80 82 74 84  Temp: 98.2 F (36.8 C) 98.1 F (36.7 C) 98.8 F (37.1 C) 98.3 F (36.8 C)  TempSrc: Oral Oral Oral   Resp: 16 18 18 19   Height:      Weight:      SpO2: 97% 98% 100% 92%    Intake/Output Summary (Last 24 hours) at 09/26/12 1045 Last data filed at 09/26/12 0607  Gross per 24 hour  Intake     30 ml  Output    350 ml  Net   -320 ml   Filed Weights   09/25/12 0115  Weight: 68 kg (149 lb 14.6 oz)    Exam:   General:  No distress.  Cardiovascular: S 1, S 2 RRR  Respiratory: no wheezes.  Abdomen: BS present today. Mild generalized  tenderness, no rigidity.   Musculoskeletal: no edema.   Data Reviewed: Basic Metabolic Panel:  Recent Labs Lab 09/24/12 2000 09/25/12 1716 09/26/12 0623  NA 143 140 142  K 3.4* 3.6 3.3*  CL 99 100 103  CO2 32 34* 33*  GLUCOSE 157* 155* 122*  BUN 11 7 4*  CREATININE 0.68 0.67 0.71  CALCIUM 8.8 8.2* 8.5   Liver Function Tests:  Recent Labs Lab 09/24/12 2000  AST 23  ALT 15  ALKPHOS 58  BILITOT 0.3  PROT 6.9  ALBUMIN 3.7    Recent Labs Lab 09/24/12 2000  LIPASE 14   No results found for this basename: AMMONIA,  in the last 168 hours CBC:  Recent Labs Lab 09/24/12 2000 09/26/12 0623  WBC 16.0* 8.5  NEUTROABS 14.7*  --   HGB 11.4* 10.1*  HCT 34.9* 31.5*  MCV 93.3 94.9  PLT 175 167   Cardiac Enzymes: No results found for this basename: CKTOTAL, CKMB, CKMBINDEX, TROPONINI,  in the last 168 hours BNP (last 3 results)  Recent Labs  08/11/12 2015  PROBNP 976.9*   CBG: No results found for this basename: GLUCAP,  in the last 168 hours  No results found for this or any previous visit (from the past 240 hour(s)).   Studies: Dg Chest 1 View  09/24/2012  *RADIOLOGY REPORT*  Clinical Data: Epigastric pain and cough.  CHEST - 1  VIEW  Comparison: 08/11/2012  Findings: Stable cardiomegaly and underlying chronic lung disease. No edema, focal consolidation or pleural fluid is identified. Fracture of proximal right humerus shows poor healing.  IMPRESSION: Stable cardiomegaly and chronic lung disease.  No acute findings. Fracture of proximal right humerus again noted.   Original Report Authenticated By: Irish Lack, M.D.    Dg Abd 1 View  09/26/2012  *RADIOLOGY REPORT*  Clinical Data: Small bowel obstruction  ABDOMEN - 1 VIEW  Comparison: CT 09/24/2012  Findings: Nasogastric tube has been placed into the decompressed stomach.  There is a paucity of small bowel gas.  Residual oral contrast material in the decompressed colon.  No abnormal abdominal calcifications.   Regional bones unremarkable.  IMPRESSION:  1.  Nasogastric tube to the decompressed stomach. 2.  Nonobstructive bowel gas pattern.   Original Report Authenticated By: D. Andria Rhein, MD    Ct Abdomen Pelvis W Contrast  09/24/2012  *RADIOLOGY REPORT*  Clinical Data: Abdominal pain, distention, nausea and vomiting. History of small bowel obstruction and diverticulosis.  CT ABDOMEN AND PELVIS WITH CONTRAST  Technique:  Multidetector CT imaging of the abdomen and pelvis was performed following the standard protocol during bolus administration of intravenous contrast.  Contrast: OMNIPAQUE IOHEXOL 300 MG/ML  SOLN  Comparison: 04/14/2004  Findings: Dilated and fluid-filled small bowel loops are present involving several jejunal loops.  Maximal diameter of small bowel loops is approximately 3.8 cm.  There is associated adjacent mesenteric edema.  No pneumatosis, perforation or abscess is identified.  The colon is decompressed.  There is diverticulosis of the sigmoid colon without evidence of acute diverticulitis.  No significant free fluid is identified.  The liver, spleen, pancreas and adrenal glands are within normal limits.  Multiple small cysts are noted in both kidneys.  There is no evidence of hydronephrosis.  The abdominal aorta shows atherosclerotic plaque without evidence of aneurysm.  No bony abnormalities are identified.  No hernias, focal masses or enlarged lymph nodes are identified. The bladder is unremarkable.  IMPRESSION: Partial small bowel obstruction with dilated mid jejunal loops present.   Original Report Authenticated By: Irish Lack, M.D.     Scheduled Meds: . albuterol  2.5 mg Nebulization Q4H  . ALPRAZolam  1 mg Oral TID  . antiseptic oral rinse  15 mL Mouth Rinse BID  . arformoterol  15 mcg Nebulization BID  . enoxaparin (LOVENOX) injection  40 mg Subcutaneous Q24H  . isosorbide mononitrate  60 mg Oral Daily  . metoprolol succinate  25 mg Oral BID  . pantoprazole (PROTONIX) IV   40 mg Intravenous Q12H  . potassium chloride  10 mEq Intravenous Q1 Hr x 4  . predniSONE  10 mg Oral Daily  . tiotropium  18 mcg Inhalation Daily   Continuous Infusions: . dextrose 5 % and 0.9 % NaCl with KCl 20 mEq/L 75 mL/hr at 09/26/12 0607    Principal Problem:   SBO (small bowel obstruction) Active Problems:   Von Willebrand's disease   Adjustment disorder with anxiety   CORONARY ARTERY DISEASE   COPD Gold Stage D   DIVERTICULOSIS, COLON   OSTEOPOROSIS   SMALL BOWEL OBSTRUCTION, HX OF   CAROTID ENDARTERECTOMY, HX OF   Hyperlipidemia   Shoulder fracture, right   Chronic respiratory failure with hypoxia    Time spent: 35 minutes.    Fount Bahe  [facility] Pager [phone]. If 7PM-7AM, please contact night-coverage at [email or web address], password [facility] 09/26/2012, 10:45 AM  LOS: 2 days

## 2012-09-26 NOTE — Progress Notes (Signed)
Patient admitted with small bowel obstruction, nausea.  DNR on chart.  Found patient awake and alert, talking on the phone.  Patient noted some shortness of breath this morning, but this has resolved with a breathing treatment.  Patient did pass a small amount of stool last night and remains on IV fluids with bowel rest for SBO.  Continue current plan of care and anticipate discharge home with continued Hospice services.  Encouraged a call to Hospice at [phone] with any needs.  April Costella Hatcher, RN, BSN Hospice

## 2012-09-26 NOTE — Consult Note (Addendum)
PULMONARY  / CRITICAL CARE MEDICINE  Name: [name] MRN: [id number] DOB: May 01, 1928    ADMISSION DATE:  09/24/2012 CONSULTATION DATE:  09/26/12  REFERRING MD :  [facility] hospitalist PRIMARY SERVICE:  [facility] hospitalist  CHIEF COMPLAINT:  Dyspnea and abdominal pain  BRIEF PATIENT DESCRIPTION: 79 year old white female with gold stage D. COPD and intermittent small bowel obstruction. The patient was admitted on 09/25/2012 with recurrent small bowel obstruction. Pulmonary was asked to assist with pulmonary management. Note the patient is in hospice and isn't DO NOT INTUBATE DO NOT RESUSCITATE established  SIGNIFICANT EVENTS / STUDIES:  Abdominal CT scan for 414 reveals partial small bowel obstruction  LINES / TUBES: PIV  CULTURES: None  ANTIBIOTICS: None  HISTORY OF PRESENT ILLNESS:  This is an 79 year old white female well known to me with gold stage D. COPD and intermittent partial small bowel obstruction. The patient is in hospice and is an established DO NOT INTUBATE DO NOT RESUSCITATE. The patient was admitted on 09/25/2012 to the hospitalist service with recurrent small bowel obstruction. CT scan the chest does reveal his a partial small bowel obstruction. The patient is being managed conservatively and is not a surgical candidate. The patient is not getting her scheduled Brovana or Spiriva Pulmonary was asked to consult. Note her past history is significant for carotid vascular disease and coronary artery disease.  PAST MEDICAL HISTORY :  Past Medical History  Diagnosis Date  . Von Willebrand's disease   . CORONARY ARTERY DISEASE   . DIVERTICULOSIS, COLON   . CEREBROVASCULAR ACCIDENT, HX OF 1991  . SMALL BOWEL OBSTRUCTION, HX OF   . CAROTID ENDARTERECTOMY, HX OF 06/23/1992  . COPD     end stage, O2 dep  . OSTEOPOROSIS   . HYPERTENSION   . GERD   . Depression   . Hyperlipidemia   . Shoulder fracture, right   . Asthma   . CHF (congestive heart failure)    Past  Surgical History  Procedure Laterality Date  . Cholecystectomy  1965  . Appendectomy  1965  . Exploratory laparotomy  1965    peritonitis due to ruptured appx   Prior to Admission medications   Medication Sig Start Date End Date Taking? Authorizing Provider  acetaminophen (TYLENOL) 500 MG tablet Take 1,000 mg by mouth every 6 (six) hours as needed for pain.   Yes Historical Provider, MD  albuterol (PROVENTIL HFA;VENTOLIN HFA) 108 (90 BASE) MCG/ACT inhaler Inhale 2 puffs into the lungs every 4 (four) hours as needed for shortness of breath.   Yes Historical Provider, MD  ALPRAZolam Prudy Feeler) 1 MG tablet Take 1 mg by mouth every 4 (four) hours as needed for anxiety.   Yes Historical Provider, MD  ALPRAZolam Prudy Feeler) 1 MG tablet Take 1 mg by mouth 3 (three) times daily.   Yes Historical Provider, MD  amLODipine (NORVASC) 5 MG tablet Take 1 tablet (5 mg total) by mouth daily. Hold if SBP < 100. 09/23/12  Yes Cassell Clement, MD  esomeprazole (NEXIUM) 40 MG capsule Take 40 mg by mouth daily before breakfast.   Yes Historical Provider, MD  furosemide (LASIX) 40 MG tablet Take 1 tablet (40 mg total) by mouth daily. 06/17/12  Yes Cassell Clement, MD  ipratropium-albuterol (DUONEB) 0.5-2.5 (3) MG/3ML SOLN Take 3 mLs by nebulization every 3 (three) hours as needed (for shortness of breath.).   Yes Historical Provider, MD  isosorbide mononitrate (IMDUR) 60 MG 24 hr tablet Take 1 tablet (60 mg total) by mouth daily.  07/22/12  Yes Cassell Clement, MD  lovastatin (MEVACOR) 40 MG tablet Take 1 tablet (40 mg total) by mouth at bedtime. 09/03/12  Yes Cassell Clement, MD  metoprolol succinate (TOPROL-XL) 25 MG 24 hr tablet Take 25 mg by mouth 2 (two) times daily.   Yes Historical Provider, MD  morphine 20 MG/5ML solution Take 10 mg by mouth every 4 (four) hours as needed for pain.   Yes Historical Provider, MD  nitroGLYCERIN (NITROSTAT) 0.4 MG SL tablet Place 0.4 mg under the tongue every 5 (five) minutes as needed  for chest pain.   Yes Historical Provider, MD  PARoxetine (PAXIL) 10 MG tablet Take 10 mg by mouth daily.   Yes Historical Provider, MD  polyethylene glycol (MIRALAX / GLYCOLAX) packet Take 17 g by mouth daily.   Yes Historical Provider, MD  predniSONE (DELTASONE) 10 MG tablet Take 10 mg by mouth daily.   Yes Historical Provider, MD  prochlorperazine (COMPAZINE) 10 MG tablet Take 10 mg by mouth every 6 (six) hours as needed (for nausea.).   Yes Historical Provider, MD  senna-docusate (SENOKOT-S) 8.6-50 MG per tablet Take 1 tablet by mouth at bedtime.   Yes Historical Provider, MD  sodium chloride (OCEAN) 0.65 % nasal spray Place 1 spray into the nose as needed for congestion.   Yes Historical Provider, MD  valsartan (DIOVAN) 80 MG tablet Take 40 mg by mouth daily.    Yes Historical Provider, MD  Vitamins A & D (VITAMIN A & D) ointment Apply 1 application topically as needed (for bed sores.).    Yes Historical Provider, MD   Allergies  Allergen Reactions  . Aspirin     Patient has von Willebrand's disease.  Aspirin prolonged bleeding time  . Ciprofloxacin     Unknown reaction  . Hctz (Hydrochlorothiazide)     rash  . Klotrix (Potassium Chloride)   . Other     Powder on gloves,avoids ASA re-prolonged PTT  . Pravachol     LFT  . Sulfa Antibiotics     Unknown reaction  . Tetracyclines & Related     Vaginal rash    FAMILY HISTORY:  Family History  Problem Relation Age of Onset  . Arthritis Mother   . Arthritis Father   . Heart disease Father   . Hyperlipidemia Father   . Hypertension Father    SOCIAL HISTORY:  reports that she quit smoking about 24 years ago. She has never used smokeless tobacco. She reports that  drinks alcohol. She reports that she does not use illicit drugs.  REVIEW OF SYSTEMS:   Positives in BOLD Constitutional: Negative for fever, chills, weight loss, malaise/fatigue and diaphoresis.  HENT: Negative for hearing loss, ear pain, nosebleeds, congestion, sore  throat, neck pain, tinnitus and ear discharge.   Eyes: Negative for blurred vision, double vision, photophobia, pain, discharge and redness.  Respiratory: cough, hemoptysis, sputum production, shortness of breath, wheezing and NO stridor.   Cardiovascular: Negative for chest pain, palpitations, orthopnea, claudication, leg swelling and PND.  Gastrointestinal: Negative for heartburn, nausea, vomiting, abdominal pain, NO diarrhea, constipation, blood in stool and melena.  Genitourinary: Negative for dysuria, urgency, frequency, hematuria and flank pain.  Musculoskeletal: Negative for myalgias, back pain, joint pain and falls.  Skin: Negative for itching and rash.  Neurological: Negative for dizziness, tingling, tremors, sensory change, speech change, focal weakness, seizures, loss of consciousness, weakness and headaches.  Endo/Heme/Allergies: Negative for environmental allergies and polydipsia. Does not bruise/bleed easily.  SUBJECTIVE:   VITAL  SIGNS: Temp:  [98.1 F (36.7 C)-98.8 F (37.1 C)] 98.3 F (36.8 C) (04/06 0515) Pulse Rate:  [74-84] 84 (04/06 0515) Resp:  [16-19] 19 (04/06 0515) BP: (138-167)/(67-72) 167/72 mmHg (04/06 0515) SpO2:  [92 %-100 %] 92 % (04/06 0515)  PHYSICAL EXAMINATION: General:  Elderly female in no acute distress Neuro:  Intact and nonfocal HEENT:  Dry mucous membranes NG tube in place Neck:  Supple no lymphadenopathy no thyromegaly Cardiovascular:  Regular rate and rhythm normal S1-S2 no S3 or S4 no murmur rub heave or gallop Lungs:  Distant breath sounds Abdomen:  Distended tympanitic abdomen tender right and left lower quadrants with guarding but no rebound Musculoskeletal:  Full range of motion no joint deformities Skin:  Intact   Recent Labs Lab 09/24/12 2000 09/25/12 1716 09/26/12 0623  NA 143 140 142  K 3.4* 3.6 3.3*  CL 99 100 103  CO2 32 34* 33*  BUN 11 7 4*  CREATININE 0.68 0.67 0.71  GLUCOSE 157* 155* 122*    Recent Labs Lab  09/24/12 2000 09/26/12 0623  HGB 11.4* 10.1*  HCT 34.9* 31.5*  WBC 16.0* 8.5  PLT 175 167   Dg Chest 1 View  09/24/2012  *RADIOLOGY REPORT*  Clinical Data: Epigastric pain and cough.  CHEST - 1 VIEW  Comparison: 08/11/2012  Findings: Stable cardiomegaly and underlying chronic lung disease. No edema, focal consolidation or pleural fluid is identified. Fracture of proximal right humerus shows poor healing.  IMPRESSION: Stable cardiomegaly and chronic lung disease.  No acute findings. Fracture of proximal right humerus again noted.   Original Report Authenticated By: Irish Lack, M.D.    Dg Abd 1 View  09/26/2012  *RADIOLOGY REPORT*  Clinical Data: Small bowel obstruction  ABDOMEN - 1 VIEW  Comparison: CT 09/24/2012  Findings: Nasogastric tube has been placed into the decompressed stomach.  There is a paucity of small bowel gas.  Residual oral contrast material in the decompressed colon.  No abnormal abdominal calcifications.  Regional bones unremarkable.  IMPRESSION:  1.  Nasogastric tube to the decompressed stomach. 2.  Nonobstructive bowel gas pattern.   Original Report Authenticated By: D. Andria Rhein, MD    Ct Abdomen Pelvis W Contrast  09/24/2012  *RADIOLOGY REPORT*  Clinical Data: Abdominal pain, distention, nausea and vomiting. History of small bowel obstruction and diverticulosis.  CT ABDOMEN AND PELVIS WITH CONTRAST  Technique:  Multidetector CT imaging of the abdomen and pelvis was performed following the standard protocol during bolus administration of intravenous contrast.  Contrast: OMNIPAQUE IOHEXOL 300 MG/ML  SOLN  Comparison: 04/14/2004  Findings: Dilated and fluid-filled small bowel loops are present involving several jejunal loops.  Maximal diameter of small bowel loops is approximately 3.8 cm.  There is associated adjacent mesenteric edema.  No pneumatosis, perforation or abscess is identified.  The colon is decompressed.  There is diverticulosis of the sigmoid colon without  evidence of acute diverticulitis.  No significant free fluid is identified.  The liver, spleen, pancreas and adrenal glands are within normal limits.  Multiple small cysts are noted in both kidneys.  There is no evidence of hydronephrosis.  The abdominal aorta shows atherosclerotic plaque without evidence of aneurysm.  No bony abnormalities are identified.  No hernias, focal masses or enlarged lymph nodes are identified. The bladder is unremarkable.  IMPRESSION: Partial small bowel obstruction with dilated mid jejunal loops present.   Original Report Authenticated By: Irish Lack, M.D.     ASSESSMENT / PLAN: Principal Problem:  SBO (small bowel obstruction) Active Problems:   Von Willebrand's disease   Adjustment disorder with anxiety   CORONARY ARTERY DISEASE   COPD Gold Stage D   DIVERTICULOSIS, COLON   OSTEOPOROSIS   SMALL BOWEL OBSTRUCTION, HX OF   CAROTID ENDARTERECTOMY, HX OF   Hyperlipidemia   Shoulder fracture, right   Chronic respiratory failure with hypoxia   #1 gold stage D. COPD with associated chronic respiratory failure now compensated  note this patient is an established DO NOT INTUBATE and DO NOT RESUSCITATE   Plan: Begin Brovana, and Spiriva   Continue oxygen therapy  #2 partial small bowel obstruction   Plan:   This patient's previously been followed by   lower GI in we may need their assistance in   this case    I. agree with the current conservative plan as   outlined by hospitalist note this patient's not a   surgical candidate   This patient has been cared for at [facility] in the past and this may be an appropriate transitional care plan once the patient is discharged if she improves   Caryl Bis  [phone]  Cell  [phone]  If no response or cell goes to voicemail, call beeper [phone]  Pulmonary and Critical Care Medicine [facility] Pager: [phone]  09/26/2012, 10:35 AM

## 2012-09-26 NOTE — Progress Notes (Signed)
Called to patients room. Patient complaining of chest pain and asking for Nitroglycerin SL. VS as ff: 152/85-103-22-84% sat .   Nitro SL given with relief as per patient. MD on call paged. Orders given and carried out. Will continue to monitor.

## 2012-09-27 ENCOUNTER — Encounter (HOSPITAL_COMMUNITY): Payer: Self-pay | Admitting: Physician Assistant

## 2012-09-27 LAB — CBC
HCT: 30.7 % — ABNORMAL LOW (ref 36.0–46.0)
MCH: 31.2 pg (ref 26.0–34.0)
MCHC: 33.6 g/dL (ref 30.0–36.0)
RDW: 13.2 % (ref 11.5–15.5)

## 2012-09-27 LAB — BASIC METABOLIC PANEL
BUN: 5 mg/dL — ABNORMAL LOW (ref 6–23)
Calcium: 9 mg/dL (ref 8.4–10.5)
Creatinine, Ser: 0.64 mg/dL (ref 0.50–1.10)
GFR calc Af Amer: >90 mL/min (ref >90)
GFR calc non Af Amer: 81 mL/min — ABNORMAL LOW (ref >90)
Potassium: 4.2 mEq/L (ref 3.5–5.1)

## 2012-09-27 MED ORDER — MAGNESIUM SULFATE 50 % IJ SOLN: 2.0000 g | Freq: Once | INTRAMUSCULAR | Status: DC

## 2012-09-27 MED ORDER — BISACODYL 10 MG RE SUPP
10.0000 mg | Freq: Once | RECTAL | Status: AC
  Administered 2012-09-27: 10 mg via RECTAL
  Filled 2012-09-27: qty 1

## 2012-09-27 MED ORDER — MAGNESIUM SULFATE 40 MG/ML IJ SOLN
2.0000 g | Freq: Once | INTRAMUSCULAR | Status: AC
  Administered 2012-09-27: 2 g via INTRAVENOUS
  Filled 2012-09-27: qty 50

## 2012-09-27 NOTE — Progress Notes (Signed)
Nutrition Brief Note  RD consulted for pt to transition home on Low Fiber, bland diet.   Body mass index is 30.26 kg/(m^2). Patient meets criteria for obesity class 1 based on current BMI.   Current diet order is NPO due to SBO.  Pt is followed by hospice and currently receives services at home which is believed to be independent living center at [facility]. Pt has been NPO x3 days  Labs and medications reviewed.   No nutrition interventions warranted at this time. RD to follow for ongoing placement SNF vs [facility] and will facilitate conversation r/t to d/c diet once appropriate.  Loyce Dys, MS RD LDN Clinical Inpatient Dietitian Pager: [phone] Weekend/After hours pager: [phone]

## 2012-09-27 NOTE — Evaluation (Signed)
Occupational Therapy Evaluation Patient Details Name: [name] MRN: [id number] DOB: 1928/01/17 Today's Date: 09/27/2012 Time: 4098-1191 OT Time Calculation (min): 31 min  OT Assessment / Plan / Recommendation Clinical Impression  Pt is 79y/o SBO, whom tolerated simulated ADL/funct mob today. Recommend SNF rehab at current retirement facility. Note pt reports NWB RUE/sling use as well as no overhead shoulder flexion. Currently ambulating on 4 LO2 & HHA LUE w/ Min-mod A. Pt reports increased independence functional mobility w/ RW prior to humerus fx R    OT Assessment  Patient needs continued OT Services    Follow Up Recommendations  SNF;Supervision/Assistance - 24 hour    Barriers to Discharge      Equipment Recommendations  None recommended by OT    Recommendations for Other Services    Frequency  Min 2X/week    Precautions / Restrictions Precautions Precautions: Other (comment) (DNR) Restrictions Weight Bearing Restrictions: Yes RUE Weight Bearing: Non weight bearing Other Position/Activity Restrictions: recent R humerus fx and pt states she is still not allowed to WB through it   Pertinent Vitals/Pain Denies pain   ADL  Eating/Feeding: Performed;Independent Where Assessed - Eating/Feeding: Chair Grooming: Performed;Wash/dry hands;Wash/dry face;Set up Where Assessed - Grooming: Supported sitting Upper Body Bathing: Simulated;Minimal assistance Where Assessed - Upper Body Bathing: Supported sitting Lower Body Bathing: Simulated;Moderate assistance Where Assessed - Lower Body Bathing: Supported sit to stand;Supported sitting Upper Body Dressing: Simulated;Set up;Minimal assistance Where Assessed - Upper Body Dressing: Supported sitting Lower Body Dressing: Moderate assistance;Maximal assistance;Simulated Where Assessed - Lower Body Dressing: Supported sitting Toilet Transfer: Simulated;Minimal Dentist Method: Retail banker: Materials engineer and Hygiene: Simulated;Minimal assistance Where Assessed - Engineer, mining and Hygiene: Sit to stand from 3-in-1 or toilet;Standing Tub/Shower Transfer Method: Not assessed Equipment Used: Gait belt;Other (comment) (LUE HHA (pt NWB RUE secondary humerus fx)) Transfers/Ambulation Related to ADLs: Pt reports that she uses RW prior to recent fall and fx of RUE/humerus & has been performing HHA LUE transfers for last 6 wks. Pt was Min assist transfers and funct mobility initially, but became Mod assist as she began to fatigue. ADL Comments: Pt tolerating functional activity & ADL simulation after assessment today. Recommend SNF rehab at current retirement facility where pt can also get hospice care PRN. Note pt reports NWB RUE &  sling use as well as no overhead shoulder flexion. Currently ambulating on 4 LO2 & HHA LUE w/ Min-mod A. Pt reports increased functional mobility w/ RW prior to humerus fx R.    OT Diagnosis: Generalized weakness;Acute pain  OT Problem List: Decreased activity tolerance;Decreased strength;Decreased knowledge of use of DME or AE;Decreased knowledge of precautions;Impaired UE functional use;Decreased range of motion OT Treatment Interventions: Self-care/ADL training;Therapeutic exercise;Energy conservation;DME and/or AE instruction;Patient/family education;Therapeutic activities   OT Goals Acute Rehab OT Goals OT Goal Formulation: With patient Potential to Achieve Goals: Good ADL Goals Pt Will Perform Grooming: with supervision;Sitting, chair;Sitting, edge of bed;Sitting at sink ADL Goal: Grooming - Progress: Goal set today Pt Will Transfer to Toilet: with supervision;Stand pivot transfer;Ambulation;with DME;3-in-1 ADL Goal: Toilet Transfer - Progress: Goal set today Pt Will Perform Toileting - Clothing Manipulation: with supervision;Sitting on 3-in-1 or toilet;Standing ADL Goal: Toileting -  Clothing Manipulation - Progress: Goal set today Pt Will Perform Toileting - Hygiene: with modified independence;Sitting on 3-in-1 or toilet ADL Goal: Toileting - Hygiene - Progress: Goal set today Additional ADL Goal #1: Pt will I'ly state 2-3 energy conservation tech's  that she can implement during ADL tasks ADL Goal: Additional Goal #1 - Progress: Goal set today Additional ADL Goal #2: Pt will be Mod I RUE ther ex as per MD orders for R humerus fx (dx spprox 6 wks ago - will need to get clarification/precautions). ADL Goal: Additional Goal #2 - Progress: Goal set today  Visit Information  Last OT Received On: 09/27/12 Assistance Needed: +1    Subjective Data  Subjective: Pt reports that she Fx R humerus approx 6 wks ago. Non WB RUE (has sling & No overhead flexion per pt reports). Patient Stated Goal: SNF rehab at facility where pt currently lives.   Prior Functioning     Home Living Lives With: Spouse Available Help at Discharge: Available 24 hours/day Type of Home: Independent living facility Home Access: Other (comment) (Apartment) Home Layout: One level Bathroom Shower/Tub: Walk-in shower;Door;Other (comment) (Grab bars) Bathroom Toilet: Handicapped height Bathroom Accessibility: Yes Home Adaptive Equipment: Bedside commode/3-in-1;Grab bars in shower;Hand-held shower hose;Reacher;Walker - rolling;Built-in shower seat Prior Function Level of Independence:  (Pt had ambulated with RW prior to humerus fx, now with HHA) Needs Assistance: Bathing;Dressing;Light Housekeeping;Meal Prep Bath: Moderate Dressing: Minimal Meal Prep: Other (comment) (Pt gets 2 meals per day at retirement facility) Light Housekeeping: Maximal Driving: No Vocation: Retired Comments: Pt lives at [facility] living facility where 2 meals are prepped per day.. She has hospice 3x/wk for bathing/dressing assistance. Reports that she does toileting I'ly. Pt fractured R humerus approx 6 wks ago. No WB RUE, was  having OT "but it took too much out of me, so hospice stopped it, they don't come anymore" Communication Communication: No difficulties Dominant Hand: Right    Vision/Perception Vision - History Baseline Vision: Wears glasses all the time Visual History: Cataracts Patient Visual Report: No change from baseline   Cognition  Cognition Overall Cognitive Status: Appears within functional limits for tasks assessed/performed Arousal/Alertness: Awake/alert Orientation Level: Appears intact for tasks assessed Behavior During Session: [facility] for tasks performed    Extremity/Trunk Assessment Right Upper Extremity Assessment RUE ROM/Strength/Tone: Deficits;Unable to fully assess RUE ROM/Strength/Tone Deficits: Pt w/ R humerus Fx approx 6 wks ago. "I'm not to put any weight through it, or lift it up in the air/over my head" "I'm also supposed to wear this sling" Pt reports that she has increased functional mobility when able to use RUE for RW use. Left Upper Extremity Assessment LUE ROM/Strength/Tone: Within functional levels;WFL for tasks assessed Right Lower Extremity Assessment RLE ROM/Strength/Tone: [facility] for tasks assessed Left Lower Extremity Assessment LLE ROM/Strength/Tone: [facility] for tasks assessed    Mobility Bed Mobility Bed Mobility: Not assessed Transfers Transfers: Sit to Stand;Stand to Sit Sit to Stand: 4: Min guard;From chair/3-in-1 Stand to Sit: 4: Min assist;3: Mod assist;To chair/3-in-1;With upper extremity assist Details for Transfer Assistance: cues for safety/speed       Balance Balance Balance Assessed: Yes Static Sitting Balance Static Sitting - Balance Support: No upper extremity supported;Feet supported Static Sitting - Level of Assistance: 6: Modified independent (Device/Increase time) Static Standing Balance Static Standing - Balance Support: Left upper extremity supported;During functional activity Static Standing - Level of Assistance: 4: Min assist   End of  Session OT - End of Session Equipment Utilized During Treatment: Gait belt Activity Tolerance: Patient limited by fatigue Patient left: in chair;with call bell/phone within reach;with family/visitor present Nurse Communication: Mobility status  GO     Alm Bustard 09/27/2012, 12:47 PM

## 2012-09-27 NOTE — Progress Notes (Signed)
Room 6N 27 - Chase Caller - [facility] RN Visit-R.Keelin Sheridan RN  NON-COVERED and NON-RELATED admission to [facility] diagnosis of COPD.   Pt is DNR code with OOF DNR on shadow chart.    Pt asleep on first visit - sleeping soundly and did not awaken to verbal stimuli.  At follow up visit, Dr. Sunnie Nielsen was present in room, pt alert & oriented, lying in bed, with no complaints of pain or discomfort.    No family present. Patient's home medication list is on shadow chart.   Pt admitted 4/4 late pm  with SBO, NG tube in place-clamped with possible removal late today per Dr. Sunnie Nielsen.  Pt NPO, on ice shavings only.    Please call [facility] @ [phone]- ask for RN Liaison or after hours,ask for on-call RN with any hospice needs.   Thank you.  Joneen Boers, RN  [facility]  Hospice Liaison

## 2012-09-27 NOTE — Progress Notes (Signed)
[facility] PROGRESS NOTE  [name] OZH:[id number] DOB: [birth year] DOA: 09/24/2012 PCP: Rene Paci, MD  Assessment/Plan: 1. Partial Small Bowel obstruction: NPO.  NG tube was clamp yesterday. Patient denies nausea, no worsening abdominal pain. No Bowel movement. Continue with  IV fluids. Repeat X ray 4-6, non obstructive Bowel gas pattern. Ambulate patient as possible. Limit narcotics. Informed [facility] of patient admission.  2. Stage gold D COPD: On Hospice. Patient is DNR. Continue with Bronava and spiriva. Continue with Nebulizer treatment.  3. CAD: Continue with Metoprolol, Imdur. No chest pain.  4. DVT prophylaxis: SCD. No anticoagulation due to history of Von Willebrand.  5. Leukocytosis: WBC mildly increase today. Repeat in AM. Suspect secondary to SBO.   Von Willebrand's disease DIVERTICULOSIS, COLON Adjustment disorder with anxiety Hyperlipidemia    Code Status: DNR Family Communication: Care discussed with patient.  Disposition Plan: will consult SW to help with disposition at time of discharge. Will consult PT, OT.    Consultants:  none  Procedures:  NG tube.   Antibiotics:  none  HPI/Subjective: No worsening abdominal pain.  Passing gas.   Objective: Filed Vitals:   09/26/12 2055 09/26/12 2105 09/27/12 0027 09/27/12 0553  BP: 152/85 142/63  185/82  Pulse: 102 102  94  Temp: 98.4 F (36.9 C)   98.8 F (37.1 C)  TempSrc: Oral     Resp: 19   19  Height:      Weight:      SpO2: 95% 95% 94% 99%    Intake/Output Summary (Last 24 hours) at 09/27/12 0924 Last data filed at 09/27/12 0805  Gross per 24 hour  Intake      0 ml  Output   2025 ml  Net  -2025 ml   Filed Weights   09/25/12 0115  Weight: 68 kg (149 lb 14.6 oz)    Exam:   General:  No distress.  Cardiovascular: S 1, S 2 RRR  Respiratory: no wheezes.  Abdomen: BS present today. Mild generalized tenderness, no rigidity.   Musculoskeletal: no edema.   Data  Reviewed: Basic Metabolic Panel:  Recent Labs Lab 09/24/12 2000 09/25/12 1716 09/26/12 0623 09/27/12 0645  NA 143 140 142 142  K 3.4* 3.6 3.3* 4.2  CL 99 100 103 101  CO2 32 34* 33* 37*  GLUCOSE 157* 155* 122* 111*  BUN 11 7 4* 5*  CREATININE 0.68 0.67 0.71 0.64  CALCIUM 8.8 8.2* 8.5 9.0  MG  --   --   --  1.4*   Liver Function Tests:  Recent Labs Lab 09/24/12 2000  AST 23  ALT 15  ALKPHOS 58  BILITOT 0.3  PROT 6.9  ALBUMIN 3.7    Recent Labs Lab 09/24/12 2000  LIPASE 14   No results found for this basename: AMMONIA,  in the last 168 hours CBC:  Recent Labs Lab 09/24/12 2000 09/26/12 0623 09/27/12 0645  WBC 16.0* 8.5 11.3*  NEUTROABS 14.7*  --   --   HGB 11.4* 10.1* 10.3*  HCT 34.9* 31.5* 30.7*  MCV 93.3 94.9 93.0  PLT 175 167 182   Cardiac Enzymes: No results found for this basename: CKTOTAL, CKMB, CKMBINDEX, TROPONINI,  in the last 168 hours BNP (last 3 results)  Recent Labs  08/11/12 2015  PROBNP 976.9*   CBG: No results found for this basename: GLUCAP,  in the last 168 hours  No results found for this or any previous visit (from the past 240 hour(s)).  Studies: Dg Abd 1 View  09/26/2012  *RADIOLOGY REPORT*  Clinical Data: Small bowel obstruction  ABDOMEN - 1 VIEW  Comparison: CT 09/24/2012  Findings: Nasogastric tube has been placed into the decompressed stomach.  There is a paucity of small bowel gas.  Residual oral contrast material in the decompressed colon.  No abnormal abdominal calcifications.  Regional bones unremarkable.  IMPRESSION:  1.  Nasogastric tube to the decompressed stomach. 2.  Nonobstructive bowel gas pattern.   Original Report Authenticated By: D. Deanne Coffer III, MD     Scheduled Meds: . albuterol  2.5 mg Nebulization Q4H  . ALPRAZolam  1 mg Oral TID  . antiseptic oral rinse  15 mL Mouth Rinse BID  . arformoterol  15 mcg Nebulization BID  . isosorbide mononitrate  60 mg Oral Daily  . metoprolol succinate  25 mg Oral BID   . pantoprazole (PROTONIX) IV  40 mg Intravenous Q12H  . PARoxetine  10 mg Oral Daily  . predniSONE  10 mg Oral Daily  . tiotropium  18 mcg Inhalation Daily   Continuous Infusions: . dextrose 5 % and 0.2 % NaCl with KCl 20 mEq 75 mL/hr at 09/27/12 0654  . dextrose 5 % and 0.45% NaCl      Principal Problem:   SBO (small bowel obstruction) Active Problems:   Von Willebrand's disease   Adjustment disorder with anxiety   CORONARY ARTERY DISEASE   COPD Gold Stage D   DIVERTICULOSIS, COLON   OSTEOPOROSIS   SMALL BOWEL OBSTRUCTION, HX OF   CAROTID ENDARTERECTOMY, HX OF   Hyperlipidemia   Shoulder fracture, right   Chronic respiratory failure with hypoxia    Time spent: 25 minutes.    Alexis Mizuno  [facility] Pager [phone]. If 7PM-7AM, please contact night-coverage at [email or web address], password [facility] 09/27/2012, 9:24 AM  LOS: 3 days

## 2012-09-27 NOTE — Progress Notes (Signed)
PULMONARY  / CRITICAL CARE MEDICINE  Name: [name] MRN: [id number] DOB: 05/30/1928    ADMISSION DATE:  09/24/2012 CONSULTATION DATE:  09/26/12  REFERRING MD :  [facility] PRIMARY SERVICE:  [facility]  CHIEF COMPLAINT:  Dyspnea and abdominal pain  BRIEF PATIENT DESCRIPTION: 79 year old white female with gold stage D. COPD and intermittent small bowel obstruction. The patient was admitted on 09/25/2012 with recurrent small bowel obstruction. Pulmonary was asked to assist with pulmonary management. Note the patient is in hospice and isn't DO NOT INTUBATE DO NOT RESUSCITATE established  SIGNIFICANT EVENTS / STUDIES:  Abdominal CT scan for 414 reveals partial small bowel obstruction  LINES / TUBES: PIV  CULTURES: None  ANTIBIOTICS: None   SUBJECTIVE:  Pt improved. NG clamped.  No chest pain this am, did get NTG for cp last noc. Pt relates she is eating a different high fiber diet at the [facility] home. She needs to be on a mechanical soft diet with puree foods, soups etc.  No rice or large difficult to digest vegetable or meats d/t tight stricture in small bowel  VITAL SIGNS: Temp:  [98.4 F (36.9 C)-98.8 F (37.1 C)] 98.8 F (37.1 C) (04/07 0553) Pulse Rate:  [94-102] 94 (04/07 0553) Resp:  [19] 19 (04/07 0553) BP: (142-185)/(63-85) 185/82 mmHg (04/07 0553) SpO2:  [94 %-99 %] 99 % (04/07 0553) FiO2 (%):  [96 %] 96 % (04/07 0408)  PHYSICAL EXAMINATION: General:  Elderly female in no acute distress Neuro:  Intact and nonfocal HEENT:  Dry mucous membranes NG tube in place Neck:  Supple no lymphadenopathy no thyromegaly Cardiovascular:  Regular rate and rhythm normal S1-S2 no S3 or S4 no murmur rub heave or gallop Lungs:  Distant breath sounds Abdomen: less Distended tympanitic abdomen tender right and left lower quadrants with guarding but no rebound Musculoskeletal:  Full range of motion no joint deformities Skin:  Intact   Recent Labs Lab  09/25/12 1716 09/26/12 0623 09/27/12 0645  NA 140 142 142  K 3.6 3.3* 4.2  CL 100 103 101  CO2 34* 33* 37*  BUN 7 4* 5*  CREATININE 0.67 0.71 0.64  GLUCOSE 155* 122* 111*    Recent Labs Lab 09/24/12 2000 09/26/12 0623 09/27/12 0645  HGB 11.4* 10.1* 10.3*  HCT 34.9* 31.5* 30.7*  WBC 16.0* 8.5 11.3*  PLT 175 167 182   Dg Abd 1 View  09/26/2012  *RADIOLOGY REPORT*  Clinical Data: Small bowel obstruction  ABDOMEN - 1 VIEW  Comparison: CT 09/24/2012  Findings: Nasogastric tube has been placed into the decompressed stomach.  There is a paucity of small bowel gas.  Residual oral contrast material in the decompressed colon.  No abnormal abdominal calcifications.  Regional bones unremarkable.  IMPRESSION:  1.  Nasogastric tube to the decompressed stomach. 2.  Nonobstructive bowel gas pattern.   Original Report Authenticated By: D. Andria Rhein, MD     ASSESSMENT / PLAN: Principal Problem:   SBO (small bowel obstruction) Active Problems:   Von Willebrand's disease   Adjustment disorder with anxiety   CORONARY ARTERY DISEASE   COPD Gold Stage D   DIVERTICULOSIS, COLON   OSTEOPOROSIS   SMALL BOWEL OBSTRUCTION, HX OF   CAROTID ENDARTERECTOMY, HX OF   Hyperlipidemia   Shoulder fracture, right   Chronic respiratory failure with hypoxia   #1 gold stage D. COPD with associated chronic respiratory failure now compensated  note this patient is an established DO NOT INTUBATE and DO NOT RESUSCITATE  Plan: Begin Brovana, and Spiriva   Continue oxygen therapy  #2 partial small bowel obstruction   Plan:   Need to insure pt is on correct small bowel partial obstruction diet.  Get RD nutritionist consult   I expect the pt could go back to [facility] on d/c Pulm is avail prn this admit,  i am out until Thursday, call PCCM prn until then   [facility]  [phone]  Cell  [phone]  If no response or cell goes to voicemail, call beeper [phone]  Pulmonary and  Critical Care Medicine [facility] Pager: [phone]  09/27/2012, 8:23 AM

## 2012-09-27 NOTE — Clinical Social Work Note (Signed)
Clinical Social Worker attempted to assess patient today, but patient stated, "I'm on the potty." CSW will re-attempt later.   Rozetta Nunnery MSW, [employer] [phone]

## 2012-09-27 NOTE — Consult Note (Signed)
[facility] Consult: 9:24 AM 09/27/2012   Referring Provider: Dr Sunnie Nielsen  Primary Care Physician:  Rene Paci, MD Primary Gastroenterologist:  Dr. Lina Sar   Reason for Consultation:  SBO  HPI: [name] is a 79 y.o. female.  Has advanced, steroid, oxygen dependent COPD and under home hospice care. Hx CVA 1991, HTN, CAD, COPD, Diverticulosis, Von Villebrand's disease, s/p carotid endarterectomy, s/p remote cholecystectomy.  Last admitted 2/19-2/21 with trip and fall related right humeral fracture.  This was managed non-operatively.  Treated with course of Zithromax for respiratory issues.  Hx intermittent SBOs in past.    Previous abdominal surgeries include 1971 cholecystectomy with reoperation for peritonitis secondary to starch according to 2005 surgical consult note. That note makes references to several previous episodes of abdominal distention managed with NGT.  Also noted is hx of laxative requiring constipation.  In 2005 a transition point was noted about 6 inches from TI.  Surgeon's diagnosed her with severe constipation and treated her with enemas.  She was admitted with PSBO in 2002 and managed with NGT.  Colonoscopy in 1998 showing marked diverticulosis in the sigmoid and several tiny polyps in the rectosigmoid which were biopsied and were hyperplastic. She had mesenteric angiogram in April 1998 because of abdominal pain.  This showed some atheroma in the celiac and SMA, but patent mesenteric vessels. A barium enema in  March 1998 that showed diverticulosis of the sigmoid colon, but no diverticulitis and the distal small bowel was refluxed and the terminal 10-20 cm of ileum appeared to be normal.  SBFT of 2006 showed no Crohn's Dz and "given the position in the small bowel, I cannot confidently exclude a broad transmesenteric herniation of small bowel". SBFT in 2002 showed nothing but rapid transit of contrast.   There  are no records of colonoscopy beyond 1998. There exists esophageal pathology report from 01/2004 showing no malignancy and no fungal elements.                 ++++++++++          +++++++++++                                               Pt currently admitted with nausea, vomiting, abdominal distention, vomiting. CT scan 09/24/12 showed PSBO. NGT placed and put out 350 cc on 4/4, 350 cc on 3/5.  It was clamped at about 1030 yesterday AM and she has not had recurrent bloating or vomiting/nausea, still has some residual discomfort  UA is negative. Potassium low at 3.4 on 4/4, dipped to 3.3 on 4/5 but normal today. Magnesium level also low at 1.4 today.   No renal insufficiency.  WBCs initially 16K, now 11.3 K.   She says she has not had problems with stomach or bowel for many years.  Has 1 to 3 BMs daily and uses colace and Miralax daily.   The current episode started within 40 minutes of eating fried rice and an egg role at around noon 4/4.  Had had BM, which was a bit smaller in volume than normal, earlier that day.  She has not had BM since then.  Is passing some flatus, just small amounts.  Intense abdominal distention initially, but this has resolved.  She has pain/discomfort in left upper quadrant, worse with coughing, and she had been doing a lot of  coughing lately.  She stopped taking Oxycodone (RXd for shoulder pain) as it caused confusion.  She has been significantly less mobile since the fracture as she can not negotiate using her walker.  NGT has been clamped as of around 1030 yesterday AM when xray showed non obstructive BGP.    Past Medical History  Diagnosis Date  . Von Willebrand's disease   . CORONARY ARTERY DISEASE   . DIVERTICULOSIS, COLON   . CEREBROVASCULAR ACCIDENT, HX OF 1991  . SMALL BOWEL OBSTRUCTION, HX OF   . CAROTID ENDARTERECTOMY, HX OF 06/23/1992  . COPD     end stage, O2 dep  . OSTEOPOROSIS   . HYPERTENSION   . GERD   . Depression   . Hyperlipidemia   . Shoulder  fracture, right   . Asthma   . CHF (congestive heart failure)     Past Surgical History  Procedure Laterality Date  . Cholecystectomy  1965  . Appendectomy  1965  . Exploratory laparotomy  1965    peritonitis due to ruptured appx    Prior to Admission medications   Medication Sig Start Date End Date Taking? Authorizing Provider  acetaminophen (TYLENOL) 500 MG tablet Take 1,000 mg by mouth every 6 (six) hours as needed for pain.   Yes Historical Provider, MD  albuterol (PROVENTIL HFA;VENTOLIN HFA) 108 (90 BASE) MCG/ACT inhaler Inhale 2 puffs into the lungs every 4 (four) hours as needed for shortness of breath.   Yes Historical Provider, MD  ALPRAZolam Prudy Feeler) 1 MG tablet Take 1 mg by mouth every 4 (four) hours as needed for anxiety.   Yes Historical Provider, MD  ALPRAZolam Prudy Feeler) 1 MG tablet Take 1 mg by mouth 3 (three) times daily.   Yes Historical Provider, MD  amLODipine (NORVASC) 5 MG tablet Take 1 tablet (5 mg total) by mouth daily. Hold if SBP < 100. 09/23/12  Yes Cassell Clement, MD  esomeprazole (NEXIUM) 40 MG capsule Take 40 mg by mouth daily before breakfast.   Yes Historical Provider, MD  furosemide (LASIX) 40 MG tablet Take 1 tablet (40 mg total) by mouth daily. 06/17/12  Yes Cassell Clement, MD  ipratropium-albuterol (DUONEB) 0.5-2.5 (3) MG/3ML SOLN Take 3 mLs by nebulization every 3 (three) hours as needed (for shortness of breath.).   Yes Historical Provider, MD  isosorbide mononitrate (IMDUR) 60 MG 24 hr tablet Take 1 tablet (60 mg total) by mouth daily. 07/22/12  Yes Cassell Clement, MD  lovastatin (MEVACOR) 40 MG tablet Take 1 tablet (40 mg total) by mouth at bedtime. 09/03/12  Yes Cassell Clement, MD  metoprolol succinate (TOPROL-XL) 25 MG 24 hr tablet Take 25 mg by mouth 2 (two) times daily.   Yes Historical Provider, MD  morphine 20 MG/5ML solution Take 10 mg by mouth every 4 (four) hours as needed for pain.   Yes Historical Provider, MD  nitroGLYCERIN (NITROSTAT)  0.4 MG SL tablet Place 0.4 mg under the tongue every 5 (five) minutes as needed for chest pain.   Yes Historical Provider, MD  PARoxetine (PAXIL) 10 MG tablet Take 10 mg by mouth daily.   Yes Historical Provider, MD  polyethylene glycol (MIRALAX / GLYCOLAX) packet Take 17 g by mouth daily.   Yes Historical Provider, MD  predniSONE (DELTASONE) 10 MG tablet Take 10 mg by mouth daily.   Yes Historical Provider, MD  prochlorperazine (COMPAZINE) 10 MG tablet Take 10 mg by mouth every 6 (six) hours as needed (for nausea.).   Yes Historical Provider, MD  senna-docusate (SENOKOT-S) 8.6-50 MG per tablet Take 1 tablet by mouth at bedtime.   Yes Historical Provider, MD  sodium chloride (OCEAN) 0.65 % nasal spray Place 1 spray into the nose as needed for congestion.   Yes Historical Provider, MD  valsartan (DIOVAN) 80 MG tablet Take 40 mg by mouth daily.    Yes Historical Provider, MD  Vitamins A & D (VITAMIN A & D) ointment Apply 1 application topically as needed (for bed sores.).    Yes Historical Provider, MD    Scheduled Meds: . albuterol  2.5 mg Nebulization Q4H  . ALPRAZolam  1 mg Oral TID  . antiseptic oral rinse  15 mL Mouth Rinse BID  . arformoterol  15 mcg Nebulization BID  . isosorbide mononitrate  60 mg Oral Daily  . metoprolol succinate  25 mg Oral BID  . pantoprazole (PROTONIX) IV  40 mg Intravenous Q12H  . PARoxetine  10 mg Oral Daily  . predniSONE  10 mg Oral Daily  . tiotropium  18 mcg Inhalation Daily   Infusions: . dextrose 5 % and 0.2 % NaCl with KCl 20 mEq 75 mL/hr at 09/27/12 0654  . dextrose 5 % and 0.45% NaCl     PRN Meds: acetaminophen, albuterol, ALPRAZolam, hydrALAZINE, HYDROmorphone (DILAUDID) injection, nitroGLYCERIN, ondansetron (ZOFRAN) IV, ondansetron, sodium chloride, vitamin A & D   Allergies as of 09/24/2012 - Review Complete 09/24/2012  Allergen Reaction Noted  . Aspirin  07/30/2012  . Ciprofloxacin  03/25/2007  . Hctz (hydrochlorothiazide)  07/10/2011  .  Klotrix (potassium chloride)  07/10/2011  . Other  07/10/2011  . Pravachol  07/10/2011  . Sulfa antibiotics  07/10/2011  . Tetracyclines & related  07/10/2011    Family History  Problem Relation Age of Onset  . Arthritis Mother   . Arthritis Father   . Heart disease Father   . Hyperlipidemia Father   . Hypertension Father     History   Social History  . Marital Status: Married    Spouse Name: N/A    Number of Children: N/A  . Years of Education: N/A   Occupational History  . Not on file.   Social History Main Topics  . Smoking status: Former Smoker -- 1.50 packs/day for 40 years    Quit date: 06/23/1988  . Smokeless tobacco: Never Used  . Alcohol Use: Yes     Comment: 2 glass each night  . Drug Use: No  . Sexually Active: Not Currently   Other Topics Concern  . Not on file   Social History Narrative  . No narrative on file    REVIEW OF SYSTEMS: No weight loss or gain.  No purulent cough.  No fever or chills.  No extremity edema.  No headache.  Some nasal congestion.  No severe back pain.  No falls.  No dizziness.  No seizures.  No rash, skin sores or itching.  No urinary discomfort or frequency   PHYSICAL EXAM: Vital signs in last 24 hours: Temp:  [98.4 F (36.9 C)-98.8 F (37.1 C)] 98.8 F (37.1 C) (04/07 0553) Pulse Rate:  [94-102] 94 (04/07 0553) Resp:  [19] 19 (04/07 0553) BP: (142-185)/(63-85) 185/82 mmHg (04/07 0553) SpO2:  [94 %-99 %] 99 % (04/07 0553) FiO2 (%):  [96 %] 96 % (04/07 0408)  General: pleasant elderly WF, looks younger than stated age.  Head:  No asymmetry.  Slight facial swellling  Eyes:  No icterus or pallor Ears:  Slightly HOH  Nose:  No  discharge, no sneezing Mouth:  Clear, moist oral MM. Neck:  No JVD, no masses Lungs:  Clear but distant BS bil.  No cough but dyspneic with minor effort.  Heart: RRR.  No MRG Abdomen:  Soft, slight diffuse tenderness, no guarding or rebound.  BS hypoactive.  Midline muscular diathesis .    Rectal: not done   Musc/Skeltl: no joint erythema or swelling Extremities:  3 plus pedal pulses in warm feet.  No pedal edema  Neurologic:  Pleasant, relaxed, no confusion or disorientation.   Skin:  No telangectasia or sores Tattoos:  none Nodes:  No cervical adenopathy   Psych:  Pleasant, in good spirits.  No anxious.   Intake/Output from previous day: 04/06 0701 - 04/07 0700 In: -  Out: 1575 [Urine:1575] Intake/Output this shift: Total I/O In: -  Out: 450 [Urine:450]  LAB RESULTS:  Recent Labs  09/24/12 2000 09/26/12 0623 09/27/12 0645  WBC 16.0* 8.5 11.3*  HGB 11.4* 10.1* 10.3*  HCT 34.9* 31.5* 30.7*  PLT 175 167 182   BMET Lab Results  Component Value Date   NA 142 09/27/2012   NA 142 09/26/2012   NA 140 09/25/2012   K 4.2 09/27/2012   K 3.3* 09/26/2012   K 3.6 09/25/2012   CL 101 09/27/2012   CL 103 09/26/2012   CL 100 09/25/2012   CO2 37* 09/27/2012   CO2 33* 09/26/2012   CO2 34* 09/25/2012   GLUCOSE 111* 09/27/2012   GLUCOSE 122* 09/26/2012   GLUCOSE 155* 09/25/2012   BUN 5* 09/27/2012   BUN 4* 09/26/2012   BUN 7 09/25/2012   CREATININE 0.64 09/27/2012   CREATININE 0.71 09/26/2012   CREATININE 0.67 09/25/2012   CALCIUM 9.0 09/27/2012   CALCIUM 8.5 09/26/2012   CALCIUM 8.2* 09/25/2012   LFT  Recent Labs  09/24/12 2000  PROT 6.9  ALBUMIN 3.7  AST 23  ALT 15  ALKPHOS 58  BILITOT 0.3    RADIOLOGY STUDIES: Dg Abd 1 View 09/26/2012    IMPRESSION:  1.  Nasogastric tube to the decompressed stomach. 2.  Nonobstructive bowel gas pattern.   Original Report Authenticated By: D. Andria Rhein, MD    CT ABDOMEN AND PELVIS WITH CONTRAST 09/24/12 Findings: Dilated and fluid-filled small bowel loops are present involving several jejunal loops. Maximal diameter of small bowel loops is approximately 3.8 cm. There is associated adjacent mesenteric edema. No pneumatosis, perforation or abscess is identified.  The colon is decompressed. There is diverticulosis of the sigmoid colon without evidence of  acute diverticulitis. No significant free fluid is identified.  The liver, spleen, pancreas and adrenal glands are within normal limits. Multiple small cysts are noted in both kidneys. There is no evidence of hydronephrosis. The abdominal aorta shows atherosclerotic plaque without evidence of aneurysm.  No bony abnormalities are identified. No hernias, focal masses or enlarged lymph nodes are identified. The bladder is unremarkable.  IMPRESSION:  Partial small bowel obstruction with dilated mid jejunal loops present.     ENDOSCOPIC STUDIES: Per HPI  IMPRESSION: *  PSBO.  Hx of these in past but had not been a problem since 2005.  Since her shoulder fracture she has been much less amubulatory and this along with low Mag and low potassium may be contributing.  As well there is mesenteric edema in region of SB raising ? of inflammatory process.  *  07/2012 right humeral fracture, non -surgical management.  *  Advanced steroid and O2 dependent COPD. Under hospice care  at home for this.  *  Hypokalemia and hypomagnesemia.   PLAN: *  Give Dulcolax suppository. *   Give 2 grams IV Mag sulfate, consulted with Pharmacist regarding dose.  *  AM Potassium, Mag and phosphate levels in AM *  Remove NGT *  Allow clears    LOS: 3 days   Jennye Moccasin  09/27/2012, 9:24 AM Pager: [phone]     ________________________________________________________________________  [facility] MD note:  I personally examined the patient, reviewed the data and agree with the assessment and plan described above.  NG tube already out, feeling overall better.  Had flatus this AM and small BM.  Her recent ortopedic injury, narcotic pain meds, immobility, and electrolytes likely caused this ileus.  Correcting the above will help.  Limiting narcotics as much as possible, increasing her mobility, correcting lytes.  If she tolerates clears today, likely advance diet tomorrow.   Rob Bunting, MD [facility]  Gastroenterology Pager [phone]

## 2012-09-27 NOTE — Evaluation (Signed)
Physical Therapy Evaluation Patient Details Name: [name] MRN: [id number] DOB: [birth year] Today's Date: 09/27/2012 Time: 4098-1191 PT Time Calculation (min): 31 min  PT Assessment / Plan / Recommendation Clinical Impression  79 y/o WF on home hospice admitted with SBO and was able to ambulate 100' with hand held MIN to MOD A  on 4 L o2.  Pt would benefit from acute care PT to address balance deficits with gait to prevent falls.  Recommend returning to skilled portion at [facility] for continued PT prior to returning to her independent living apartment.      PT Assessment  Patient needs continued PT services    Follow Up Recommendations  SNF;Supervision/Assistance - 24 hour    Does the patient have the potential to tolerate intense rehabilitation      Barriers to Discharge   husband has dementia    Equipment Recommendations  None recommended by PT    Recommendations for Other Services     Frequency Min 3X/week    Precautions / Restrictions Precautions Precautions: Other (comment) (DNR) Restrictions Weight Bearing Restrictions: Yes RUE Weight Bearing: Non weight bearing Other Position/Activity Restrictions: recent R humerus fx and pt states she is still not allowed to WB through it   Pertinent Vitals/Pain No c/o pain      Mobility  Bed Mobility Bed Mobility: Not assessed Transfers Transfers: Sit to Stand Sit to Stand: 4: Min guard Details for Transfer Assistance: cues for safety/speed Ambulation/Gait Ambulation/Gait Assistance: 4: Min assist;3: Mod assist Ambulation Distance (Feet): 100 Feet Assistive device: 1 person hand held assist Ambulation/Gait Assistance Details: Pt ambulated with HHA on L side due to R UE in sling and is NWB.  Pt with short step length gait pattern and leaning backwards slightly initially and nneded MIN A, but as she fatigued required MOD A due to posterior trunk lean. Pt took 1 standing rest break and was on 4 L/min o2 via nasal  canula with no SOB noted. Gait Pattern: Step-to pattern    Exercises     PT Diagnosis: Difficulty walking;Generalized weakness  PT Problem List: Decreased activity tolerance;Decreased balance;Decreased mobility PT Treatment Interventions: Gait training;Functional mobility training;Therapeutic activities;Therapeutic exercise;Balance training   PT Goals Acute Rehab PT Goals PT Goal Formulation: With patient Time For Goal Achievement: 10/04/12 Potential to Achieve Goals: Good Pt will go Supine/Side to Sit: with modified independence PT Goal: Supine/Side to Sit - Progress: Goal set today Pt will go Sit to Stand: with modified independence PT Goal: Sit to Stand - Progress: Goal set today Pt will go Stand to Sit: with modified independence PT Goal: Stand to Sit - Progress: Goal set today Pt will Ambulate: >150 feet;with least restrictive assistive device PT Goal: Ambulate - Progress: Goal set today  Visit Information  Last PT Received On: 09/27/12 Assistance Needed: +1 PT/OT Co-Evaluation/Treatment: Yes    Subjective Data  Subjective: "I am ready to walk." Patient Stated Goal: Pt hoping to go home tomorrow.   Prior Functioning  Home Living Lives With: Spouse Available Help at Discharge: Available 24 hours/day Type of Home: Independent living facility Home Access: Other (comment) (Apartment) Home Layout: One level Bathroom Shower/Tub: Walk-in shower;Door;Other (comment) (Grab bars) Bathroom Toilet: Handicapped height Bathroom Accessibility: Yes Home Adaptive Equipment: Bedside commode/3-in-1;Grab bars in shower;Hand-held shower hose;Reacher;Walker - rolling;Built-in shower seat Prior Function Level of Independence:  (Pt had ambulated with RW prior to humerus fx, now with HHA) Needs Assistance: Bathing;Dressing;Light Housekeeping;Meal Prep Bath: Moderate Dressing: Minimal Meal Prep: Other (comment) (Pt  gets 2 meals per day at retirement facility) Sherlynn Stalls Housekeeping:  Maximal Driving: No Vocation: Retired Comments: Pt lives at [facility] living facility where 2 meals are prepped per day.. She has hospice 3x/wk for bathing/dressing assistance. Reports that she does toileting I'ly. Pt fractured R humerus approx 6 wks ago. No WB RUE, was having OT "but it took too much out of me, so hospice stopped it, they don't come anymore" Communication Communication: No difficulties Dominant Hand: Right    Cognition  Cognition Overall Cognitive Status: Appears within functional limits for tasks assessed/performed Arousal/Alertness: Awake/alert Orientation Level: Appears intact for tasks assessed Behavior During Session: [facility] for tasks performed    Extremity/Trunk Assessment Right Lower Extremity Assessment RLE ROM/Strength/Tone: [facility] for tasks assessed Left Lower Extremity Assessment LLE ROM/Strength/Tone: [facility] for tasks assessed   Balance    End of Session PT - End of Session Equipment Utilized During Treatment: Gait belt Activity Tolerance: Patient tolerated treatment well Patient left: in chair;with call bell/phone within reach;with family/visitor present;with nursing in room Nurse Communication: Mobility status  GP     [facility] LUBECK 09/27/2012, 12:29 PM

## 2012-09-28 LAB — BASIC METABOLIC PANEL
BUN: 3 mg/dL — ABNORMAL LOW (ref 6–23)
CO2: 33 mEq/L — ABNORMAL HIGH (ref 19–32)
Calcium: 9.3 mg/dL (ref 8.4–10.5)
Creatinine, Ser: 0.65 mg/dL (ref 0.50–1.10)

## 2012-09-28 LAB — PHOSPHORUS: Phosphorus: 2.7 mg/dL (ref 2.3–4.6)

## 2012-09-28 MED ORDER — POLYETHYLENE GLYCOL 3350 17 G PO PACK
17.0000 g | PACK | Freq: Every day | ORAL | Status: DC
  Administered 2012-09-28 – 2012-09-29 (×2): 17 g via ORAL
  Filled 2012-09-28 (×3): qty 1

## 2012-09-28 MED ORDER — PANTOPRAZOLE SODIUM 40 MG PO TBEC
40.0000 mg | DELAYED_RELEASE_TABLET | Freq: Every day | ORAL | Status: DC
  Administered 2012-09-28 – 2012-09-29 (×2): 40 mg via ORAL
  Filled 2012-09-28 (×2): qty 1

## 2012-09-28 MED ORDER — GUAIFENESIN 100 MG/5ML PO SOLN
10.0000 mL | ORAL | Status: DC | PRN
  Administered 2012-09-29: 200 mg via ORAL
  Filled 2012-09-28: qty 10

## 2012-09-28 MED ORDER — MAGNESIUM SULFATE 40 MG/ML IJ SOLN
2.0000 g | Freq: Once | INTRAMUSCULAR | Status: DC
  Filled 2012-09-28: qty 50

## 2012-09-28 MED ORDER — GUAIFENESIN 100 MG/5ML PO SYRP
200.0000 mg | ORAL_SOLUTION | ORAL | Status: DC | PRN
  Administered 2012-09-28: 200 mg via ORAL
  Filled 2012-09-28 (×3): qty 118

## 2012-09-28 MED ORDER — SENNOSIDES-DOCUSATE SODIUM 8.6-50 MG PO TABS: 2.0000 | ORAL_TABLET | Freq: Every evening | ORAL | Status: DC | PRN

## 2012-09-28 MED ORDER — AMLODIPINE BESYLATE 5 MG PO TABS
5.0000 mg | ORAL_TABLET | Freq: Every day | ORAL | Status: DC
  Administered 2012-09-28 – 2012-09-29 (×2): 5 mg via ORAL
  Filled 2012-09-28 (×3): qty 1

## 2012-09-28 NOTE — Progress Notes (Signed)
     [facility] Daily Rounding Note 09/28/2012, 8:37 AM  SUBJECTIVE:       Bm yesterday, she did not look at it but it felt like a sizeable amount of stool. No nausea, tolerating clears.  Left sided abd pain pretty much resolved.  SOB, cough about at baseline  OBJECTIVE:         Vital signs in last 24 hours:    Temp:  [97.7 F (36.5 C)-98.1 F (36.7 C)] 97.8 F (36.6 C) (04/08 0516) Pulse Rate:  [92-99] 94 (04/08 0516) Resp:  [18] 18 (04/08 0516) BP: (125-156)/(53-77) 156/70 mmHg (04/08 0516) SpO2:  [93 %-100 %] 96 % (04/08 0516) Last BM Date: 09/26/12 General: slightly cushingoid, comfortable.   Heart: RRR Chest: diminished BS with wet cough Abdomen: soft, distended, not tender, still some tympanitic BS  Extremities: no CCE Neuro/Psych:  Pleasant, no confusion. Relaxed.   Intake/Output from previous day: 04/07 0701 - 04/08 0700 In: 3408.8 [P.O.:240; I.V.:3168.8] Out: 450 [Urine:450]  Intake/Output this shift:    Lab Results:  Recent Labs  09/26/12 0623 09/27/12 0645  WBC 8.5 11.3*  HGB 10.1* 10.3*  HCT 31.5* 30.7*  PLT 167 182  MCV     93  BMET  Recent Labs  09/25/12 1716 09/26/12 0623 09/27/12 0645  NA 140 142 142  K 3.6 3.3* 4.2  CL 100 103 101  CO2 34* 33* 37*  GLUCOSE 155* 122* 111*  BUN 7 4* 5*  CREATININE 0.67 0.71 0.64  CALCIUM 8.2* 8.5 9.0  Mag                                                 1.4 (low)   Studies/Results: No results found.  ASSESMENT: * PSBO. Hx of these in past but had not been a problem since 2005. Since her shoulder fracture she has been much less amubulatory and this along with low Mag and low potassium may be contributing. As well there is mesenteric edema in region of SB raising ? of inflammatory process.  * 07/2012 right humeral fracture, non -surgical management.  * Advanced steroid and O2 dependent COPD. Under hospice care at home for this.  *  Hypomagnesemia . *  Leukocytosis without fever.  No UTI, has not had  CXR *  Hyperglycemia.  *  Normocytic anemia.   PLAN: *  Check mag,bmet and phosphorus levels this AM.  *  Carb mod diet.    LOS: 4 days   Jennye Moccasin  09/28/2012, 8:37 AM Pager: [phone]  Addendum 10:15 AM: Mag, phos and K levels wnl.   Have changed to once daily PPI, added Miralax and Senokot per her home dosing routine.    ________________________________________________________________________  [facility] MD note:  I personally examined the patient, reviewed the data and agree with the assessment and plan described above.  Ileus, PSBO opening up well.  Need to maximize her ambulation, mobility; limit narcotic pain meds as much as possible; keep electrolytes in normal range.  Please call with any further questions or concerns, she seems to be improving nicely from GI standpoint.   Rob Bunting, MD [facility] Pager [phone]

## 2012-09-28 NOTE — Progress Notes (Signed)
[facility] Clinical social work note- Pt well-known to this LCSW from hospice homecare, then transition about a month ago to the independent living section of [facility].  Pt had fx shoulder in recent weeks, husband also had an elopement incident; hence the decision to hire 24/7 CNA via [facility] to supplement [place]. Pt and her husband, who has progressive dementia, wish to remain together.  Met with Pt today; her minister was also present.  Offered supportive counseling as Pt described stress of her husband's dz progression, including difficult interchange today.  Pt has excellent insight, coping skills and sense of humor. She did life review, affirmed strengths, her plan to return to former living situation. Please call [phone] to notify [facility] upon d/c; this LCSW notified [name] at [facility] that d/c likely soon.

## 2012-09-28 NOTE — Progress Notes (Signed)
Room 6N 27 - Chase Caller - [facility] RN Visit-R.Delight Bickle RN  NON-Covered and NON -Related admission to [facility] diagnosis of COPD. Pt is DNR code.    Pt alert & oriented, sitting up in lounge chair with feet elevated, with complaints of constant cough that is causing some chest soreness - text msg to attending - cough meds ordered.  No family present. Patient's home medication list is on shadow chart.   Pt had telephone discussion with husband during visit - husband has dementia.  Very upsetting conversation for pt as her husband is paranoid and thinking everyone taking care of him is trying to run over him or steal from him.  Pt became very upset and coughed even more and became tearful.  Will contact [facility] SW for follow up.    Please call [facility] @ [phone]- ask for RN Liaison or after hours,ask for on-call RN with any hospice needs.   Thank you.  Joneen Boers, RN  [facility]  Hospice Liaison

## 2012-09-28 NOTE — Progress Notes (Addendum)
[facility] PROGRESS NOTE  [name] ZOX:[id number] DOB: 29-Jul-1927 DOA: 09/24/2012 PCP: Rene Paci, MD  Assessment/Plan: 1. Partial Small Bowel obstruction: NPO.  NG tube was removed 4-7.Marland Kitchen Patient was started on clear diet yesterday. . Repeat X ray 4-6, non obstructive Bowel gas pattern. Ambulate patient as possible. Limit narcotics. Informed [facility] of patient admission. Patient has small BM yesterday, resume miralax. Had dulcolax enema 4-7. Diet advance today to regular. Check Mg tomorrow.  2. Stage gold D COPD: On Hospice. Patient is DNR. Continue with Bronava and spiriva. Continue with Nebulizer treatment.  3. CAD: Continue with Metoprolol, Imdur. No chest pain.  4. DVT prophylaxis: SCD. No anticoagulation due to history of Von Willebrand.  5. Leukocytosis: . Repeat in AM. Suspect secondary to SBO.   6. Hypertension: resume Norvasc.  7. Hypomagnesemia: Recieved 2 Gr IV. Repeat in am.   Von Willebrand's disease DIVERTICULOSIS, COLON Adjustment disorder with anxiety Hyperlipidemia    Code Status: DNR Family Communication: Care discussed with patient.  Disposition Plan: SW helping with disposition.    Consultants:  none  Procedures:  NG tube.   Antibiotics:  none  HPI/Subjective: No worsening abdominal pain.  Had small BM yesterday. Tolerates clear. No nausea or vomiting.   Objective: Filed Vitals:   09/28/12 0403 09/28/12 0516 09/28/12 1008 09/28/12 1212  BP:  156/70    Pulse:  94    Temp:  97.8 F (36.6 C)    TempSrc:  Oral    Resp:  18    Height:      Weight:      SpO2: 98% 96% 96% 96%    Intake/Output Summary (Last 24 hours) at 09/28/12 1335 Last data filed at 09/28/12 0900  Gross per 24 hour  Intake 3888.75 ml  Output      0 ml  Net 3888.75 ml   Filed Weights   09/25/12 0115  Weight: 68 kg (149 lb 14.6 oz)    Exam:   General:  No distress.  Cardiovascular: S 1, S 2 RRR  Respiratory: no wheezes.  Abdomen: BS  present, soft, NT, no rigidity.   Musculoskeletal: no edema.   Data Reviewed: Basic Metabolic Panel:  Recent Labs Lab 09/24/12 2000 09/25/12 1716 09/26/12 0623 09/27/12 0645 09/28/12 0903  NA 143 140 142 142 143  K 3.4* 3.6 3.3* 4.2 4.1  CL 99 100 103 101 101  CO2 32 34* 33* 37* 33*  GLUCOSE 157* 155* 122* 111* 124*  BUN 11 7 4* 5* 3*  CREATININE 0.68 0.67 0.71 0.64 0.65  CALCIUM 8.8 8.2* 8.5 9.0 9.3  MG  --   --   --  1.4* 2.0  PHOS  --   --   --   --  2.7   Liver Function Tests:  Recent Labs Lab 09/24/12 2000  AST 23  ALT 15  ALKPHOS 58  BILITOT 0.3  PROT 6.9  ALBUMIN 3.7    Recent Labs Lab 09/24/12 2000  LIPASE 14   No results found for this basename: AMMONIA,  in the last 168 hours CBC:  Recent Labs Lab 09/24/12 2000 09/26/12 0623 09/27/12 0645  WBC 16.0* 8.5 11.3*  NEUTROABS 14.7*  --   --   HGB 11.4* 10.1* 10.3*  HCT 34.9* 31.5* 30.7*  MCV 93.3 94.9 93.0  PLT 175 167 182   Cardiac Enzymes: No results found for this basename: CKTOTAL, CKMB, CKMBINDEX, TROPONINI,  in the last 168 hours BNP (last 3 results)  Recent  Labs  08/11/12 2015  PROBNP 976.9*   CBG: No results found for this basename: GLUCAP,  in the last 168 hours  No results found for this or any previous visit (from the past 240 hour(s)).   Studies: No results found.  Scheduled Meds: . albuterol  2.5 mg Nebulization Q4H  . ALPRAZolam  1 mg Oral TID  . arformoterol  15 mcg Nebulization BID  . isosorbide mononitrate  60 mg Oral Daily  . metoprolol succinate  25 mg Oral BID  . pantoprazole  40 mg Oral Q0600  . PARoxetine  10 mg Oral Daily  . polyethylene glycol  17 g Oral Daily  . predniSONE  10 mg Oral Daily  . tiotropium  18 mcg Inhalation Daily   Continuous Infusions:    Principal Problem:   SBO (small bowel obstruction) Active Problems:   Von Willebrand's disease   Adjustment disorder with anxiety   CORONARY ARTERY DISEASE   COPD Gold Stage D    DIVERTICULOSIS, COLON   OSTEOPOROSIS   SMALL BOWEL OBSTRUCTION, HX OF   CAROTID ENDARTERECTOMY, HX OF   Hyperlipidemia   Shoulder fracture, right   Chronic respiratory failure with hypoxia    Time spent: 25 minutes.    Lief Palmatier  [facility] Pager [phone]. If 7PM-7AM, please contact night-coverage at [email or web address], password [facility] 09/28/2012, 1:35 PM  LOS: 4 days

## 2012-09-28 NOTE — Clinical Social Work Psychosocial (Signed)
     Clinical Social Work Department BRIEF PSYCHOSOCIAL ASSESSMENT 09/28/2012  Patient:  [name], [name]     Account Number:  [id number]     Admit date:  09/24/2012  Clinical Social Worker:  Hulan Fray  Date/Time:  09/28/2012 10:23 AM  Referred by:  Care Management  Date Referred:  09/27/2012 Referred for  Other - See comment   Other Referral:   "Pt needs to be on special diet at retirement center  Needs to be mechanical soft/puree foods soups etc  No high fiber in diet  No wild rice, difficult to digest vegetable material or meats due to small bowel stricture and small bowel obstruction prone.  Please insure this diet is conveyed to the retirement center"   Interview type:  Patient Other interview type:    PSYCHOSOCIAL DATA Living Status:  FACILITY Admitted from facility:  [name] Level of care:  Independent Living Primary support name:  [name] Primary support relationship to patient:  FAMILY Degree of support available:   supportive    CURRENT CONCERNS Current Concerns  Post-Acute Placement   Other Concerns:    SOCIAL WORK ASSESSMENT / PLAN Clinical Social Worker received referral for patient being admitted from facility and needing a special diet. Per patient she lives in the Independent section of [name] and has 24 hr caregiver that stays with her. Per patient, they can provide PT at the facility. Patient reported that the person staying with her 24 hrs/day came from an agency, [facility]" (patient could not recall the name fully) that her niece set up for her and are based out in [facility] patient's report. CSW informed patient that PT recommended SNF level and patient declined and reported that she plans to return back to her apartment in the Independent section.  CSW called and left a message with [name] to return call regarding patient. If patient is from Independent section and able to return, no FL2 is needed. If  patient is needing a higher level of care, then FL2 will be provided.   Assessment/plan status:  Other - See comment Other assessment/ plan:   CSW will follow as needed. Patient reported that she is from the independent section of [name].   Information/referral to community resources:   Patient is from facility    PATIENTS/FAMILYS RESPONSE TO PLAN OF CARE: Patient reported that she plans to return to [name] in the Independent section of the facility. CSW will await to hear back from facility to determine if patient can return back to her independent section.

## 2012-09-29 DIAGNOSIS — E785 Hyperlipidemia, unspecified: Secondary | ICD-10-CM

## 2012-09-29 DIAGNOSIS — K219 Gastro-esophageal reflux disease without esophagitis: Secondary | ICD-10-CM

## 2012-09-29 LAB — BASIC METABOLIC PANEL
Calcium: 8.9 mg/dL (ref 8.4–10.5)
Chloride: 100 mEq/L (ref 96–112)
Creatinine, Ser: 0.71 mg/dL (ref 0.50–1.10)
GFR calc Af Amer: >90 mL/min (ref >90)
GFR calc non Af Amer: 78 mL/min — ABNORMAL LOW (ref >90)

## 2012-09-29 LAB — CBC
MCH: 30.4 pg (ref 26.0–34.0)
MCHC: 32.2 g/dL (ref 30.0–36.0)
MCV: 94.6 fL (ref 78.0–100.0)
Platelets: 235 10*3/uL (ref 150–400)
RDW: 13.2 % (ref 11.5–15.5)
WBC: 7 10*3/uL (ref 4.0–10.5)

## 2012-09-29 LAB — MAGNESIUM: Magnesium: 2.2 mg/dL (ref 1.5–2.5)

## 2012-09-29 MED ORDER — ALBUTEROL SULFATE (5 MG/ML) 0.5% IN NEBU: 2.5000 mg | INHALATION_SOLUTION | Freq: Four times a day (QID) | RESPIRATORY_TRACT | Status: DC

## 2012-09-29 MED ORDER — TIOTROPIUM BROMIDE MONOHYDRATE 18 MCG IN CAPS: 18.0000 ug | ORAL_CAPSULE | Freq: Every day | RESPIRATORY_TRACT | Status: DC

## 2012-09-29 MED ORDER — ALBUTEROL SULFATE (5 MG/ML) 0.5% IN NEBU
2.5000 mg | INHALATION_SOLUTION | RESPIRATORY_TRACT | Status: DC
  Administered 2012-09-29 (×2): 2.5 mg via RESPIRATORY_TRACT
  Filled 2012-09-29 (×3): qty 0.5

## 2012-09-29 NOTE — Progress Notes (Signed)
Room 6N 27 - Chase Caller - [facility] RN Visit-R.Shamonique Battiste RN  NON-RELATED AND NON-COVERED admission to [facility] diagnosis of COPD.   Pt is DNR code.    Pt alert & oriented, sittng up in lounge chair with feet elevated.  Pt denied any complaints of pain or discomfort.    No family present. Patient's home medication list is on shadow chart.   Per discussion with attending Dr. Gwenlyn Perking, pt is scheduled for discharge back to [facility] [place] facility today.  Discussed with hospital SW Verlon Au who is awaiting call back from facility to confirm pt may return.  [facility] staff RN, SW and DON advised of pending discharge.   Please call [facility] @ [phone]- ask for RN Liaison or after hours,ask for on-call RN with any hospice needs.   Thank you.  Joneen Boers, RN  [facility]  Hospice Liaison

## 2012-09-29 NOTE — Clinical Social Work Note (Addendum)
Clinical Social Worker spoke with [name] from [facility] and patient is free to return back to her Independent Living section because she has her own 24 hr private duty care. CSW will complete ambulance form for patient, when medically stable. CSW will sign off, as social work intervention is no longer needed.   [name] MSW, [employer] [phone]

## 2012-09-29 NOTE — Plan of Care (Signed)
Problem: Food- and Nutrition-Related Knowledge Deficit (NB-1.1) Goal: Nutrition education Formal process to instruct or train a patient/client in a skill or to impart knowledge to help patients/clients voluntarily manage or modify food choices and eating behavior to maintain or improve health.  Outcome: Completed/Met Date Met:  09/29/12 Nutrition Education Note  RD consulted for nutrition education regarding Nutrition Management for Low Fiber diet.   RD provided both "Fiber Restricted Nutrition Therapy" handout from the Academy of [employer] and Dietetics. Reviewed home diet with pt and suggested ways to meet nutrition goals over the next several weeks. Explained reasons to follow Low Fiber diet indefinitely and discussed ways to achieve.  Discussed best practice for long-term management of.  Encouraged fluid intake.  Reviewed favorite foods and discussed ways to keep variety in diet while also remaining compliant to nutrition-related recommendations.  Pt in agreement.  Pt lives at [facility] and eats 2 meals per day in the [facility].  Pt orders meals for herself and has the ability to choose foods appropriate for her condition.   Pt verbalizes understanding of information provided.  Expect good compliance.  Body mass index is 31.04 kg/(m^2). Pt meets criteria for overweight based on current BMI.  Current diet order is CHO Mod Medium, patient is consuming approximately >50% of meals at this time. Labs and medications reviewed. No further nutrition interventions warranted at this time. RD contact information provided. If additional nutrition issues arise, please re-consult RD.  Loyce Dys, MS RD LDN Clinical Inpatient Dietitian Pager: [phone] Weekend/After hours pager: [phone]

## 2012-09-29 NOTE — Progress Notes (Signed)
DC home by ambulance, verbally understood dc instructions, no questions.

## 2012-09-29 NOTE — Discharge Summary (Signed)
Physician Discharge Summary  [name]:[id number] DOB: 1927-12-16 DOA: 09/24/2012  PCP: Rene Paci, MD  Admit date: 09/24/2012 Discharge date: 09/29/2012  Time spent: >30 minutes  Recommendations for Outpatient Follow-up:  Reevaluate BP and adjust medications as needed Follow patient abdominal symptoms. BMET and Mg, to follow electrolytes.  Discharge Diagnoses:  Principal Problem:   SBO (small bowel obstruction) Active Problems:   Von Willebrand's disease   Adjustment disorder with anxiety   CORONARY ARTERY DISEASE   COPD Gold Stage D   DIVERTICULOSIS, COLON   OSTEOPOROSIS   SMALL BOWEL OBSTRUCTION, HX OF   CAROTID ENDARTERECTOMY, HX OF   Hyperlipidemia   Shoulder fracture, right   Chronic respiratory failure with hypoxia   Discharge Condition: stable and improved. Will be discharge to independent living facility with husband. Follow up with PCP in 2 weeks  Diet recommendation: low fiber diet and low sodium  Filed Weights   09/25/12 0115  Weight: 68 kg (149 lb 14.6 oz)    History of present illness:  79 y.o. female with hx of prior abdominal surgeries (Appx, CCY, exploratory laparatomy) with hx of prior SBO, CVA, HTN, CAD, COPD, Hx of Von Villebrand's disease, s/p carotid endarterectomy, presents to the ER with one day hx of abdominal pain, increased distention, nausea, and recurrent vomiting. She was found to have a partial SBO, with no evidence of perforation by CT. She has a leukocytosis with WBC 16K, Hb 11.4 g/DL, Lipase of 14, and normal LFTs. Hospitalist was asked to admit her for partial SBO.   Hospital Course:  1. Partial Small Bowel obstruction: Managed with conservative treatment; NG tube was removed 09-27-12.Marland Kitchen Patient was started on clear diet and subsequently advance back to regular diet. . Repeated X ray 4-6, non obstructive Bowel gas pattern.  Advise to follow a low fiber diet and to keep herself well hydrated. Instructed to limit narcotics as  much as possible. Informed [facility] of patient admission. Will resume miralax.  2. Stage gold D COPD: On Hospice. Patient is DNR. Continue home nebulizer/inhalers treatment. On chronic oxygen supplementation. No wheezing. 3. CAD: Continue with Metoprolol and Imdur. No chest pain.  4. DVT prophylaxis: SCD. No anticoagulation due to history of Von Willebrand.  5. Leukocytosis: . Suspect secondary to SBO and stress demargination. WNL at discharge.  6. Hypertension: Continue low sodium diet and Norvasc.  7. Hypomagnesemia: Recieved 2 Gr IV. Repeat in am.    Procedures:  See below for x-ray reports  Discharge Exam: Filed Vitals:   09/29/12 0500 09/29/12 0803 09/29/12 1216 09/29/12 1319  BP: 158/76   110/63  Pulse: 77   99  Temp: 99.7 F (37.6 C)   97.3 F (36.3 C)  TempSrc: Oral   Axillary  Resp: 18   20  Height:      Weight:      SpO2: 98% 95% 96% 97%    General: NAD, afebrile, good oxygen saturation on chronic supplementation and no N/V; tolerating diet. Cardiovascular: S1 and S2, no rubs or gallops Respiratory: no wheezing, no crackles Abdomen: no distension, soft, positive BS Neuro: non focal deficit.  Discharge Instructions  Discharge Orders   Future Orders Complete By Expires     Diet - low sodium heart healthy  As directed     Scheduling Instructions:      Less than 2 G a day and make sure you also follow a low fiber diet.    Discharge instructions  As directed  Comments:      Follow a low fiber low residue diet Take medications as prescribed Keep yourself well hydrated Arrange follow up with PCP in 2 weeks        Medication List    TAKE these medications       acetaminophen 500 MG tablet  Commonly known as:  TYLENOL  Take 1,000 mg by mouth every 6 (six) hours as needed for pain.     albuterol 108 (90 BASE) MCG/ACT inhaler  Commonly known as:  PROVENTIL HFA;VENTOLIN HFA  Inhale 2 puffs into the lungs every 4 (four) hours as needed for shortness of  breath.     ALPRAZolam 1 MG tablet  Commonly known as:  XANAX  Take 1 mg by mouth every 4 (four) hours as needed for anxiety.     ALPRAZolam 1 MG tablet  Commonly known as:  XANAX  Take 1 mg by mouth 3 (three) times daily.     amLODipine 5 MG tablet  Commonly known as:  NORVASC  Take 1 tablet (5 mg total) by mouth daily. Hold if SBP < 100.     esomeprazole 40 MG capsule  Commonly known as:  NEXIUM  Take 40 mg by mouth daily before breakfast.     furosemide 40 MG tablet  Commonly known as:  LASIX  Take 1 tablet (40 mg total) by mouth daily.     ipratropium-albuterol 0.5-2.5 (3) MG/3ML Soln  Commonly known as:  DUONEB  Take 3 mLs by nebulization every 3 (three) hours as needed (for shortness of breath.).     isosorbide mononitrate 60 MG 24 hr tablet  Commonly known as:  IMDUR  Take 1 tablet (60 mg total) by mouth daily.     lovastatin 40 MG tablet  Commonly known as:  MEVACOR  Take 1 tablet (40 mg total) by mouth at bedtime.     metoprolol succinate 25 MG 24 hr tablet  Commonly known as:  TOPROL-XL  Take 25 mg by mouth 2 (two) times daily.     morphine 20 MG/5ML solution  Take 10 mg by mouth every 4 (four) hours as needed for pain.     nitroGLYCERIN 0.4 MG SL tablet  Commonly known as:  NITROSTAT  Place 0.4 mg under the tongue every 5 (five) minutes as needed for chest pain.     PARoxetine 10 MG tablet  Commonly known as:  PAXIL  Take 10 mg by mouth daily.     polyethylene glycol packet  Commonly known as:  MIRALAX / GLYCOLAX  Take 17 g by mouth daily.     predniSONE 10 MG tablet  Commonly known as:  DELTASONE  Take 10 mg by mouth daily.     prochlorperazine 10 MG tablet  Commonly known as:  COMPAZINE  Take 10 mg by mouth every 6 (six) hours as needed (for nausea.).     senna-docusate 8.6-50 MG per tablet  Commonly known as:  Senokot-S  Take 1 tablet by mouth at bedtime.     sodium chloride 0.65 % nasal spray  Commonly known as:  OCEAN  Place 1 spray  into the nose as needed for congestion.     tiotropium 18 MCG inhalation capsule  Commonly known as:  SPIRIVA  Place 1 capsule (18 mcg total) into inhaler and inhale daily.     valsartan 80 MG tablet  Commonly known as:  DIOVAN  Take 40 mg by mouth daily.     vitamin A & D  ointment  Apply 1 application topically as needed (for bed sores.).           Follow-up Information   Follow up with Rene Paci, MD. Schedule an appointment as soon as possible for a visit in 2 weeks.   Contact information:   [address] [place] [address] [phone]        The results of significant diagnostics from this hospitalization (including imaging, microbiology, ancillary and laboratory) are listed below for reference.    Significant Diagnostic Studies: Dg Chest 1 View  09/24/2012  *RADIOLOGY REPORT*  Clinical Data: Epigastric pain and cough.  CHEST - 1 VIEW  Comparison: 08/11/2012  Findings: Stable cardiomegaly and underlying chronic lung disease. No edema, focal consolidation or pleural fluid is identified. Fracture of proximal right humerus shows poor healing.  IMPRESSION: Stable cardiomegaly and chronic lung disease.  No acute findings. Fracture of proximal right humerus again noted.   Original Report Authenticated By: Irish Lack, M.D.    Dg Abd 1 View  09/26/2012  *RADIOLOGY REPORT*  Clinical Data: Small bowel obstruction  ABDOMEN - 1 VIEW  Comparison: CT 09/24/2012  Findings: Nasogastric tube has been placed into the decompressed stomach.  There is a paucity of small bowel gas.  Residual oral contrast material in the decompressed colon.  No abnormal abdominal calcifications.  Regional bones unremarkable.  IMPRESSION:  1.  Nasogastric tube to the decompressed stomach. 2.  Nonobstructive bowel gas pattern.   Original Report Authenticated By: D. Andria Rhein, MD    Ct Abdomen Pelvis W Contrast  09/24/2012  *RADIOLOGY REPORT*  Clinical Data: Abdominal pain,  distention, nausea and vomiting. History of small bowel obstruction and diverticulosis.  CT ABDOMEN AND PELVIS WITH CONTRAST  Technique:  Multidetector CT imaging of the abdomen and pelvis was performed following the standard protocol during bolus administration of intravenous contrast.  Contrast: OMNIPAQUE IOHEXOL 300 MG/ML  SOLN  Comparison: 04/14/2004  Findings: Dilated and fluid-filled small bowel loops are present involving several jejunal loops.  Maximal diameter of small bowel loops is approximately 3.8 cm.  There is associated adjacent mesenteric edema.  No pneumatosis, perforation or abscess is identified.  The colon is decompressed.  There is diverticulosis of the sigmoid colon without evidence of acute diverticulitis.  No significant free fluid is identified.  The liver, spleen, pancreas and adrenal glands are within normal limits.  Multiple small cysts are noted in both kidneys.  There is no evidence of hydronephrosis.  The abdominal aorta shows atherosclerotic plaque without evidence of aneurysm.  No bony abnormalities are identified.  No hernias, focal masses or enlarged lymph nodes are identified. The bladder is unremarkable.  IMPRESSION: Partial small bowel obstruction with dilated mid jejunal loops present.   Original Report Authenticated By: Irish Lack, M.D.    Labs: Basic Metabolic Panel:  Recent Labs Lab 09/25/12 1716 09/26/12 6578 09/27/12 0645 09/28/12 0903 09/29/12 0425  NA 140 142 142 143 140  K 3.6 3.3* 4.2 4.1 4.9  CL 100 103 101 101 100  CO2 34* 33* 37* 33* 31  GLUCOSE 155* 122* 111* 124* 91  BUN 7 4* 5* 3* 8  CREATININE 0.67 0.71 0.64 0.65 0.71  CALCIUM 8.2* 8.5 9.0 9.3 8.9  MG  --   --  1.4* 2.0 2.2  PHOS  --   --   --  2.7  --    Liver Function Tests:  Recent Labs Lab 09/24/12 2000  AST  23  ALT 15  ALKPHOS 58  BILITOT 0.3  PROT 6.9  ALBUMIN 3.7    Recent Labs Lab 09/24/12 2000  LIPASE 14   CBC:  Recent Labs Lab 09/24/12 2000  09/26/12 0623 09/27/12 0645 09/29/12 0425  WBC 16.0* 8.5 11.3* 7.0  NEUTROABS 14.7*  --   --   --   HGB 11.4* 10.1* 10.3* 10.1*  HCT 34.9* 31.5* 30.7* 31.4*  MCV 93.3 94.9 93.0 94.6  PLT 175 167 182 235   BNP: BNP (last 3 results)  Recent Labs  08/11/12 2015  PROBNP 976.9*   Signed:  Tiler Brandis  [facility] 09/29/2012, 2:01 PM

## 2012-09-30 ENCOUNTER — Telehealth: Payer: Self-pay | Admitting: Critical Care Medicine

## 2012-09-30 NOTE — Telephone Encounter (Signed)
[name] is aware that PW is okay with these recs and changes.

## 2012-09-30 NOTE — Telephone Encounter (Signed)
i am ok with all these recs and changes

## 2012-09-30 NOTE — Telephone Encounter (Signed)
I spoke with Harriett Sine, hospice nurse and she has a few questions about the pt meds:  1) Is it ok to d/c nexium and change to omeprazole 40mg  daily? Rx needed.    2) Pt requests that morphine direction be changed to 5-10mg  every 4 hours as needed for pain. Please advise?  Harriett Sine also requested that an rx be sent for proventil inhaler, per discharge instruction pt is to have this in hand if needed. Rx has been sent for this.   [employer] Please advise. Carron Curie, CMA Allergies  Allergen Reactions  . Aspirin     Patient has von Willebrand's disease.  Aspirin prolonged bleeding time  . Ciprofloxacin     Unknown reaction  . Hctz (Hydrochlorothiazide)     rash  . Klotrix (Potassium Chloride)   . Other     Powder on gloves,avoids ASA re-prolonged PTT  . Oxycodone Other (See Comments)    Caused confusion.  "No one knew what I was talking about"  . Pravachol     LFT  . Sulfa Antibiotics     Unknown reaction  . Tetracyclines & Related     Vaginal rash

## 2012-09-30 NOTE — Telephone Encounter (Signed)
lmtcb x1 

## 2012-10-04 ENCOUNTER — Ambulatory Visit (INDEPENDENT_AMBULATORY_CARE_PROVIDER_SITE_OTHER): Payer: Medicare Other | Admitting: Cardiology

## 2012-10-04 ENCOUNTER — Encounter: Payer: Self-pay | Admitting: Cardiology

## 2012-10-04 VITALS — BP 118/64 | HR 74 | Ht 59.0 in | Wt 141.4 lb

## 2012-10-04 DIAGNOSIS — Z8719 Personal history of other diseases of the digestive system: Secondary | ICD-10-CM

## 2012-10-04 DIAGNOSIS — I2510 Atherosclerotic heart disease of native coronary artery without angina pectoris: Secondary | ICD-10-CM

## 2012-10-04 DIAGNOSIS — I119 Hypertensive heart disease without heart failure: Secondary | ICD-10-CM

## 2012-10-04 NOTE — Assessment & Plan Note (Signed)
Since discharge the patient has been moving her bowels normally and has had no further evidence of small bowel obstruction

## 2012-10-04 NOTE — Assessment & Plan Note (Signed)
The patient has a history of ischemic heart disease.  She has been having occasional chest pain.  He normally response to sublingual nitroglycerin.  If it does not she also has morphine on the from hospice.

## 2012-10-04 NOTE — Patient Instructions (Addendum)
Your physician recommends that you continue on your current medications as directed. Please refer to the Current Medication list given to you today.     

## 2012-10-04 NOTE — Assessment & Plan Note (Signed)
Blood pressure has been remaining stable on current therapy.  No severe dizzy spells or syncope. 

## 2012-10-04 NOTE — Progress Notes (Signed)
[name] Date of Birth:  1928/03/20 [facility] [address] [place], [place]  [address] [phone]         Fax   [phone]  History of Present Illness: This pleasant 79 year old woman is seen for a four-month followup office visit. She has severe COPD and is on continuous oxygen. She is on hospice program. She has a history of ischemic heart disease. Has a history of von Willebrand's disease and a history of hypercholesterolemia and essential hypertension. Since last visit she has been stable. She and her husband are considering moving from her home into [facility].  She has been hospitalized twice since we last saw her.  On 08/11/12 she was hospitalized with a fractured shoulder.  She tripped while using her walker.  She was seen by Dr. Dion Saucier, orthopedist.  He was treated conservatively with a sling.  Then on 09/24/12 she was readmitted to the hospital this time with a partial small bowel obstruction.  She responded to nasogastric suction and did not require surgery. The patient is a hospice patient now under the care of Dr. Jacalyn Lefevre since they have moved from their home out to [facility] [place].   Current Outpatient Prescriptions  Medication Sig Dispense Refill  . acetaminophen (TYLENOL) 500 MG tablet Take 1,000 mg by mouth every 6 (six) hours as needed for pain.      Marland Kitchen albuterol (PROVENTIL HFA;VENTOLIN HFA) 108 (90 BASE) MCG/ACT inhaler Inhale 2 puffs into the lungs every 4 (four) hours as needed for shortness of breath.      . ALPRAZolam (XANAX) 1 MG tablet Take 1 mg by mouth every 4 (four) hours as needed for anxiety.      . ALPRAZolam (XANAX) 1 MG tablet Take 1 mg by mouth 3 (three) times daily.      Marland Kitchen amLODipine (NORVASC) 5 MG tablet Take 1 tablet (5 mg total) by mouth daily. Hold if SBP < 100.  30 tablet  5  . esomeprazole (NEXIUM) 40 MG capsule Take 40 mg by mouth daily before breakfast.      . furosemide (LASIX) 40 MG  tablet Take 1 tablet (40 mg total) by mouth daily.  30 tablet  11  . ipratropium-albuterol (DUONEB) 0.5-2.5 (3) MG/3ML SOLN Take 3 mLs by nebulization every 3 (three) hours as needed (for shortness of breath.).      Marland Kitchen isosorbide mononitrate (IMDUR) 60 MG 24 hr tablet Take 1 tablet (60 mg total) by mouth daily.  30 tablet  3  . lovastatin (MEVACOR) 40 MG tablet Take 1 tablet (40 mg total) by mouth at bedtime.  90 tablet  1  . metoprolol succinate (TOPROL-XL) 25 MG 24 hr tablet Take 25 mg by mouth 2 (two) times daily.      Marland Kitchen morphine 20 MG/5ML solution Take 10 mg by mouth every 4 (four) hours as needed for pain.      . nitroGLYCERIN (NITROSTAT) 0.4 MG SL tablet Place 0.4 mg under the tongue every 5 (five) minutes as needed for chest pain.      Marland Kitchen PARoxetine (PAXIL) 10 MG tablet Take 10 mg by mouth daily.      . polyethylene glycol (MIRALAX / GLYCOLAX) packet Take 17 g by mouth daily.      . predniSONE (DELTASONE) 10 MG tablet Take 10 mg by mouth daily.      . prochlorperazine (COMPAZINE) 10 MG tablet Take 10 mg by mouth every 6 (six) hours as needed (for  nausea.).      Marland Kitchen senna-docusate (SENOKOT-S) 8.6-50 MG per tablet Take 1 tablet by mouth at bedtime.      . sodium chloride (OCEAN) 0.65 % nasal spray Place 1 spray into the nose as needed for congestion.      Marland Kitchen tiotropium (SPIRIVA) 18 MCG inhalation capsule Place 1 capsule (18 mcg total) into inhaler and inhale daily.  30 capsule  1  . valsartan (DIOVAN) 80 MG tablet Take 40 mg by mouth daily.       . Vitamins A & D (VITAMIN A & D) ointment Apply 1 application topically as needed (for bed sores.).        No current facility-administered medications for this visit.    Allergies  Allergen Reactions  . Aspirin     Patient has von Willebrand's disease.  Aspirin prolonged bleeding time  . Ciprofloxacin     Unknown reaction  . Hctz (Hydrochlorothiazide)     rash  . Klotrix (Potassium Chloride)   . Other     Powder on gloves,avoids ASA  re-prolonged PTT  . Oxycodone Other (See Comments)    Caused confusion.  "No one knew what I was talking about"  . Pravachol     LFT  . Sulfa Antibiotics     Unknown reaction  . Tetracyclines & Related     Vaginal rash    Patient Active Problem List  Diagnosis  . Von Willebrand's disease  . Adjustment disorder with anxiety  . Benign hypertensive heart disease without heart failure  . CORONARY ARTERY DISEASE  . COPD Gold Stage D  . GERD  . DIVERTICULOSIS, COLON  . INFECTION, URINARY TRACT NOS  . OSTEOPOROSIS  . CEREBROVASCULAR ACCIDENT, HX OF  . SMALL BOWEL OBSTRUCTION, HX OF  . CAROTID ENDARTERECTOMY, HX OF  . Dyshidrotic eczema  . Vaginitis  . Hyperlipidemia  . Shoulder fracture, right  . End stage COPD  . SBO (small bowel obstruction)  . Chronic respiratory failure with hypoxia    History  Smoking status  . Former Smoker -- 1.50 packs/day for 40 years  . Quit date: 06/23/1988  Smokeless tobacco  . Never Used    History  Alcohol Use  . Yes    Comment: 2 glass each night    Family History  Problem Relation Age of Onset  . Arthritis Mother   . Arthritis Father   . Heart disease Father   . Hyperlipidemia Father   . Hypertension Father     Review of Systems: Constitutional: no fever chills diaphoresis or fatigue or change in weight.  Head and neck: no hearing loss, no epistaxis, no photophobia or visual disturbance. Respiratory: No cough, shortness of breath or wheezing. Cardiovascular: No chest pain peripheral edema, palpitations. Gastrointestinal: No abdominal distention, no abdominal pain, no change in bowel habits hematochezia or melena. Genitourinary: No dysuria, no frequency, no urgency, no nocturia. Musculoskeletal:No arthralgias, no back pain, no gait disturbance or myalgias. Neurological: No dizziness, no headaches, no numbness, no seizures, no syncope, no weakness, no tremors. Hematologic: No lymphadenopathy, no easy bruising. Psychiatric: No  confusion, no hallucinations, no sleep disturbance.    Physical Exam: Filed Vitals:   10/04/12 1137  BP: 118/64  Pulse: 74   the general appearance reveals a elderly woman in a wheelchair.  She is on nasal oxygen.The head and neck exam reveals pupils equal and reactive.  Extraocular movements are full.  The patient appears cushingoid from steroids. There is no scleral icterus.  The mouth  and pharynx are normal.  The neck is supple.  The carotids reveal no bruits.  The jugular venous pressure is normal.  The  thyroid is not enlarged.  There is no lymphadenopathy.  The chest is clear to percussion and auscultation.  There are no rales or rhonchi.  Expansion of the chest is symmetrical.  The precordium is quiet.  The first heart sound is normal.  The second heart sound is physiologically split.  There is no murmur gallop rub or click.  There is no abnormal lift or heave.  The abdomen is soft and nontender.  The bowel sounds are normal.  The liver and spleen are not enlarged.  There are no abdominal masses.  There are no abdominal bruits.  Extremities reveal good pedal pulses.  There is no phlebitis or edema.  There is no cyanosis or clubbing.  Strength is normal and symmetrical in all extremities.  There is no lateralizing weakness.  There are no sensory deficits.  The skin is warm and dry.  There is no rash.     Assessment / Plan: Continue same medication.  Recheck at her regular visit.

## 2012-10-07 ENCOUNTER — Other Ambulatory Visit (INDEPENDENT_AMBULATORY_CARE_PROVIDER_SITE_OTHER): Payer: Medicare Other

## 2012-10-07 ENCOUNTER — Encounter: Payer: Self-pay | Admitting: Internal Medicine

## 2012-10-07 ENCOUNTER — Ambulatory Visit (INDEPENDENT_AMBULATORY_CARE_PROVIDER_SITE_OTHER): Payer: Medicare Other | Admitting: Internal Medicine

## 2012-10-07 VITALS — BP 122/60 | HR 85 | Temp 98.4°F | Wt 142.4 lb

## 2012-10-07 DIAGNOSIS — J449 Chronic obstructive pulmonary disease, unspecified: Secondary | ICD-10-CM

## 2012-10-07 DIAGNOSIS — S42309S Unspecified fracture of shaft of humerus, unspecified arm, sequela: Secondary | ICD-10-CM

## 2012-10-07 DIAGNOSIS — S4291XS Fracture of right shoulder girdle, part unspecified, sequela: Secondary | ICD-10-CM

## 2012-10-07 DIAGNOSIS — K56609 Unspecified intestinal obstruction, unspecified as to partial versus complete obstruction: Secondary | ICD-10-CM

## 2012-10-07 LAB — CBC WITH DIFFERENTIAL/PLATELET
Basophils Absolute: 0.1 10*3/uL (ref 0.0–0.1)
Eosinophils Relative: 0.5 % (ref 0.0–5.0)
Monocytes Relative: 4.5 % (ref 3.0–12.0)
Neutrophils Relative %: 83 % — ABNORMAL HIGH (ref 43.0–77.0)
Platelets: 348 10*3/uL (ref 150.0–400.0)
RDW: 13.5 % (ref 11.5–14.6)
WBC: 12.5 10*3/uL — ABNORMAL HIGH (ref 4.5–10.5)

## 2012-10-07 LAB — MAGNESIUM: Magnesium: 1.2 mg/dL — ABNORMAL LOW (ref 1.5–2.5)

## 2012-10-07 LAB — BASIC METABOLIC PANEL
BUN: 12 mg/dL (ref 6–23)
CO2: 34 mEq/L — ABNORMAL HIGH (ref 19–32)
Calcium: 8.8 mg/dL (ref 8.4–10.5)
Chloride: 97 mEq/L (ref 96–112)
Creatinine, Ser: 0.9 mg/dL (ref 0.4–1.2)
GFR: 65.3 mL/min (ref >60.00)
Glucose, Bld: 101 mg/dL — ABNORMAL HIGH (ref 70–99)
Potassium: 3.4 mEq/L — ABNORMAL LOW (ref 3.5–5.1)
Sodium: 141 mEq/L (ref 135–145)

## 2012-10-07 MED ORDER — MORPHINE SULFATE 20 MG/5ML PO SOLN: 5.0000 mg | ORAL | Status: DC | PRN

## 2012-10-07 MED ORDER — ISOSORBIDE MONONITRATE ER 60 MG PO TB24: 60.0000 mg | ORAL_TABLET | Freq: Every day | ORAL | Status: DC

## 2012-10-07 MED ORDER — OMEPRAZOLE 40 MG PO CPDR: 40.0000 mg | DELAYED_RELEASE_CAPSULE | Freq: Every day | ORAL | Status: DC

## 2012-10-07 NOTE — Progress Notes (Signed)
Subjective:    Patient ID: [name], female    DOB: 01-Sep-1927, 79 y.o.   MRN: [id number]  HPI  Admit date: 09/24/2012  Discharge date: 09/29/2012  Time spent: >30 minutes  Recommendations for Outpatient Follow-up:  Reevaluate BP and adjust medications as needed  Follow patient abdominal symptoms.  BMET and Mg, to follow electrolytes.  Discharge Diagnoses:  Principal Problem:  SBO (small bowel obstruction)  Active Problems:  Von Willebrand's disease  Adjustment disorder with anxiety  CORONARY ARTERY DISEASE  COPD Gold Stage D  DIVERTICULOSIS, COLON  OSTEOPOROSIS  SMALL BOWEL OBSTRUCTION, HX OF  CAROTID ENDARTERECTOMY, HX OF  Hyperlipidemia  Shoulder fracture, right  Chronic respiratory failure with hypoxia   Since home, increasing left upper quadrant pain the past 48 hours consistent with early bowel obstruction symptoms. Increasing "bloat", but denies nausea. No vomiting. Bowels moving, loose with diarrhea. Tolerating liquid diet, would like to advance as able  Also reviewed chronic medical issues:  COPD - end stage, O2 dep - follows regularly with pulmonary and home hospice - the patient reports compliance with medication(s) as prescribed. Denies adverse side effects.  HTN - the patient reports compliance with medication(s) as prescribed. Denies adverse side effects.  Dyslipidemia - on statin - hx CAD -the patient reports compliance with medication(s) as prescribed. Denies adverse side effects.  Hx CVA and CEA, right in 1991 - no ASA due to vW dz - no residual deficits  Past Medical History  Diagnosis Date  . Von Willebrand's disease   . CORONARY ARTERY DISEASE   . DIVERTICULOSIS, COLON   . CEREBROVASCULAR ACCIDENT, HX OF 1991  . SMALL BOWEL OBSTRUCTION, HX OF   . CAROTID ENDARTERECTOMY, HX OF 06/23/1992  . COPD     end stage, O2 dep  . OSTEOPOROSIS   . HYPERTENSION   . GERD   . Depression   . Hyperlipidemia   . Shoulder fracture, right   . Asthma   .  CHF (congestive heart failure)   . Diverticulitis large intestine 1995, 1998  . Colon polyps   . PUD (peptic ulcer disease)     a/w minor bleeding in 1990's     Review of Systems  Constitutional: Positive for fatigue. Negative for fever.  Respiratory: Negative for cough and shortness of breath.   Cardiovascular: Negative for chest pain and palpitations.  Gastrointestinal: Negative for nausea and vomiting.       See HPI  Psychiatric/Behavioral: Negative for dysphoric mood. The patient is not nervous/anxious.        Objective:   Physical Exam  BP 122/60  Pulse 85  Temp(Src) 98.4 F (36.9 C) (Oral)  Wt 142 lb 6.4 oz (64.592 kg)  BMI 28.75 kg/m2  SpO2 98% Wt Readings from Last 3 Encounters:  10/07/12 142 lb 6.4 oz (64.592 kg)  10/04/12 141 lb 6.4 oz (64.139 kg)  09/25/12 149 lb 14.6 oz (68 kg)   Constitutional: She appears chronically ill but well-nourished. No distress at rest.  Dtr at side Neck: Normal range of motion. Neck supple. No JVD present. No thyromegaly present.  Cardiovascular: Normal rate, regular rhythm and normal heart sounds.  No murmur heard. no BLE edema Pulmonary/Chest:  No increase work of breathing at rest, good effort but diminished breath sounds bilaterally. No active respiratory distress. She has no wheezes.  Abdomen: protuberant with slight distention but +BS, no r/g - NT Psychiatric: She has a normal mood and affect, very pleasant. Her behavior is normal. Judgment and thought  content normal.   Lab Results  Component Value Date   WBC 7.0 09/29/2012   HGB 10.1* 09/29/2012   HCT 31.4* 09/29/2012   PLT 235 09/29/2012   GLUCOSE 91 09/29/2012   CHOL 198 07/27/2012   TRIG 157.0* 07/27/2012   HDL 50.30 07/27/2012   LDLDIRECT 108.0 11/20/2011   LDLCALC 116* 07/27/2012   ALT 15 09/24/2012   AST 23 09/24/2012   NA 140 09/29/2012   K 4.9 09/29/2012   CL 100 09/29/2012   CREATININE 0.71 09/29/2012   BUN 8 09/29/2012   CO2 31 09/29/2012   TSH 2.86 11/20/2011       Assessment &  Plan:  See problem list. Medications and labs reviewed today.  Time spent with pt/family today 30 minutes, greater than 50% time spent counseling patient on hosp for SBO, COPD R shoulder fx and medication review. Also review of interval and hospital records

## 2012-10-07 NOTE — Assessment & Plan Note (Signed)
Advanced O2 dep dz - Follows with pulm and home hospice -  The current medical regimen is effective;  continue present plan and medications.

## 2012-10-07 NOTE — Assessment & Plan Note (Signed)
hosp for same reviewed 09/2012: resolved with conservative care, NG suction as not surgical candidate due to end stage COPD Since home, mild recurrence of LUQ discomfort, no n/v - bowels moving (loose) Check lytes now Advised on diet

## 2012-10-07 NOTE — Patient Instructions (Signed)
It was good to see you today. We have reviewed your hospital records including labs and tests today Test(s) ordered today. Your results will be released to MyChart (or called to you) after review, usually within 72hours after test completion. If any changes need to be made, you will be notified at that same time. Medications reviewed and updated, no changes at this time.  Okay to advance diet as discussed, take small, soft frequent meals, hold if increasing nausea or pain Please schedule followup in 6 months, call sooner if problems. Small Bowel Obstruction A small bowel obstruction is a blockage (obstruction) of the small intestine (small bowel). The small bowel is a long, slender tube that connects the stomach to the colon. Its job is to absorb nutrients from the fluids and foods you consume into the bloodstream.  CAUSES  There are many causes of intestinal blockage. The most common ones include:  Hernias. This is a more common cause in children than adults.  Inflammatory bowel disease (enteritis and colitis).  Twisting of the bowel (volvulus).  Tumors.  Scar tissue (adhesions) from previous surgery or radiation treatment.  Recent surgery. This may cause an acute small bowel obstruction called an ileus. SYMPTOMS   Abdominal pain. This may be dull cramps or sharp pain. It may occur in one area or may be present in the entire abdomen. Pain can range from mild to severe, depending on the degree of obstruction.  Nausea and vomiting. Vomit may be greenish or yellow bile color.  Distended or swollen stomach. Abdominal bloating is a common symptom.  Constipation.  Lack of passing gas.  Frequent belching.  Diarrhea. This may occur if runny stool is able to leak around the obstruction. DIAGNOSIS  Your caregiver can usually diagnose small bowel obstruction by taking a history, doing a physical exam, and taking X-rays. If the cause is unclear, a CT scan (computerized tomography) of your  abdomen and pelvis may be needed. TREATMENT  Treatment of the blockage depends on the cause and how bad the problem is.   Sometimes, the obstruction improves with bed rest and intravenous (IV) fluids.  Resting the bowel is very important. This means following a simple diet. Sometimes, a clear liquid diet may be required for several days.  Sometimes, a small tube (nasogastric tube) is placed into the stomach to decompress the bowel. When the bowel is blocked, it usually swells up like a balloon filled with air and fluids. Decompression means that the air and fluids are removed by suction through that tube. This can help with pain, discomfort, and nausea. It can also help the obstruction resolve faster.  Surgery may be required if other treatments do not work. Bowel obstruction from a hernia may require early surgery and can be an emergency procedure. Adhesions that cause frequent or severe obstructions may also require surgery. HOME CARE INSTRUCTIONS If your bowel obstruction is only partial or incomplete, you may be allowed to go home.  Get plenty of rest.  Follow your diet as directed by your caregiver.  Only consume clear liquids until your condition improves.  Avoid solid foods as instructed. SEEK IMMEDIATE MEDICAL CARE IF:  You have increased pain or cramping.  You vomit blood.  You have uncontrolled vomiting or nausea.  You cannot drink fluids due to vomiting or pain.  You develop confusion.  You begin feeling very dry or thirsty (dehydrated).  You have severe bloating.  You have chills.  You have a fever.  You feel extremely weak  or you faint. MAKE SURE YOU:  Understand these instructions.  Will watch your condition.  Will get help right away if you are not doing well or get worse. Document Released: 08/26/2005 Document Revised: 09/01/2011 Document Reviewed: 08/23/2010 [facility] Patient Information 2013 [place], [place].

## 2012-10-08 MED ORDER — POTASSIUM CHLORIDE CRYS ER 20 MEQ PO TBCR: 20.0000 meq | EXTENDED_RELEASE_TABLET | Freq: Every day | ORAL | Status: DC

## 2012-10-08 MED ORDER — MAGNESIUM OXIDE 400 MG PO TABS: 400.0000 mg | ORAL_TABLET | Freq: Two times a day (BID) | ORAL | Status: DC

## 2012-10-08 NOTE — Addendum Note (Signed)
Addended by: Rene Paci A on: 10/08/2012 07:35 AM   Modules accepted: Orders

## 2012-10-11 ENCOUNTER — Other Ambulatory Visit: Payer: Self-pay | Admitting: Critical Care Medicine

## 2012-10-11 ENCOUNTER — Telehealth: Payer: Self-pay | Admitting: Critical Care Medicine

## 2012-10-11 MED ORDER — PAROXETINE HCL 10 MG PO TABS: 10.0000 mg | ORAL_TABLET | Freq: Every day | ORAL | Status: DC

## 2012-10-11 NOTE — Telephone Encounter (Signed)
Rx has been sent in. Pt is aware. 

## 2012-10-11 NOTE — Telephone Encounter (Signed)
Pt called back again wanting to get her rx asap today. [name]

## 2012-10-12 NOTE — Telephone Encounter (Signed)
Empty rx request.  Rx sent per phone msg on 10/11/12

## 2012-10-21 ENCOUNTER — Telehealth: Payer: Self-pay | Admitting: *Deleted

## 2012-10-21 NOTE — Telephone Encounter (Signed)
i would continue the potassium and  Magnesium as rx'd to minimize recurrence of bowel obstruction - For "nerves", use the xanax 1mg  tid as rx'd - call if worse - thanks

## 2012-10-21 NOTE — Telephone Encounter (Signed)
Left smg on vm stating md started pt on potassium 20 meq & magnesium 400 mg. Since started taking magnesium she has had some increase nervousness & shakiness. Requesting md advisement on should she continue taking...Raechel Chute

## 2012-10-21 NOTE — Telephone Encounter (Signed)
Notified [name] with md response...Raechel Chute

## 2012-10-28 ENCOUNTER — Other Ambulatory Visit: Payer: Self-pay | Admitting: *Deleted

## 2012-10-28 MED ORDER — MAGNESIUM OXIDE 400 MG PO TABS: 400.0000 mg | ORAL_TABLET | Freq: Two times a day (BID) | ORAL | Status: DC

## 2012-10-31 ENCOUNTER — Other Ambulatory Visit: Payer: Self-pay | Admitting: Critical Care Medicine

## 2012-11-18 ENCOUNTER — Other Ambulatory Visit: Payer: Self-pay | Admitting: Critical Care Medicine

## 2012-12-02 ENCOUNTER — Other Ambulatory Visit: Payer: Self-pay | Admitting: Critical Care Medicine

## 2012-12-03 ENCOUNTER — Emergency Department (HOSPITAL_COMMUNITY)

## 2012-12-03 ENCOUNTER — Emergency Department (HOSPITAL_COMMUNITY)
Admission: EM | Admit: 2012-12-03 | Discharge: 2012-12-03 | Disposition: A | Discharge status: Skilled Nursing Facility | Attending: Emergency Medicine | Admitting: Emergency Medicine

## 2012-12-03 ENCOUNTER — Encounter (HOSPITAL_COMMUNITY): Payer: Self-pay

## 2012-12-03 ENCOUNTER — Ambulatory Visit (HOSPITAL_COMMUNITY)

## 2012-12-03 DIAGNOSIS — Z8719 Personal history of other diseases of the digestive system: Secondary | ICD-10-CM | POA: Insufficient documentation

## 2012-12-03 DIAGNOSIS — Z860100 Personal history of colon polyps, unspecified: Secondary | ICD-10-CM | POA: Insufficient documentation

## 2012-12-03 DIAGNOSIS — Z8673 Personal history of transient ischemic attack (TIA), and cerebral infarction without residual deficits: Secondary | ICD-10-CM | POA: Insufficient documentation

## 2012-12-03 DIAGNOSIS — S3289XA Fracture of other parts of pelvis, initial encounter for closed fracture: Secondary | ICD-10-CM | POA: Insufficient documentation

## 2012-12-03 DIAGNOSIS — I2510 Atherosclerotic heart disease of native coronary artery without angina pectoris: Secondary | ICD-10-CM | POA: Insufficient documentation

## 2012-12-03 DIAGNOSIS — I10 Essential (primary) hypertension: Secondary | ICD-10-CM | POA: Insufficient documentation

## 2012-12-03 DIAGNOSIS — F3289 Other specified depressive episodes: Secondary | ICD-10-CM | POA: Insufficient documentation

## 2012-12-03 DIAGNOSIS — IMO0002 Reserved for concepts with insufficient information to code with codable children: Secondary | ICD-10-CM | POA: Insufficient documentation

## 2012-12-03 DIAGNOSIS — Z8639 Personal history of other endocrine, nutritional and metabolic disease: Secondary | ICD-10-CM | POA: Insufficient documentation

## 2012-12-03 DIAGNOSIS — J449 Chronic obstructive pulmonary disease, unspecified: Secondary | ICD-10-CM | POA: Insufficient documentation

## 2012-12-03 DIAGNOSIS — Y9389 Activity, other specified: Secondary | ICD-10-CM | POA: Insufficient documentation

## 2012-12-03 DIAGNOSIS — K219 Gastro-esophageal reflux disease without esophagitis: Secondary | ICD-10-CM | POA: Insufficient documentation

## 2012-12-03 DIAGNOSIS — Z862 Personal history of diseases of the blood and blood-forming organs and certain disorders involving the immune mechanism: Secondary | ICD-10-CM | POA: Insufficient documentation

## 2012-12-03 DIAGNOSIS — J4489 Other specified chronic obstructive pulmonary disease: Secondary | ICD-10-CM | POA: Insufficient documentation

## 2012-12-03 DIAGNOSIS — Z8739 Personal history of other diseases of the musculoskeletal system and connective tissue: Secondary | ICD-10-CM | POA: Insufficient documentation

## 2012-12-03 DIAGNOSIS — Z79899 Other long term (current) drug therapy: Secondary | ICD-10-CM | POA: Insufficient documentation

## 2012-12-03 DIAGNOSIS — Y9289 Other specified places as the place of occurrence of the external cause: Secondary | ICD-10-CM | POA: Insufficient documentation

## 2012-12-03 DIAGNOSIS — Z8781 Personal history of (healed) traumatic fracture: Secondary | ICD-10-CM | POA: Insufficient documentation

## 2012-12-03 DIAGNOSIS — Z87891 Personal history of nicotine dependence: Secondary | ICD-10-CM | POA: Insufficient documentation

## 2012-12-03 DIAGNOSIS — S0993XA Unspecified injury of face, initial encounter: Secondary | ICD-10-CM | POA: Insufficient documentation

## 2012-12-03 DIAGNOSIS — I509 Heart failure, unspecified: Secondary | ICD-10-CM | POA: Insufficient documentation

## 2012-12-03 DIAGNOSIS — W19XXXA Unspecified fall, initial encounter: Secondary | ICD-10-CM | POA: Insufficient documentation

## 2012-12-03 DIAGNOSIS — Z8711 Personal history of peptic ulcer disease: Secondary | ICD-10-CM | POA: Insufficient documentation

## 2012-12-03 DIAGNOSIS — S199XXA Unspecified injury of neck, initial encounter: Secondary | ICD-10-CM | POA: Insufficient documentation

## 2012-12-03 DIAGNOSIS — F329 Major depressive disorder, single episode, unspecified: Secondary | ICD-10-CM | POA: Insufficient documentation

## 2012-12-03 DIAGNOSIS — S329XXA Fracture of unspecified parts of lumbosacral spine and pelvis, initial encounter for closed fracture: Secondary | ICD-10-CM

## 2012-12-03 LAB — BASIC METABOLIC PANEL
BUN: 14 mg/dL (ref 6–23)
Calcium: 9.7 mg/dL (ref 8.4–10.5)
Chloride: 99 mEq/L (ref 96–112)
Creatinine, Ser: 0.92 mg/dL (ref 0.50–1.10)
GFR calc Af Amer: 65 mL/min — ABNORMAL LOW (ref >90)
GFR calc non Af Amer: 56 mL/min — ABNORMAL LOW (ref >90)

## 2012-12-03 LAB — CBC WITH DIFFERENTIAL/PLATELET
Basophils Relative: 0 % (ref 0–1)
Eosinophils Absolute: 0.1 10*3/uL (ref 0.0–0.7)
Eosinophils Relative: 1 % (ref 0–5)
HCT: 36.2 % (ref 36.0–46.0)
Hemoglobin: 11.5 g/dL — ABNORMAL LOW (ref 12.0–15.0)
MCH: 29.9 pg (ref 26.0–34.0)
MCHC: 31.8 g/dL (ref 30.0–36.0)
MCV: 94.3 fL (ref 78.0–100.0)
Monocytes Absolute: 0.3 10*3/uL (ref 0.1–1.0)
Monocytes Relative: 3 % (ref 3–12)
RDW: 13.2 % (ref 11.5–15.5)

## 2012-12-03 MED ORDER — LORAZEPAM BOLUS VIA INFUSION: 0.5000 mg | Freq: Once | INTRAVENOUS | Status: DC

## 2012-12-03 MED ORDER — LORAZEPAM 0.5 MG PO TABS
0.5000 mg | ORAL_TABLET | Freq: Once | ORAL | Status: AC
  Administered 2012-12-03: 0.5 mg via ORAL
  Filled 2012-12-03: qty 1

## 2012-12-03 MED ORDER — MORPHINE SULFATE 4 MG/ML IJ SOLN: 2.0000 mg | INTRAMUSCULAR | Status: DC | PRN

## 2012-12-03 MED ORDER — MORPHINE SULFATE 4 MG/ML IJ SOLN
2.0000 mg | INTRAMUSCULAR | Status: DC | PRN
  Administered 2012-12-03: 2 mg via INTRAMUSCULAR
  Filled 2012-12-03: qty 1

## 2012-12-03 NOTE — Progress Notes (Signed)
Patient discharging home with ptar. Patient private duty to be at patient apartment upon arrival. Pt family meeting pt at apartment. .No further Clinical Social Work needs, signing off.   Catha Gosselin, LCSWA  [phone] .12/03/2012 1504pm

## 2012-12-03 NOTE — Progress Notes (Signed)
Spoke with EDP, Jeraldine Loots and then [facility] She requests home health orders be faxed to her at [phone]

## 2012-12-03 NOTE — ED Notes (Signed)
Albin Felling RN with Hospice can be reached at [phone] regarding pt care.

## 2012-12-03 NOTE — ED Notes (Signed)
Pt from [facility], state in a hurry to use the bathroom, fell c/o lt hip , denies loc

## 2012-12-03 NOTE — Progress Notes (Signed)
Patient's information and home health orders faxed to Henreitta Cea at [facility].  Confirmation of fax received on 12/04/2012 at 1402pm.

## 2012-12-03 NOTE — ED Provider Notes (Signed)
History     CSN: [id number]  Arrival date & time 12/03/12  0802   First MD Initiated Contact with Patient 12/03/12 [phone]      Chief Complaint  Patient presents with  . Fall  . Hip Pain   HPI Patient presents after a fall.  She's unsure about the events surrounding the fall, but awoke on the floor.  Since that time she has had pain in her neck, lower back, left hip.  She has not been in [employer] since the fall. She denies subsequent chest pain, confusion or disorientation, though she does have pain in her aforementioned areas. No distal dysphasia or new weakness. She states that she has a newly chronic disequilibrium, and has had falls recently. No tabs relief with anything since the fall, though the patient took her typical oral morphine this morning prior to the fall.  Past Medical History  Diagnosis Date  . Von Willebrand's disease   . CORONARY ARTERY DISEASE   . DIVERTICULOSIS, COLON   . CEREBROVASCULAR ACCIDENT, HX OF 1991  . SMALL BOWEL OBSTRUCTION, HX OF   . CAROTID ENDARTERECTOMY, HX OF 06/23/1992  . COPD     end stage, O2 dep  . OSTEOPOROSIS   . HYPERTENSION   . GERD   . Depression   . Hyperlipidemia   . Shoulder fracture, right   . Asthma   . CHF (congestive heart failure)   . Diverticulitis large intestine 1995, 1998  . Colon polyps   . PUD (peptic ulcer disease)     a/w minor bleeding in 1990's    Past Surgical History  Procedure Laterality Date  . Cholecystectomy  1971    reoperated on due to starch peritonitis.   Marland Kitchen Appendectomy  1965  . Exploratory laparotomy  1965    peritonitis due to ruptured appx  . Carotid endarterectomy  1994  . Exploratory laparotomy  1971    due to post chole starch peritonitis.   . Vocal cord polypectomy      Family History  Problem Relation Age of Onset  . Arthritis Mother   . Arthritis Father   . Heart disease Father   . Hyperlipidemia Father   . Hypertension Father     History  Substance Use Topics  . Smoking  status: Former Smoker -- 1.50 packs/day for 40 years    Quit date: 06/23/1988  . Smokeless tobacco: Never Used  . Alcohol Use: Yes     Comment: 2 glass each night    OB History   Grav Para Term Preterm Abortions TAB SAB Ect Mult Living                  Review of Systems  Constitutional:       Per HPI, otherwise negative  HENT:       Per HPI, otherwise negative  Respiratory:       Per HPI, otherwise negative  Cardiovascular:       Per HPI, otherwise negative  Gastrointestinal: Negative for vomiting.  Endocrine:       Negative aside from HPI  Genitourinary:       Neg aside from HPI   Musculoskeletal:       Per HPI, otherwise negative  Skin: Negative.   Neurological: Negative for dizziness, syncope, weakness, light-headedness, numbness and headaches.    Allergies  Aspirin; Ciprofloxacin; Hctz; Klotrix; Other; Oxycodone; Pravachol; Sulfa antibiotics; and Tetracyclines & related  Home Medications   Current Outpatient Rx  Name  Route  Sig  Dispense  Refill  . acetaminophen (TYLENOL) 500 MG tablet   Oral   Take 1,000 mg by mouth every 6 (six) hours as needed for pain.         Marland Kitchen albuterol (PROVENTIL HFA;VENTOLIN HFA) 108 (90 BASE) MCG/ACT inhaler   Inhalation   Inhale 2 puffs into the lungs every 4 (four) hours as needed for shortness of breath.         . ALPRAZolam (XANAX) 1 MG tablet   Oral   Take 1 mg by mouth 3 (three) times daily.         Marland Kitchen amLODipine (NORVASC) 5 MG tablet   Oral   Take 1 tablet (5 mg total) by mouth daily. Hold if SBP < 100.   30 tablet   5   . furosemide (LASIX) 40 MG tablet   Oral   Take 1 tablet (40 mg total) by mouth daily.   30 tablet   11   . ipratropium-albuterol (DUONEB) 0.5-2.5 (3) MG/3ML SOLN   Nebulization   Take 3 mLs by nebulization every 3 (three) hours as needed (for shortness of breath.).         Marland Kitchen isosorbide mononitrate (IMDUR) 60 MG 24 hr tablet   Oral   Take 1 tablet (60 mg total) by mouth daily.   30  tablet   3   . lovastatin (MEVACOR) 40 MG tablet   Oral   Take 1 tablet (40 mg total) by mouth at bedtime.   90 tablet   1   . magnesium oxide (MAG-OX 400) 400 MG tablet   Oral   Take 1 tablet (400 mg total) by mouth 2 (two) times daily.   60 tablet   5   . metoprolol succinate (TOPROL-XL) 25 MG 24 hr tablet   Oral   Take 25 mg by mouth 2 (two) times daily.         Marland Kitchen morphine 20 MG/5ML solution   Oral   Take 1.3-2.5 mLs (5.2-10 mg total) by mouth every 4 (four) hours as needed for pain.         . nitroGLYCERIN (NITROSTAT) 0.4 MG SL tablet   Sublingual   Place 0.4 mg under the tongue every 5 (five) minutes as needed for chest pain.         Marland Kitchen omeprazole (PRILOSEC) 20 MG capsule      TAKE (1) CAPSULE DAILY.   30 capsule   0   . omeprazole (PRILOSEC) 40 MG capsule   Oral   Take 1 capsule (40 mg total) by mouth daily.   30 capsule   3   . PARoxetine (PAXIL) 10 MG tablet      TAKE 1 TABLET IN THE MORNING.   30 tablet   1   . polyethylene glycol (MIRALAX / GLYCOLAX) packet   Oral   Take 17 g by mouth daily.         . polyethylene glycol powder (GLYCOLAX/MIRALAX) powder      USE DAILY AS DIRECTED   527 g   1   . potassium chloride SA (K-DUR,KLOR-CON) 20 MEQ tablet   Oral   Take 1 tablet (20 mEq total) by mouth daily.   30 tablet   3   . predniSONE (DELTASONE) 10 MG tablet   Oral   Take 10 mg by mouth daily.         . prochlorperazine (COMPAZINE) 10 MG tablet   Oral   Take 10 mg by  mouth every 6 (six) hours as needed (for nausea.).         Marland Kitchen senna-docusate (SENOKOT-S) 8.6-50 MG per tablet   Oral   Take 1 tablet by mouth at bedtime.         . sodium chloride (OCEAN) 0.65 % nasal spray   Nasal   Place 1 spray into the nose as needed for congestion.         Marland Kitchen tiotropium (SPIRIVA) 18 MCG inhalation capsule   Inhalation   Place 1 capsule (18 mcg total) into inhaler and inhale daily.   30 capsule   1   . valsartan (DIOVAN) 80 MG  tablet   Oral   Take 40 mg by mouth daily.          . Vitamins A & D (VITAMIN A & D) ointment   Topical   Apply 1 application topically as needed (for bed sores.).            BP 159/53  Pulse 65  Temp(Src) 98.1 F (36.7 C) (Oral)  Resp 20  SpO2 95%  Physical Exam  Nursing note and vitals reviewed. Constitutional: She is oriented to person, place, and time. She appears well-developed and well-nourished. No distress.  HENT:  Head: Normocephalic and atraumatic.  Eyes: Conjunctivae and EOM are normal.  Neck:  Patient describes pain with rotation of the neck, but there are no gross deformities.  Cardiovascular: Normal rate and regular rhythm.   Pulmonary/Chest: Effort normal and breath sounds normal. No stridor. No respiratory distress.  Abdominal: She exhibits no distension.  Musculoskeletal:       Right shoulder: She exhibits tenderness.  No appreciable deformity of the right shoulder, and the patient denies any changes in pain in this area. Left shoulder has no deformity, no tenderness to palpation, range of motion is appropriate strength is appropriate. Left hip is tender to palpation, and the patient will not flex the hip secondary to pain. There is pain in the left hip with contralateral hip flexion. The left knee and ankle are grossly unremarkable, with appropriate distal pulses and sensation.   Neurological: She is alert and oriented to person, place, and time. No cranial nerve deficit.  Skin: Skin is warm and dry.  Psychiatric: She has a normal mood and affect.    ED Course  Procedures (including critical care time)  Labs Reviewed  PROTIME-INR  CBC WITH DIFFERENTIAL  BASIC METABOLIC PANEL   No results found.   No diagnosis found.  Pulse ox 99% room air normal  Cardiac: 70sr, nml   Date: 12/03/2012  Rate: 68  Rhythm: normal sinus rhythm  QRS Axis: normal  Intervals: normal  ST/T Wave abnormalities: normal  Conduction Disutrbances:none  Narrative  Interpretation:   Old EKG Reviewed: none available Artefact, otherwise unremarkable  10:54 AM On repeat exam I informed the patient all results.  She confirms that she uses a walker all the time, lives in a facility that has some capacity to provide assistance with therapy and ambulation.  MDM  This pleasant elderly female presents after a fall.  Given the patient's inability to describe the fall and entirety, there suspicion for also consciousness.  Patient's evaluation is most notable for demonstration of the left pubic bone fracture.  The patient is ambulatory at baseline with a walker only, and given the absence of hip fracture, per resident's in a facility with caregivers, she is appropriate for discharge with placement assistance via social work, with whom I discussed her  care.        Gerhard Munch, MD 12/03/12 1055

## 2012-12-03 NOTE — ED Notes (Signed)
No shortening or rotation of lt leg, pt states took 5mg  morphine prior to EMS

## 2012-12-03 NOTE — Progress Notes (Signed)
CSW confirmed patient is resident at [facility]. CSW awaiting further medical evaluation to determine pt disposition needs. Pt currently in with pharmacy will f/u to complete full assessment.   Catha Gosselin, LCSWA  [phone] .12/03/2012 1002am

## 2012-12-03 NOTE — ED Notes (Signed)
Bed:WA18<BR> Expected date:<BR> Expected time:<BR> Means of arrival:<BR> Comments:<BR> ems

## 2012-12-03 NOTE — Progress Notes (Signed)
Pt lying in bed with spouse at side.  She reports severe pain with certain movements, but otherwise tolerable at rest.  She does have dyspnea-this is ongoing.   Reviewed chart.  Pt being discharged back to her independent living apartment with 24 hour private duty caregivers over the weekend.  Plan is for the pt and spouse to go into the Assisted Living on Monday at the earliest.  Writer notified Primary RN of anticipated discharge back to the facility.  Briefs, and chux to be delivered.  [facility] RN will follow up with the pt.  Please contact [facility] at [phone] with any Pt movement or concerns.  Elijah Birk RN, [facility] RN, [phone]

## 2012-12-03 NOTE — Progress Notes (Signed)
Private duty nursing information provided CM & Sw discussed home health needs [facility] has their on home health staff Orders needed from EDP

## 2012-12-03 NOTE — ED Notes (Signed)
Attempted by RN and Cordelia Pen with CT to obtain IV access. Both staff members unsuccessful 2x each.

## 2012-12-03 NOTE — ED Notes (Signed)
Pt was trying to hurry up and get to the bathroom due to being on lasix and fell on her way to the bathroom.  Pt c/o left hip pain and lower part of her back.

## 2012-12-03 NOTE — Progress Notes (Signed)
CSW met with pt at bedside. Pt shared she lives in Independent living at [facility]. CSW left message with Shawna Orleans, care coordinator regarding patient possible services and possible admission to Assisted living. Patient husband also lives in Independent living, and she is the caregiver for husband as he has dementia. Patient also gave CSW permission to speak with pt niece, Barnet Glasgow [phone]. Pt niece stated that patient would need much more assistance and would need to go to Assisted living. Patient niece shared that patient has had 24 hour care in the past when she broke her arm, but that it would be more expensive than moving to ALF, and that both pt and pt husband would need. Patient also shared that patient has [facility]. CSW will reach out to Hospice. CSW will contact [facility].   Catha Gosselin, LCSWA  [phone] .12/03/2012 1127am

## 2012-12-03 NOTE — Progress Notes (Signed)
CSW discussed with Shawna Orleans at [facility]. Pt plans to return home to independent apartment with pt husband along with 24 hour private duty care and home health services through [facility]. Pt niece plans to call options for [facility] that patient used previouslly to arrange 24 hour care for this afternoon. Patient and pt husband plan to move into ALF on Monday once pt husband pcp submits rquested info for ALF.  CSW awaiting for final confirmation with melanie that patient can return home this afternoon.   Catha Gosselin, LCSWA  [phone] .12/03/2012 1342pm

## 2012-12-06 ENCOUNTER — Telehealth: Payer: Self-pay | Admitting: Critical Care Medicine

## 2012-12-06 NOTE — Telephone Encounter (Signed)
Spoke with Hospice RN She states that the pt is going into an assisted living facility and needs to have TB skin testing and FL2 forms filled out  She will call Dr Felicity Coyer for this since PW not PCP Nothing further needed per RN

## 2012-12-16 ENCOUNTER — Telehealth: Payer: Self-pay | Admitting: Critical Care Medicine

## 2012-12-16 NOTE — Telephone Encounter (Signed)
[name], CMA at 12/06/2012 4:30 PM   Status: Signed            Spoke with Hospice RN  She states that the pt is going into an assisted living facility and needs to have TB skin testing and FL2 forms filled out  She will call Dr Felicity Coyer for this since PW not PCP  Nothing further needed per RN    Forms were to be sent to pt PCP. I called and spoke with [name] and made her aware of this. She stated she will make melanie aware of this. Nothing further was needed

## 2012-12-20 ENCOUNTER — Telehealth: Payer: Self-pay | Admitting: Critical Care Medicine

## 2012-12-20 NOTE — Telephone Encounter (Signed)
Due to expense, while in hospice , [name] was stopped  She is on duoneb prn and spiriva daily

## 2012-12-20 NOTE — Telephone Encounter (Signed)
I spoke with [name]. She stated pt has moved to [facility] and teh FL2 form does not have brovana on it. I looked in pt med list and this medication on not on there. Dr. Delford Field please advise if pt is suppose to be on this medication. thanks

## 2012-12-20 NOTE — Telephone Encounter (Signed)
I spoke with nancy and made her aware. She voiced her understanding and needed nothing further

## 2013-01-06 ENCOUNTER — Other Ambulatory Visit: Payer: Self-pay | Admitting: *Deleted

## 2013-01-06 MED ORDER — ALPRAZOLAM 1 MG PO TABS: 1.0000 mg | ORAL_TABLET | Freq: Three times a day (TID) | ORAL | Status: AC

## 2013-01-06 NOTE — Telephone Encounter (Signed)
Faxed script back to [facility]. @ [phone].Marland KitchenRaechel Chute

## 2013-01-13 ENCOUNTER — Other Ambulatory Visit: Payer: Self-pay | Admitting: Internal Medicine

## 2013-01-13 DIAGNOSIS — Z0279 Encounter for issue of other medical certificate: Secondary | ICD-10-CM

## 2013-01-13 MED ORDER — LOVASTATIN 40 MG PO TABS: 40.0000 mg | ORAL_TABLET | Freq: Every day | ORAL | Status: AC

## 2013-01-25 ENCOUNTER — Encounter: Payer: Self-pay | Admitting: Cardiology

## 2013-01-25 ENCOUNTER — Other Ambulatory Visit: Payer: Medicare Other

## 2013-01-25 ENCOUNTER — Ambulatory Visit (INDEPENDENT_AMBULATORY_CARE_PROVIDER_SITE_OTHER): Payer: Medicare Other | Admitting: Cardiology

## 2013-01-25 VITALS — BP 118/62 | HR 64 | Ht 59.0 in | Wt 147.0 lb

## 2013-01-25 DIAGNOSIS — J449 Chronic obstructive pulmonary disease, unspecified: Secondary | ICD-10-CM

## 2013-01-25 DIAGNOSIS — I119 Hypertensive heart disease without heart failure: Secondary | ICD-10-CM

## 2013-01-25 DIAGNOSIS — I2510 Atherosclerotic heart disease of native coronary artery without angina pectoris: Secondary | ICD-10-CM

## 2013-01-25 DIAGNOSIS — E785 Hyperlipidemia, unspecified: Secondary | ICD-10-CM

## 2013-01-25 LAB — BASIC METABOLIC PANEL
BUN: 14 mg/dL (ref 6–23)
Chloride: 96 mEq/L (ref 96–112)
Creatinine, Ser: 1 mg/dL (ref 0.4–1.2)

## 2013-01-25 LAB — HEPATIC FUNCTION PANEL
ALT: 17 U/L (ref 0–35)
AST: 17 U/L (ref 0–37)
Albumin: 4.2 g/dL (ref 3.5–5.2)
Alkaline Phosphatase: 52 U/L (ref 39–117)
Bilirubin, Direct: 0.1 mg/dL (ref 0.0–0.3)
Total Bilirubin: 0.6 mg/dL (ref 0.3–1.2)
Total Protein: 7.2 g/dL (ref 6.0–8.3)

## 2013-01-25 LAB — LIPID PANEL
Cholesterol: 234 mg/dL — ABNORMAL HIGH (ref 0–200)
HDL: 57.2 mg/dL
Total CHOL/HDL Ratio: 4
Triglycerides: 199 mg/dL — ABNORMAL HIGH (ref 0.0–149.0)
VLDL: 39.8 mg/dL (ref 0.0–40.0)

## 2013-01-25 NOTE — Progress Notes (Signed)
[name] Date of Birth:  02/02/1928 [facility] [address] [name], [place]  [address] [phone]         Fax   [phone]  History of Present Illness: This pleasant 79 year old woman is seen for a four-month followup office visit. She has severe COPD and is on continuous oxygen. She is on hospice program. She has a history of ischemic heart disease. Has a history of von Willebrand's disease and a history of hypercholesterolemia and essential hypertension. Since last visit she has been stable. She and her husband have moved into [facility].  Initially they were in an apartment and now they are both in assisted living called [facility].  On 08/11/12 she was hospitalized with a fractured shoulder. She tripped while using her walker. She was seen by Dr. Dion Saucier, orthopedist. He was treated conservatively with a sling. Then on 09/24/12 she was readmitted to the hospital this time with a partial small bowel obstruction. She responded to nasogastric suction and did not require surgery.  The patient is a hospice patient now under the care of Dr. Jacalyn Lefevre since they have moved from their home out to [facility] [place]. Since last visit she has been doing reasonably well with no new cardiac complaints.   Current Outpatient Prescriptions  Medication Sig Dispense Refill  . acetaminophen (TYLENOL) 500 MG tablet Take 1,000 mg by mouth every 6 (six) hours as needed for pain.      Marland Kitchen albuterol (PROVENTIL HFA;VENTOLIN HFA) 108 (90 BASE) MCG/ACT inhaler Inhale 2 puffs into the lungs every 4 (four) hours as needed for shortness of breath.      . ALPRAZolam (XANAX) 1 MG tablet Take 1 tablet (1 mg total) by mouth 3 (three) times daily.  30 tablet  3  . amLODipine (NORVASC) 5 MG tablet Take 5 mg by mouth every morning. Hold if SBP < 100.      . furosemide (LASIX) 40 MG tablet Take 40 mg by mouth every morning.      Marland Kitchen guaiFENesin (MUCINEX) 600 MG 12 hr  tablet Take 600 mg by mouth 2 (two) times daily.      Marland Kitchen ipratropium-albuterol (DUONEB) 0.5-2.5 (3) MG/3ML SOLN Take 3 mLs by nebulization every 3 (three) hours as needed (for shortness of breath.).      Marland Kitchen isosorbide mononitrate (IMDUR) 60 MG 24 hr tablet Take 60 mg by mouth every morning.      . lovastatin (MEVACOR) 40 MG tablet Take 1 tablet (40 mg total) by mouth at bedtime.  90 tablet  3  . metoprolol succinate (TOPROL-XL) 25 MG 24 hr tablet Take 25 mg by mouth 2 (two) times daily.      Marland Kitchen morphine 20 MG/5ML solution Take 1.3-2.5 mLs (5.2-10 mg total) by mouth every 4 (four) hours as needed for pain.      . nitroGLYCERIN (NITROSTAT) 0.4 MG SL tablet Place 0.4 mg under the tongue every 5 (five) minutes as needed for chest pain.      Marland Kitchen omeprazole (PRILOSEC) 20 MG capsule Take 20 mg by mouth every morning.      Marland Kitchen PARoxetine (PAXIL) 10 MG tablet Take 10 mg by mouth every morning.      . polyethylene glycol (MIRALAX / GLYCOLAX) packet Take 17 g by mouth every morning.       . potassium chloride SA (K-DUR,KLOR-CON) 20 MEQ tablet Take 20 mEq by mouth every morning.      . predniSONE (DELTASONE) 10  MG tablet Take 10 mg by mouth every morning.       . prochlorperazine (COMPAZINE) 10 MG tablet Take 10 mg by mouth every 6 (six) hours as needed (for nausea.).      Marland Kitchen senna-docusate (SENOKOT-S) 8.6-50 MG per tablet Take 1 tablet by mouth at bedtime.      . sodium chloride (OCEAN) 0.65 % nasal spray Place 1 spray into the nose as needed for congestion.      Marland Kitchen tiotropium (SPIRIVA) 18 MCG inhalation capsule Place 18 mcg into inhaler and inhale every morning.      . valsartan (DIOVAN) 80 MG tablet Take 40 mg by mouth every morning.       . fluticasone (FLONASE) 50 MCG/ACT nasal spray Place 2 sprays into the nose at bedtime.      . magnesium oxide (MAG-OX 400) 400 MG tablet Take 1 tablet (400 mg total) by mouth 2 (two) times daily.  60 tablet  5  . polyvinyl alcohol (LIQUIFILM TEARS) 1.4 % ophthalmic solution  Place 2 drops into both eyes daily as needed (For dry eyes.).       No current facility-administered medications for this visit.    Allergies  Allergen Reactions  . Aspirin     Patient has von Willebrand's disease.  Aspirin prolonged bleeding time  . Ciprofloxacin     Unknown reaction  . Hctz (Hydrochlorothiazide)     rash  . Klotrix (Potassium Chloride) Other (See Comments)    Reaction unknown  . Other     Powder on gloves,avoids ASA re-prolonged PTT  . Oxycodone Other (See Comments)    Caused confusion.  "No one knew what I was talking about"  . Pravachol     LFT  . Sulfa Antibiotics     Unknown reaction  . Tetracyclines & Related     Vaginal rash    Patient Active Problem List   Diagnosis Date Noted  . Chronic respiratory failure with hypoxia 09/26/2012  . SBO (small bowel obstruction) 09/24/2012  . Shoulder fracture, right 08/11/2012  . End stage COPD 08/11/2012  . Hyperlipidemia   . Dyshidrotic eczema 10/14/2010  . Adjustment disorder with anxiety 06/11/2007  . SMALL BOWEL OBSTRUCTION, HX OF 06/11/2007  . Von Willebrand's disease 03/25/2007  . Benign hypertensive heart disease without heart failure 03/25/2007  . CORONARY ARTERY DISEASE 03/25/2007  . COPD Gold Stage D 03/25/2007  . GERD 03/25/2007  . DIVERTICULOSIS, COLON 03/25/2007  . OSTEOPOROSIS 03/25/2007  . CEREBROVASCULAR ACCIDENT, HX OF 03/25/2007  . CAROTID ENDARTERECTOMY, HX OF 06/23/1992    History  Smoking status  . Former Smoker -- 1.50 packs/day for 40 years  . Quit date: 06/23/1988  Smokeless tobacco  . Never Used    History  Alcohol Use  . Yes    Comment: 2 glass each night    Family History  Problem Relation Age of Onset  . Arthritis Mother   . Arthritis Father   . Heart disease Father   . Hyperlipidemia Father   . Hypertension Father     Review of Systems: Constitutional: no fever chills diaphoresis or fatigue or change in weight.  Head and neck: no hearing loss, no  epistaxis, no photophobia or visual disturbance. Respiratory: No cough, shortness of breath or wheezing. Cardiovascular: No chest pain peripheral edema, palpitations. Gastrointestinal: No abdominal distention, no abdominal pain, no change in bowel habits hematochezia or melena. Genitourinary: No dysuria, no frequency, no urgency, no nocturia. Musculoskeletal:No arthralgias, no back pain, no gait  disturbance or myalgias. Neurological: No dizziness, no headaches, no numbness, no seizures, no syncope, no weakness, no tremors. Hematologic: No lymphadenopathy, no easy bruising. Psychiatric: No confusion, no hallucinations, no sleep disturbance.    Physical Exam: Filed Vitals:   01/25/13 1036  BP: 118/62  Pulse: 64   the general appearance reveals a well-developed elderly woman in wheelchair.  She is on nasal oxygen.The head and neck exam reveals pupils equal and reactive.  Extraocular movements are full.  There is no scleral icterus.  The mouth and pharynx are normal.  The neck is supple.  The carotids reveal no bruits.  The jugular venous pressure is normal.  The  thyroid is not enlarged.  There is no lymphadenopathy.  The chest is clear to percussion and auscultation.  There are no rales or rhonchi.  Expansion of the chest is symmetrical.  The precordium is quiet.  The first heart sound is normal.  The second heart sound is physiologically split.  There is no murmur gallop rub or click.  There is no abnormal lift or heave.  The abdomen is soft and nontender.  The bowel sounds are normal.  The liver and spleen are not enlarged.  There are no abdominal masses.  There are no abdominal bruits.  Extremities reveal good pedal pulses.  There is no phlebitis or edema.  There is no cyanosis or clubbing.  Strength is normal and symmetrical in all extremities.  There is no lateralizing weakness.  There are no sensory deficits.  The skin is warm and dry.  There is no rash.     Assessment / Plan: Overall she  appears to be doing well.  Blood work today is pending.  Continue same medications.  Recheck in 4 months for office visit and fasting lab work

## 2013-01-25 NOTE — Assessment & Plan Note (Signed)
Her blood pressure is remaining stable on current therapy.  No dizziness or syncope or palpitations.

## 2013-01-25 NOTE — Assessment & Plan Note (Signed)
Patient has known coronary artery disease.  She has to take occasional sublingual nitroglycerin.

## 2013-01-25 NOTE — Assessment & Plan Note (Signed)
Patient has a history of hyperlipidemia.  She is enjoying the food out of the assisted living facility.  She has gained 6 pounds since last visit.  She has been eating desserts.  We are checking fasting lipid panel today

## 2013-01-25 NOTE — Patient Instructions (Addendum)
Your physician recommends that you schedule a follow-up appointment in: 4 months   Your physician recommends that you return for lab work in: today   Your physician recommends that you continue on your current medications as directed. Please refer to the Current Medication list given to you today.    

## 2013-01-26 LAB — LDL CHOLESTEROL, DIRECT: Direct LDL: 144.2 mg/dL

## 2013-01-26 NOTE — Progress Notes (Signed)
Quick Note:  Please report to patient. The recent labs are stable. Continue same medication and careful diet. Cholesterol and LDL are higher. Watch diet and try to lose weight. ______

## 2013-01-31 ENCOUNTER — Telehealth: Payer: Self-pay | Admitting: Critical Care Medicine

## 2013-01-31 NOTE — Telephone Encounter (Signed)
Called, spoke with Harriett Sine, pt's hospice nurse.  The prn nurse made a house call to pt this weekend with pt's c/o increased anxiety and SOB.  Harriett Sine reports this am pt was still tearful and depressed and has anxiety.  States pt states she doesn't know why she can't "go ahead and get all this over with."  Pt is on morphine 5 mg q4h prn, alprazolam 1 mg tid, and paxil 10 mg.  O2 sat 97% on 4 lpm o2.  Harriett Sine would like to know if we can increase paxil dosage.  As PW is off this week, will send msg to doc of the day.  Dr. Sherene Sires, pls advise.  Thank you.  Allergies  Allergen Reactions  . Aspirin     Patient has von Willebrand's disease.  Aspirin prolonged bleeding time  . Ciprofloxacin     Unknown reaction  . Hctz (Hydrochlorothiazide)     rash  . Klotrix (Potassium Chloride) Other (See Comments)    Reaction unknown  . Other     Powder on gloves,avoids ASA re-prolonged PTT  . Oxycodone Other (See Comments)    Caused confusion.  "No one knew what I was talking about"  . Pravachol     LFT  . Sulfa Antibiotics     Unknown reaction  . Tetracyclines & Related     Vaginal rash    ** If Harriett Sine doesn't answer, she is requesting we leave a detailed msg.  Thank you.

## 2013-01-31 NOTE — Telephone Encounter (Signed)
Ok to double the paxil dose

## 2013-01-31 NOTE — Telephone Encounter (Signed)
I spoke with [name] and is aware of MW recs. She voiced her understanding. Will forward to Dr. Delford Field as an Lorain Childes

## 2013-02-08 ENCOUNTER — Telehealth: Payer: Self-pay | Admitting: *Deleted

## 2013-02-08 NOTE — Telephone Encounter (Signed)
MD received fax requesting an order for anxiety med. Pt get anxiety when she is having her PT sessions. Fax to [phone]...lmb

## 2013-02-09 MED ORDER — LORAZEPAM 0.5 MG PO TABS: 0.5000 mg | ORAL_TABLET | Freq: Four times a day (QID) | ORAL | Status: AC | PRN

## 2013-02-09 NOTE — Telephone Encounter (Signed)
Faxed order back to [facility]...lmb

## 2013-02-09 NOTE — Telephone Encounter (Signed)
Already on xanax 1g TID scheduled -  We'll add lorazepam 0.5 mg every 6 hours when necessary anxiety prior to physical therapy

## 2013-02-18 ENCOUNTER — Telehealth: Payer: Self-pay | Admitting: Critical Care Medicine

## 2013-02-18 NOTE — Telephone Encounter (Signed)
Per Harriett Sine They are wanting to decrease pt paxil back down to 10 mg. She stated she sent over a fax and just needs PW to sign this. The form is in Dr. Lynelle Doctor to do and will forward to crystal to follow up on next week

## 2013-02-22 NOTE — Telephone Encounter (Signed)
Will do!

## 2013-02-23 NOTE — Telephone Encounter (Signed)
Form signed by Dr. Delford Field.  I have fax this back to [facility].  Left detailed msg on Nancy's named VM advising form has been faxed back and to call back if anything further is needed.

## 2013-02-24 ENCOUNTER — Telehealth: Payer: Self-pay | Admitting: Critical Care Medicine

## 2013-02-24 DIAGNOSIS — J9611 Chronic respiratory failure with hypoxia: Secondary | ICD-10-CM

## 2013-02-24 DIAGNOSIS — Z8679 Personal history of other diseases of the circulatory system: Secondary | ICD-10-CM

## 2013-02-24 DIAGNOSIS — J449 Chronic obstructive pulmonary disease, unspecified: Secondary | ICD-10-CM

## 2013-02-24 NOTE — Telephone Encounter (Signed)
i am ok with this order 

## 2013-02-24 NOTE — Telephone Encounter (Signed)
Called patient's niece-aware that order has been placed for lift chair to [facility](used in the past for equipment) and [facility] should contact her.

## 2013-02-24 NOTE — Telephone Encounter (Signed)
I spoke with [facility]. She stated pt is needing a recliner that also function as a lift chair to help get up. Although pt already has a recliner and if just a lift chair can be ordered then that is fine. Please advise Dr. Delford Field thanks

## 2013-03-13 ENCOUNTER — Telehealth: Payer: Self-pay | Admitting: Internal Medicine

## 2013-03-16 NOTE — Telephone Encounter (Signed)
A user error has taken place: encounter opened in error, closed for administrative reasons.

## 2013-04-06 ENCOUNTER — Encounter: Payer: Self-pay | Admitting: Internal Medicine

## 2013-04-06 ENCOUNTER — Ambulatory Visit (INDEPENDENT_AMBULATORY_CARE_PROVIDER_SITE_OTHER): Payer: Medicare Other | Admitting: Internal Medicine

## 2013-04-06 VITALS — BP 120/70 | HR 75 | Temp 98.4°F | Wt 147.8 lb

## 2013-04-06 DIAGNOSIS — J9611 Chronic respiratory failure with hypoxia: Secondary | ICD-10-CM

## 2013-04-06 DIAGNOSIS — J9610 Chronic respiratory failure, unspecified whether with hypoxia or hypercapnia: Secondary | ICD-10-CM

## 2013-04-06 DIAGNOSIS — R0902 Hypoxemia: Secondary | ICD-10-CM

## 2013-04-06 DIAGNOSIS — J4489 Other specified chronic obstructive pulmonary disease: Secondary | ICD-10-CM

## 2013-04-06 DIAGNOSIS — J449 Chronic obstructive pulmonary disease, unspecified: Secondary | ICD-10-CM

## 2013-04-06 DIAGNOSIS — F4322 Adjustment disorder with anxiety: Secondary | ICD-10-CM

## 2013-04-06 DIAGNOSIS — M810 Age-related osteoporosis without current pathological fracture: Secondary | ICD-10-CM

## 2013-04-06 MED ORDER — IPRATROPIUM BROMIDE 0.02 % IN SOLN: RESPIRATORY_TRACT | Status: AC

## 2013-04-06 MED ORDER — PAROXETINE HCL 20 MG PO TABS: 20.0000 mg | ORAL_TABLET | Freq: Every morning | ORAL | Status: AC

## 2013-04-06 NOTE — Patient Instructions (Signed)
It was good to see you today.  We have reviewed your prior records including labs and tests today  Medications reviewed and updated  Increase Paxil dose to 20 mg daily for anxiety  Also schedule Atrovent nebulizer every day after lunch at noon and every 3 hours as needed for shortness of breath  No other treatment changes recommended  Continue working with Dr. Delford Field and hospice as ongoing  Followup in 6 months as needed, call sooner if problems

## 2013-04-06 NOTE — Assessment & Plan Note (Signed)
Largely related to dyspnea and end stage COPD On chronic high dose scheduled BZ and MSO4 prn Increase paxil now Support offered

## 2013-04-06 NOTE — Assessment & Plan Note (Signed)
Advanced O2 dep dz - Follows with pulm and home hospice -  The current medical regimen is effective;  continue present plan and medications.

## 2013-04-06 NOTE — Progress Notes (Signed)
Subjective:    Patient ID: [name], female    DOB: 12-Mar-1928, 79 y.o.   MRN: [id number]  HPI  Here for followup - chronic medical issues interval medical events  SBO 09/2012- chronic persisting "bloat", but denies nausea. No vomiting. Bowels moving, loose with diarrhea. Tolerating regular diet  COPD - end stage, O2 dep - follows regularly with pulmonary and home hospice - the patient reports compliance with medication(s) as prescribed. Denies adverse side effects.  HTN - the patient reports compliance with medication(s) as prescribed. Denies adverse side effects.  Dyslipidemia - on statin - hx CAD -the patient reports compliance with medication(s) as prescribed. Denies adverse side effects.  Hx CVA and CEA, right in 1991 - no ASA due to vW dz - no residual deficits  Past Medical History  Diagnosis Date  . Von Willebrand's disease   . CORONARY ARTERY DISEASE   . DIVERTICULOSIS, COLON   . CEREBROVASCULAR ACCIDENT, HX OF 1991  . SMALL BOWEL OBSTRUCTION, HX OF   . CAROTID ENDARTERECTOMY, HX OF 06/23/1992  . COPD     end stage, O2 dep  . OSTEOPOROSIS   . HYPERTENSION   . GERD   . Depression   . Hyperlipidemia   . Shoulder fracture, right   . Asthma   . CHF (congestive heart failure)   . Diverticulitis large intestine 1995, 1998  . Colon polyps   . PUD (peptic ulcer disease)     a/w minor bleeding in 1990's     Review of Systems  Constitutional: Positive for fatigue. Negative for fever.  Respiratory: Negative for cough and shortness of breath.   Cardiovascular: Negative for chest pain and palpitations.  Gastrointestinal: Negative for nausea and vomiting.  Psychiatric/Behavioral: Negative for dysphoric mood. The patient is not nervous/anxious.        Objective:   Physical Exam BP 120/70  Pulse 75  Temp(Src) 98.4 F (36.9 C) (Oral)  Wt 147 lb 12.8 oz (67.042 kg)  BMI 29.84 kg/m2  SpO2 95% Wt Readings from Last 3 Encounters:  04/06/13 147 lb 12.8 oz  (67.042 kg)  01/25/13 147 lb (66.679 kg)  10/07/12 142 lb 6.4 oz (64.592 kg)   Constitutional: She appears chronically ill but well-nourished. No distress at rest.  spouse at side Neck: Normal range of motion. Neck supple. No JVD present. No thyromegaly present.  Cardiovascular: Normal rate, regular rhythm and normal heart sounds.  No murmur heard. no BLE edema Pulmonary/Chest:  No increase work of breathing at rest, good effort but diminished breath sounds bilaterally. No active respiratory distress. She has no wheezes.  Abdomen: protuberant with slight distention but +BS, no r/g - NT Psychiatric: She has a normal mood and affect, very pleasant. Her behavior is normal. Judgment and thought content normal.   Lab Results  Component Value Date   WBC 10.1 12/03/2012   HGB 11.5* 12/03/2012   HCT 36.2 12/03/2012   PLT 143* 12/03/2012   GLUCOSE 105* 01/25/2013   CHOL 234* 01/25/2013   TRIG 199.0* 01/25/2013   HDL 57.20 01/25/2013   LDLDIRECT 144.2 01/25/2013   LDLCALC 116* 07/27/2012   ALT 17 01/25/2013   AST 17 01/25/2013   NA 140 01/25/2013   K 4.0 01/25/2013   CL 96 01/25/2013   CREATININE 1.0 01/25/2013   BUN 14 01/25/2013   CO2 34* 01/25/2013   TSH 2.86 11/20/2011   INR 0.92 12/03/2012       Assessment & Plan:  See problem list. Medications and  labs reviewed today.  Time spent with pt/family today 30 minutes, greater than 50% time spent counseling patient on hosp for SBO, COPD R shoulder fx and medication review. Also review of interval and hospital records

## 2013-04-06 NOTE — Assessment & Plan Note (Signed)
Right shoulder fracture and right pelvic fracture June 2014 Complicated by chronic prednisone for end-stage COPD Continue working on mobility and balance with physical therapy No plans for specific bisphosphate or other therapy given end-stage hospice diagnosis

## 2013-04-08 ENCOUNTER — Ambulatory Visit: Payer: Medicare Other | Admitting: Internal Medicine

## 2013-04-11 ENCOUNTER — Encounter: Payer: Self-pay | Admitting: Internal Medicine

## 2013-05-04 ENCOUNTER — Other Ambulatory Visit: Payer: Self-pay

## 2013-05-15 ENCOUNTER — Encounter (HOSPITAL_COMMUNITY): Payer: Self-pay | Admitting: Emergency Medicine

## 2013-05-15 ENCOUNTER — Emergency Department (HOSPITAL_COMMUNITY)

## 2013-05-15 ENCOUNTER — Emergency Department (HOSPITAL_COMMUNITY)
Admission: EM | Admit: 2013-05-15 | Discharge: 2013-05-15 | Disposition: A | Discharge status: Skilled Nursing Facility | Attending: Emergency Medicine | Admitting: Emergency Medicine

## 2013-05-15 DIAGNOSIS — S46909A Unspecified injury of unspecified muscle, fascia and tendon at shoulder and upper arm level, unspecified arm, initial encounter: Secondary | ICD-10-CM | POA: Insufficient documentation

## 2013-05-15 DIAGNOSIS — E785 Hyperlipidemia, unspecified: Secondary | ICD-10-CM | POA: Insufficient documentation

## 2013-05-15 DIAGNOSIS — I2510 Atherosclerotic heart disease of native coronary artery without angina pectoris: Secondary | ICD-10-CM | POA: Insufficient documentation

## 2013-05-15 DIAGNOSIS — Y9301 Activity, walking, marching and hiking: Secondary | ICD-10-CM | POA: Insufficient documentation

## 2013-05-15 DIAGNOSIS — F329 Major depressive disorder, single episode, unspecified: Secondary | ICD-10-CM | POA: Insufficient documentation

## 2013-05-15 DIAGNOSIS — W19XXXA Unspecified fall, initial encounter: Secondary | ICD-10-CM

## 2013-05-15 DIAGNOSIS — M545 Low back pain: Secondary | ICD-10-CM

## 2013-05-15 DIAGNOSIS — Z79899 Other long term (current) drug therapy: Secondary | ICD-10-CM | POA: Insufficient documentation

## 2013-05-15 DIAGNOSIS — Z8673 Personal history of transient ischemic attack (TIA), and cerebral infarction without residual deficits: Secondary | ICD-10-CM | POA: Insufficient documentation

## 2013-05-15 DIAGNOSIS — Z860100 Personal history of colon polyps, unspecified: Secondary | ICD-10-CM | POA: Insufficient documentation

## 2013-05-15 DIAGNOSIS — S0003XA Contusion of scalp, initial encounter: Secondary | ICD-10-CM | POA: Insufficient documentation

## 2013-05-15 DIAGNOSIS — IMO0002 Reserved for concepts with insufficient information to code with codable children: Secondary | ICD-10-CM | POA: Insufficient documentation

## 2013-05-15 DIAGNOSIS — M810 Age-related osteoporosis without current pathological fracture: Secondary | ICD-10-CM | POA: Insufficient documentation

## 2013-05-15 DIAGNOSIS — F3289 Other specified depressive episodes: Secondary | ICD-10-CM | POA: Insufficient documentation

## 2013-05-15 DIAGNOSIS — J449 Chronic obstructive pulmonary disease, unspecified: Secondary | ICD-10-CM | POA: Insufficient documentation

## 2013-05-15 DIAGNOSIS — S0083XA Contusion of other part of head, initial encounter: Secondary | ICD-10-CM

## 2013-05-15 DIAGNOSIS — K219 Gastro-esophageal reflux disease without esophagitis: Secondary | ICD-10-CM | POA: Insufficient documentation

## 2013-05-15 DIAGNOSIS — J4489 Other specified chronic obstructive pulmonary disease: Secondary | ICD-10-CM | POA: Insufficient documentation

## 2013-05-15 DIAGNOSIS — Z8711 Personal history of peptic ulcer disease: Secondary | ICD-10-CM | POA: Insufficient documentation

## 2013-05-15 DIAGNOSIS — Z862 Personal history of diseases of the blood and blood-forming organs and certain disorders involving the immune mechanism: Secondary | ICD-10-CM | POA: Insufficient documentation

## 2013-05-15 DIAGNOSIS — Z8781 Personal history of (healed) traumatic fracture: Secondary | ICD-10-CM | POA: Insufficient documentation

## 2013-05-15 DIAGNOSIS — I509 Heart failure, unspecified: Secondary | ICD-10-CM | POA: Insufficient documentation

## 2013-05-15 DIAGNOSIS — Z9981 Dependence on supplemental oxygen: Secondary | ICD-10-CM | POA: Insufficient documentation

## 2013-05-15 DIAGNOSIS — W1809XA Striking against other object with subsequent fall, initial encounter: Secondary | ICD-10-CM | POA: Insufficient documentation

## 2013-05-15 DIAGNOSIS — Z87891 Personal history of nicotine dependence: Secondary | ICD-10-CM | POA: Insufficient documentation

## 2013-05-15 DIAGNOSIS — S8990XA Unspecified injury of unspecified lower leg, initial encounter: Secondary | ICD-10-CM | POA: Insufficient documentation

## 2013-05-15 DIAGNOSIS — I10 Essential (primary) hypertension: Secondary | ICD-10-CM | POA: Insufficient documentation

## 2013-05-15 DIAGNOSIS — S4980XA Other specified injuries of shoulder and upper arm, unspecified arm, initial encounter: Secondary | ICD-10-CM | POA: Insufficient documentation

## 2013-05-15 DIAGNOSIS — Y9210 Unspecified residential institution as the place of occurrence of the external cause: Secondary | ICD-10-CM | POA: Insufficient documentation

## 2013-05-15 DIAGNOSIS — Z9889 Other specified postprocedural states: Secondary | ICD-10-CM | POA: Insufficient documentation

## 2013-05-15 MED ORDER — MORPHINE SULFATE 4 MG/ML IJ SOLN
6.0000 mg | Freq: Once | INTRAMUSCULAR | Status: AC
  Administered 2013-05-15: 6 mg via INTRAMUSCULAR
  Filled 2013-05-15: qty 2

## 2013-05-15 NOTE — ED Provider Notes (Signed)
I reviewed her x-rays. Clinically she does not have a dislocated shoulder. Her discomfort in her back is lumbosacral. Is not around her upper lumbar area of the L2 vertebrae. I do not think her findings are acute fracture from today. She is anicteric hospice. She is in a assisted-living facility. Her nurse contacted the facility. They can have daily nursing and PT check on her over the next few days. She is admitted to have further studies done. I think, continued home hospice care with her for her chronic pain is all that is indicated.  Roney Marion, MD 05/15/13 [phone]

## 2013-05-15 NOTE — ED Notes (Signed)
[facility], spoke with [employer] updated on pt's status and d/c instructions. She reports she is going to called her boss and then have her call me back.

## 2013-05-15 NOTE — ED Notes (Signed)
Pt returned from radiology.

## 2013-05-15 NOTE — ED Notes (Signed)
Per [facility] pt fell from standing position trying to get to the BR today. Pt came from [facility] and is DNR. Pt fell to right side and hit right side of face and has pain to right side of body. No obvious injury or deformity noted. No LOC. Pt had recent right shoulder fracture. Pt has full ROM. Started c/o HA en route to hospital.

## 2013-05-15 NOTE — ED Provider Notes (Signed)
CSN: [id number]     Arrival date & time 05/15/13  1423 History   First MD Initiated Contact with Patient 05/15/13 1441     Chief Complaint  Patient presents with  . Fall   (Consider location/radiation/quality/duration/timing/severity/associated sxs/prior Treatment) HPI Comments: Patient with h/o end-stage COPD, on hospice -- presents after a fall this afternoon. Patient was walking with her walker when she fell forward onto the right side of her face, shoulder, and lower extremity. Patient remembers all events and states that she did not lose consciousness. EMS was called. She did not feel lightheaded or dizzy prior to falling. No chest pain or SOB. Patient currently complains mainly of lower back pain does not radiate into her lower extremities. She also has some right shoulder pain and a history of previous right shoulder fracture. She complains of right eye swelling, right facial pain with movement of her jaw, and neck pain. No weakness and numbness in her upper or lower extremities. She denies blurry vision. No nausea or vomiting. Patient is not currently on any blood thinning medications. She takes morphine for chronic pain. She denies hip pain. No treatments prior to arrival. The onset of this condition was acute. The course is constant. Aggravating factors: movement. Alleviating factors: none.    Patient is a 79 y.o. female presenting with fall. The history is provided by the patient.  Fall Associated symptoms include headaches and neck pain. Pertinent negatives include no abdominal pain, chest pain, fatigue, nausea, numbness, sore throat, vomiting or weakness.    Past Medical History  Diagnosis Date  . Von Willebrand's disease   . CORONARY ARTERY DISEASE   . DIVERTICULOSIS, COLON   . CEREBROVASCULAR ACCIDENT, HX OF 1991  . SMALL BOWEL OBSTRUCTION, HX OF   . CAROTID ENDARTERECTOMY, HX OF 06/23/1992  . COPD     end stage, O2 dep  . OSTEOPOROSIS   . HYPERTENSION   . GERD   .  Depression   . Hyperlipidemia   . Shoulder fracture, right   . Asthma   . CHF (congestive heart failure)   . Diverticulitis large intestine 1995, 1998  . Colon polyps   . PUD (peptic ulcer disease)     a/w minor bleeding in 1990's   Past Surgical History  Procedure Laterality Date  . Cholecystectomy  1971    reoperated on due to starch peritonitis.   Marland Kitchen Appendectomy  1965  . Exploratory laparotomy  1965    peritonitis due to ruptured appx  . Carotid endarterectomy  1994  . Exploratory laparotomy  1971    due to post chole starch peritonitis.   . Vocal cord polypectomy     Family History  Problem Relation Age of Onset  . Arthritis Mother   . Arthritis Father   . Heart disease Father   . Hyperlipidemia Father   . Hypertension Father    History  Substance Use Topics  . Smoking status: Former Smoker -- 1.50 packs/day for 40 years    Quit date: 06/23/1988  . Smokeless tobacco: Never Used  . Alcohol Use: Yes     Comment: 2 glass each night   OB History   Grav Para Term Preterm Abortions TAB SAB Ect Mult Living                 Review of Systems  Constitutional: Negative for fatigue.  HENT: Positive for facial swelling. Negative for ear pain, sore throat and tinnitus.   Eyes: Negative for photophobia, pain and visual  disturbance.  Respiratory: Negative for shortness of breath.   Cardiovascular: Negative for chest pain, palpitations and leg swelling.  Gastrointestinal: Negative for nausea, vomiting and abdominal pain.  Musculoskeletal: Positive for back pain and neck pain. Negative for gait problem.  Skin: Positive for wound.  Neurological: Positive for headaches. Negative for dizziness, weakness, light-headedness and numbness.  Psychiatric/Behavioral: Negative for confusion and decreased concentration.    Allergies  Aspirin; Ciprofloxacin; Hctz; Klotrix; Other; Oxycodone; Pravachol; Sulfa antibiotics; and Tetracyclines & related  Home Medications   Current  Outpatient Rx  Name  Route  Sig  Dispense  Refill  . acetaminophen (TYLENOL) 500 MG tablet   Oral   Take 1,000 mg by mouth every 6 (six) hours as needed for pain.         Marland Kitchen albuterol (PROVENTIL HFA;VENTOLIN HFA) 108 (90 BASE) MCG/ACT inhaler   Inhalation   Inhale 2 puffs into the lungs every 4 (four) hours as needed for shortness of breath.         . ALPRAZolam (XANAX) 1 MG tablet   Oral   Take 1 tablet (1 mg total) by mouth 3 (three) times daily.   30 tablet   3   . amLODipine (NORVASC) 5 MG tablet   Oral   Take 5 mg by mouth every morning. Hold if SBP < 100.         . furosemide (LASIX) 40 MG tablet   Oral   Take 40 mg by mouth every morning.         Marland Kitchen guaiFENesin (MUCINEX) 600 MG 12 hr tablet   Oral   Take 600 mg by mouth 2 (two) times daily.         Marland Kitchen ipratropium (ATROVENT) 0.02 % nebulizer solution      1 nebulized treatment every day at noon AND Q3 hours as needed for dyspnea   75 mL   12   . isosorbide mononitrate (IMDUR) 60 MG 24 hr tablet   Oral   Take 60 mg by mouth every morning.         Marland Kitchen LORazepam (ATIVAN) 0.5 MG tablet   Oral   Take 1 tablet (0.5 mg total) by mouth every 6 (six) hours as needed (anxiety before physical therapy).   30 tablet   1   . lovastatin (MEVACOR) 40 MG tablet   Oral   Take 1 tablet (40 mg total) by mouth at bedtime.   90 tablet   3   . metoprolol succinate (TOPROL-XL) 25 MG 24 hr tablet   Oral   Take 25 mg by mouth 2 (two) times daily.         Marland Kitchen morphine 20 MG/5ML solution   Oral   Take 5 mg by mouth every 2 (two) hours as needed for pain.         . nitroGLYCERIN (NITROSTAT) 0.4 MG SL tablet   Sublingual   Place 0.4 mg under the tongue every 5 (five) minutes as needed for chest pain.         Marland Kitchen omeprazole (PRILOSEC) 20 MG capsule   Oral   Take 20 mg by mouth every morning.         Marland Kitchen PARoxetine (PAXIL) 20 MG tablet   Oral   Take 1 tablet (20 mg total) by mouth every morning.   30 tablet   11    . polyethylene glycol (MIRALAX / GLYCOLAX) packet   Oral   Take 17 g by mouth every morning.          Marland Kitchen  polyvinyl alcohol (LIQUIFILM TEARS) 1.4 % ophthalmic solution   Both Eyes   Place 2 drops into both eyes daily as needed (For dry eyes.).         Marland Kitchen potassium chloride SA (K-DUR,KLOR-CON) 20 MEQ tablet   Oral   Take 20 mEq by mouth every morning.         . predniSONE (DELTASONE) 10 MG tablet   Oral   Take 10 mg by mouth every morning.          . prochlorperazine (COMPAZINE) 10 MG tablet   Oral   Take 10 mg by mouth daily.          Marland Kitchen senna-docusate (SENOKOT-S) 8.6-50 MG per tablet   Oral   Take 1 tablet by mouth at bedtime.         . sodium chloride (OCEAN) 0.65 % nasal spray   Nasal   Place 1 spray into the nose as needed for congestion.         Marland Kitchen tiotropium (SPIRIVA) 18 MCG inhalation capsule   Inhalation   Place 18 mcg into inhaler and inhale daily.         . valsartan (DIOVAN) 40 MG tablet   Oral   Take 40 mg by mouth daily.          BP 144/61  Pulse 79  Temp(Src) 98.4 F (36.9 C) (Oral)  Resp 20  Ht 4\' 11"  (1.499 m)  Wt 146 lb (66.225 kg)  BMI 29.47 kg/m2  SpO2 98% Physical Exam  Nursing note and vitals reviewed. Constitutional: She is oriented to person, place, and time. She appears well-developed and well-nourished.  HENT:  Head: Normocephalic. Head is without raccoon's eyes and without Battle's sign.  Right Ear: Tympanic membrane, external ear and ear canal normal. No hemotympanum.  Left Ear: Tympanic membrane, external ear and ear canal normal. No hemotympanum.  Nose: Nose normal. No nasal septal hematoma.  Mouth/Throat: Uvula is midline, oropharynx is clear and moist and mucous membranes are normal.  No scalp hematoma. Swelling around L eye but patient can open and move eye well. Mild TMJ pain with movement of jaw. No pain over zygoma. L face nml.   Eyes: Conjunctivae, EOM and lids are normal. Pupils are equal, round, and reactive  to light. Right eye exhibits no nystagmus. Left eye exhibits no nystagmus.  No visible hyphema noted  Neck: Normal range of motion. Neck supple.  Cardiovascular: Normal rate and regular rhythm.   No murmur heard. Pulmonary/Chest: Effort normal and breath sounds normal. She exhibits no tenderness.  Abdominal: Soft. There is no tenderness.  Musculoskeletal: She exhibits no edema and no tenderness.       Right shoulder: She exhibits normal range of motion and no bony tenderness. Tenderness: with significant adduction, flexion.       Left shoulder: Normal.       Right elbow: She exhibits no swelling, no effusion and no laceration (small abrasion on elbow). No tenderness found.       Left elbow: Normal.       Right wrist: Normal.       Left wrist: Normal.       Right hip: Normal.       Left hip: Normal.       Right knee: Normal.       Left knee: Normal.       Right ankle: Normal.       Left ankle: Normal.       Cervical  back: She exhibits normal range of motion and no bony tenderness.       Thoracic back: She exhibits no tenderness and no bony tenderness.       Lumbar back: She exhibits no bony tenderness.  Neurological: She is alert and oriented to person, place, and time. She has normal strength and normal reflexes. No cranial nerve deficit or sensory deficit. Coordination normal. GCS eye subscore is 4. GCS verbal subscore is 5. GCS motor subscore is 6.  Skin: Skin is warm and dry.  Psychiatric: She has a normal mood and affect.    ED Course  Procedures (including critical care time) Labs Review Labs Reviewed - No data to display Imaging Review No results found.  EKG Interpretation   None      2:40 PM  Patient seen and examined. Work-up initiated. Medications ordered.   Vital signs reviewed and are as follows: Filed Vitals:   05/15/13 1515  BP:   Pulse: 79  Temp:   Resp:   BP 144/61  Pulse 79  Temp(Src) 98.4 F (36.9 C) (Oral)  Resp 20  Ht 4\' 11"  (1.499 m)  Wt 146  lb (66.225 kg)  BMI 29.47 kg/m2  SpO2 98%  2:55 PM D/w Dr. Fayrene Fearing who will see.   MDM   1. Fall, initial encounter   2. Facial contusion, initial encounter   3. Low back pain    Pending eval for remainder of eval.    Renne Crigler, PA-C 05/16/13 1613

## 2013-05-15 NOTE — ED Notes (Signed)
Attempted to call Hospice to update of pt's status and see if they would be able to come see the pt an extra time this week.

## 2013-05-18 NOTE — ED Provider Notes (Signed)
Medical screening examination/treatment/procedure(s) were performed by non-physician practitioner and as supervising physician I was immediately available for consultation/collaboration.  EKG Interpretation   None         Mystery Schrupp J Winda Summerall, MD 05/18/13 0745 

## 2013-05-30 ENCOUNTER — Ambulatory Visit: Payer: Medicare Other | Admitting: Cardiology

## 2013-05-30 ENCOUNTER — Other Ambulatory Visit: Payer: Medicare Other

## 2013-06-06 ENCOUNTER — Other Ambulatory Visit: Payer: Self-pay | Admitting: *Deleted

## 2013-06-06 MED ORDER — OLMESARTAN MEDOXOMIL 20 MG PO TABS: 20.0000 mg | ORAL_TABLET | Freq: Every day | ORAL | Status: AC

## 2013-10-05 ENCOUNTER — Ambulatory Visit: Admitting: Internal Medicine

## 2013-12-09 ENCOUNTER — Telehealth: Payer: Self-pay

## 2013-12-09 NOTE — Telephone Encounter (Signed)
[facility]

## 2013-12-21 DEATH — deceased
